# Patient Record
Sex: Female | Born: 1945
Health system: Southern US, Community
[De-identification: ages and names within clinical notes are randomized; demographics above are authoritative.]

## PROBLEM LIST (undated history)

## (undated) DIAGNOSIS — H9190 Unspecified hearing loss, unspecified ear: Secondary | ICD-10-CM

## (undated) DIAGNOSIS — H269 Unspecified cataract: Secondary | ICD-10-CM

## (undated) DIAGNOSIS — E785 Hyperlipidemia, unspecified: Secondary | ICD-10-CM

## (undated) DIAGNOSIS — M810 Age-related osteoporosis without current pathological fracture: Secondary | ICD-10-CM

## (undated) DIAGNOSIS — T7840XA Allergy, unspecified, initial encounter: Secondary | ICD-10-CM

## (undated) DIAGNOSIS — T8859XA Other complications of anesthesia, initial encounter: Secondary | ICD-10-CM

## (undated) DIAGNOSIS — M1990 Unspecified osteoarthritis, unspecified site: Secondary | ICD-10-CM

## (undated) HISTORY — PX: TUBAL LIGATION: SHX77

## (undated) HISTORY — DX: Unspecified cataract: H26.9

## (undated) HISTORY — DX: Allergy, unspecified, initial encounter: T78.40XA

## (undated) HISTORY — DX: Hyperlipidemia, unspecified: E78.5

## (undated) HISTORY — PX: TOTAL KNEE ARTHROPLASTY: SHX125

## (undated) HISTORY — PX: JOINT REPLACEMENT: SHX530

## (undated) HISTORY — PX: COLONOSCOPY: SHX174

## (undated) HISTORY — PX: EYE SURGERY: SHX253

## (undated) HISTORY — PX: CHOLECYSTECTOMY: SHX55

## (undated) HISTORY — PX: KNEE SURGERY: SHX244

---

## 1999-11-22 ENCOUNTER — Encounter: Admission: RE | Admit: 1999-11-22 | Discharge: 1999-11-22 | Payer: Self-pay | Admitting: Obstetrics and Gynecology

## 1999-11-22 ENCOUNTER — Encounter: Payer: Self-pay | Admitting: Obstetrics and Gynecology

## 2000-08-21 ENCOUNTER — Encounter: Payer: Self-pay | Admitting: Family Medicine

## 2000-08-21 ENCOUNTER — Encounter: Admission: RE | Admit: 2000-08-21 | Discharge: 2000-08-21 | Payer: Self-pay | Admitting: Family Medicine

## 2000-11-21 ENCOUNTER — Encounter: Payer: Self-pay | Admitting: Obstetrics and Gynecology

## 2000-11-21 ENCOUNTER — Encounter: Admission: RE | Admit: 2000-11-21 | Discharge: 2000-11-21 | Payer: Self-pay | Admitting: Obstetrics and Gynecology

## 2000-11-25 ENCOUNTER — Encounter: Admission: RE | Admit: 2000-11-25 | Discharge: 2000-11-25 | Payer: Self-pay | Admitting: Obstetrics and Gynecology

## 2000-11-25 ENCOUNTER — Encounter: Payer: Self-pay | Admitting: Obstetrics and Gynecology

## 2001-11-27 ENCOUNTER — Encounter: Payer: Self-pay | Admitting: Obstetrics and Gynecology

## 2001-11-27 ENCOUNTER — Encounter: Admission: RE | Admit: 2001-11-27 | Discharge: 2001-11-27 | Payer: Self-pay | Admitting: Obstetrics and Gynecology

## 2002-12-01 ENCOUNTER — Encounter: Payer: Self-pay | Admitting: Obstetrics and Gynecology

## 2002-12-01 ENCOUNTER — Encounter: Admission: RE | Admit: 2002-12-01 | Discharge: 2002-12-01 | Payer: Self-pay | Admitting: Obstetrics and Gynecology

## 2002-12-09 ENCOUNTER — Encounter: Payer: Self-pay | Admitting: Family Medicine

## 2002-12-09 ENCOUNTER — Encounter: Admission: RE | Admit: 2002-12-09 | Discharge: 2002-12-09 | Payer: Self-pay | Admitting: Family Medicine

## 2003-11-25 ENCOUNTER — Emergency Department (HOSPITAL_COMMUNITY): Admission: EM | Admit: 2003-11-25 | Discharge: 2003-11-25 | Payer: Self-pay | Admitting: Emergency Medicine

## 2004-01-11 ENCOUNTER — Encounter: Admission: RE | Admit: 2004-01-11 | Discharge: 2004-01-11 | Payer: Self-pay | Admitting: Obstetrics and Gynecology

## 2004-04-14 ENCOUNTER — Ambulatory Visit: Payer: Self-pay | Admitting: Family Medicine

## 2004-06-30 ENCOUNTER — Ambulatory Visit: Payer: Self-pay | Admitting: Family Medicine

## 2005-01-17 ENCOUNTER — Encounter: Admission: RE | Admit: 2005-01-17 | Discharge: 2005-01-17 | Payer: Self-pay | Admitting: Obstetrics and Gynecology

## 2005-03-15 ENCOUNTER — Ambulatory Visit: Payer: Self-pay | Admitting: Family Medicine

## 2005-07-27 ENCOUNTER — Ambulatory Visit: Payer: Self-pay | Admitting: Family Medicine

## 2005-11-30 ENCOUNTER — Ambulatory Visit: Payer: Self-pay | Admitting: Family Medicine

## 2006-01-18 ENCOUNTER — Encounter: Admission: RE | Admit: 2006-01-18 | Discharge: 2006-01-18 | Payer: Self-pay | Admitting: Obstetrics and Gynecology

## 2006-03-20 ENCOUNTER — Ambulatory Visit: Payer: Self-pay | Admitting: Family Medicine

## 2006-07-16 ENCOUNTER — Ambulatory Visit: Payer: Self-pay | Admitting: Family Medicine

## 2006-07-16 LAB — CONVERTED CEMR LAB
ALT: 15 units/L (ref 0–40)
AST: 17 units/L (ref 0–37)
Albumin: 3.5 g/dL (ref 3.5–5.2)
Alkaline Phosphatase: 42 units/L (ref 39–117)
BUN: 19 mg/dL (ref 6–23)
Basophils Absolute: 0 10*3/uL (ref 0.0–0.1)
Basophils Relative: 0.3 % (ref 0.0–1.0)
Bilirubin, Direct: 0.1 mg/dL (ref 0.0–0.3)
CO2: 28 meq/L (ref 19–32)
Calcium: 9.5 mg/dL (ref 8.4–10.5)
Chloride: 108 meq/L (ref 96–112)
Cholesterol: 190 mg/dL (ref 0–200)
Creatinine, Ser: 0.6 mg/dL (ref 0.4–1.2)
Eosinophils Absolute: 0.1 10*3/uL (ref 0.0–0.6)
Eosinophils Relative: 2.3 % (ref 0.0–5.0)
GFR calc Af Amer: 132 mL/min
GFR calc non Af Amer: 109 mL/min
Glucose, Bld: 97 mg/dL (ref 70–99)
HCT: 38 % (ref 36.0–46.0)
HDL: 54.1 mg/dL (ref >39.0)
Hemoglobin: 13.1 g/dL (ref 12.0–15.0)
LDL Cholesterol: 126 mg/dL — ABNORMAL HIGH (ref 0–99)
Lymphocytes Relative: 31.7 % (ref 12.0–46.0)
MCHC: 34.6 g/dL (ref 30.0–36.0)
MCV: 90.8 fL (ref 78.0–100.0)
Monocytes Absolute: 0.4 10*3/uL (ref 0.2–0.7)
Monocytes Relative: 8.8 % (ref 3.0–11.0)
Neutro Abs: 2.4 10*3/uL (ref 1.4–7.7)
Neutrophils Relative %: 56.9 % (ref 43.0–77.0)
Platelets: 235 10*3/uL (ref 150–400)
Potassium: 4.4 meq/L (ref 3.5–5.1)
RBC: 4.18 M/uL (ref 3.87–5.11)
RDW: 12.9 % (ref 11.5–14.6)
Sodium: 141 meq/L (ref 135–145)
TSH: 1.2 microintl units/mL (ref 0.35–5.50)
Total Bilirubin: 0.6 mg/dL (ref 0.3–1.2)
Total CHOL/HDL Ratio: 3.5
Total Protein: 6.3 g/dL (ref 6.0–8.3)
Triglycerides: 47 mg/dL (ref 0–149)
VLDL: 9 mg/dL (ref 0–40)
WBC: 4.2 10*3/uL — ABNORMAL LOW (ref 4.5–10.5)

## 2006-11-11 ENCOUNTER — Ambulatory Visit: Payer: Self-pay | Admitting: Family Medicine

## 2007-03-17 ENCOUNTER — Encounter: Admission: RE | Admit: 2007-03-17 | Discharge: 2007-03-17 | Payer: Self-pay | Admitting: Obstetrics and Gynecology

## 2007-07-21 ENCOUNTER — Ambulatory Visit: Payer: Self-pay | Admitting: Family Medicine

## 2007-07-25 LAB — CONVERTED CEMR LAB
ALT: 15 units/L (ref 0–35)
AST: 18 units/L (ref 0–37)
Albumin: 3.9 g/dL (ref 3.5–5.2)
Alkaline Phosphatase: 44 units/L (ref 39–117)
BUN: 16 mg/dL (ref 6–23)
Basophils Absolute: 0 10*3/uL (ref 0.0–0.1)
Basophils Relative: 0.5 % (ref 0.0–1.0)
Bilirubin, Direct: 0.1 mg/dL (ref 0.0–0.3)
CO2: 28 meq/L (ref 19–32)
Calcium: 10.2 mg/dL (ref 8.4–10.5)
Chloride: 109 meq/L (ref 96–112)
Cholesterol: 215 mg/dL (ref 0–200)
Creatinine, Ser: 0.8 mg/dL (ref 0.4–1.2)
Direct LDL: 132.3 mg/dL
Eosinophils Absolute: 0.1 10*3/uL (ref 0.0–0.6)
Eosinophils Relative: 2.5 % (ref 0.0–5.0)
GFR calc Af Amer: 94 mL/min
GFR calc non Af Amer: 78 mL/min
Glucose, Bld: 91 mg/dL (ref 70–99)
HCT: 38.2 % (ref 36.0–46.0)
HDL: 59.4 mg/dL (ref >39.0)
Hemoglobin: 12.8 g/dL (ref 12.0–15.0)
Lymphocytes Relative: 30.1 % (ref 12.0–46.0)
MCHC: 33.6 g/dL (ref 30.0–36.0)
MCV: 90.8 fL (ref 78.0–100.0)
Monocytes Absolute: 0.4 10*3/uL (ref 0.2–0.7)
Monocytes Relative: 8.3 % (ref 3.0–11.0)
Neutro Abs: 3.1 10*3/uL (ref 1.4–7.7)
Neutrophils Relative %: 58.6 % (ref 43.0–77.0)
Platelets: 244 10*3/uL (ref 150–400)
Potassium: 4.2 meq/L (ref 3.5–5.1)
RBC: 4.2 M/uL (ref 3.87–5.11)
RDW: 13.6 % (ref 11.5–14.6)
Sodium: 142 meq/L (ref 135–145)
TSH: 1.79 microintl units/mL (ref 0.35–5.50)
Total Bilirubin: 0.7 mg/dL (ref 0.3–1.2)
Total CHOL/HDL Ratio: 3.6
Total Protein: 7 g/dL (ref 6.0–8.3)
Triglycerides: 88 mg/dL (ref 0–149)
VLDL: 18 mg/dL (ref 0–40)
WBC: 5.1 10*3/uL (ref 4.5–10.5)

## 2007-11-03 ENCOUNTER — Ambulatory Visit: Payer: Self-pay | Admitting: Family Medicine

## 2007-11-05 ENCOUNTER — Telehealth (INDEPENDENT_AMBULATORY_CARE_PROVIDER_SITE_OTHER): Payer: Self-pay | Admitting: Internal Medicine

## 2008-03-11 ENCOUNTER — Ambulatory Visit: Payer: Self-pay | Admitting: Family Medicine

## 2008-03-16 ENCOUNTER — Telehealth (INDEPENDENT_AMBULATORY_CARE_PROVIDER_SITE_OTHER): Payer: Self-pay | Admitting: Internal Medicine

## 2008-03-18 ENCOUNTER — Encounter: Admission: RE | Admit: 2008-03-18 | Discharge: 2008-03-18 | Payer: Self-pay | Admitting: Obstetrics and Gynecology

## 2008-03-25 ENCOUNTER — Ambulatory Visit: Payer: Self-pay | Admitting: Family Medicine

## 2008-03-31 ENCOUNTER — Encounter: Admission: RE | Admit: 2008-03-31 | Discharge: 2008-03-31 | Payer: Self-pay | Admitting: Obstetrics and Gynecology

## 2008-04-12 ENCOUNTER — Ambulatory Visit: Payer: Self-pay | Admitting: Internal Medicine

## 2008-08-25 ENCOUNTER — Ambulatory Visit: Payer: Self-pay | Admitting: Family Medicine

## 2008-08-25 DIAGNOSIS — J309 Allergic rhinitis, unspecified: Secondary | ICD-10-CM | POA: Insufficient documentation

## 2008-08-26 LAB — CONVERTED CEMR LAB
BUN: 9 mg/dL (ref 6–23)
CO2: 29 meq/L (ref 19–32)
Calcium: 10 mg/dL (ref 8.4–10.5)
Chloride: 109 meq/L (ref 96–112)
Cholesterol: 218 mg/dL — ABNORMAL HIGH (ref 0–200)
Creatinine, Ser: 0.8 mg/dL (ref 0.4–1.2)
Direct LDL: 134.8 mg/dL
GFR calc non Af Amer: 77.37 mL/min (ref >60)
Glucose, Bld: 97 mg/dL (ref 70–99)
HDL: 48.7 mg/dL (ref >39.00)
Potassium: 4.4 meq/L (ref 3.5–5.1)
Sodium: 142 meq/L (ref 135–145)
TSH: 1.43 microintl units/mL (ref 0.35–5.50)
Total CHOL/HDL Ratio: 4
Triglycerides: 98 mg/dL (ref 0.0–149.0)
VLDL: 19.6 mg/dL (ref 0.0–40.0)

## 2008-12-07 ENCOUNTER — Ambulatory Visit: Payer: Self-pay | Admitting: Family Medicine

## 2009-03-24 ENCOUNTER — Encounter: Payer: Self-pay | Admitting: Family Medicine

## 2009-03-24 ENCOUNTER — Encounter: Admission: RE | Admit: 2009-03-24 | Discharge: 2009-03-24 | Payer: Self-pay | Admitting: Obstetrics and Gynecology

## 2009-09-11 LAB — CONVERTED CEMR LAB: Pap Smear: NORMAL

## 2010-03-09 ENCOUNTER — Ambulatory Visit: Payer: Self-pay | Admitting: Family Medicine

## 2010-03-14 LAB — CONVERTED CEMR LAB
Alkaline Phosphatase: 49 units/L (ref 39–117)
Basophils Absolute: 0 10*3/uL (ref 0.0–0.1)
Basophils Relative: 0.8 % (ref 0.0–3.0)
CO2: 27 meq/L (ref 19–32)
Calcium: 9.7 mg/dL (ref 8.4–10.5)
Chloride: 99 meq/L (ref 96–112)
Creatinine, Ser: 0.9 mg/dL (ref 0.4–1.2)
Direct LDL: 156.3 mg/dL
Eosinophils Absolute: 0.2 10*3/uL (ref 0.0–0.7)
Eosinophils Relative: 3.3 % (ref 0.0–5.0)
GFR calc non Af Amer: 69.88 mL/min (ref >60)
Glucose, Bld: 96 mg/dL (ref 70–99)
Hemoglobin: 13.5 g/dL (ref 12.0–15.0)
Lymphs Abs: 1.6 10*3/uL (ref 0.7–4.0)
MCV: 93 fL (ref 78.0–100.0)
Monocytes Relative: 8.3 % (ref 3.0–12.0)
Neutro Abs: 2.9 10*3/uL (ref 1.4–7.7)
Neutrophils Relative %: 56.7 % (ref 43.0–77.0)
RBC: 4.26 M/uL (ref 3.87–5.11)
Sodium: 134 meq/L — ABNORMAL LOW (ref 135–145)
Total CHOL/HDL Ratio: 5
WBC: 5.1 10*3/uL (ref 4.5–10.5)

## 2010-03-17 ENCOUNTER — Ambulatory Visit: Payer: Self-pay | Admitting: Family Medicine

## 2010-03-17 ENCOUNTER — Encounter: Payer: Self-pay | Admitting: Internal Medicine

## 2010-03-17 ENCOUNTER — Encounter (INDEPENDENT_AMBULATORY_CARE_PROVIDER_SITE_OTHER): Payer: Self-pay | Admitting: *Deleted

## 2010-03-17 DIAGNOSIS — M810 Age-related osteoporosis without current pathological fracture: Secondary | ICD-10-CM | POA: Insufficient documentation

## 2010-03-20 ENCOUNTER — Telehealth: Payer: Self-pay | Admitting: Internal Medicine

## 2010-03-27 ENCOUNTER — Encounter: Admission: RE | Admit: 2010-03-27 | Discharge: 2010-03-27 | Payer: Self-pay | Admitting: Family Medicine

## 2010-03-27 LAB — HM MAMMOGRAPHY

## 2010-04-14 ENCOUNTER — Ambulatory Visit: Payer: Self-pay | Admitting: Sports Medicine

## 2010-04-14 DIAGNOSIS — Q667 Congenital pes cavus, unspecified foot: Secondary | ICD-10-CM | POA: Insufficient documentation

## 2010-04-14 DIAGNOSIS — M7750 Other enthesopathy of unspecified foot: Secondary | ICD-10-CM | POA: Insufficient documentation

## 2010-04-14 DIAGNOSIS — E785 Hyperlipidemia, unspecified: Secondary | ICD-10-CM | POA: Insufficient documentation

## 2010-04-14 DIAGNOSIS — G5760 Lesion of plantar nerve, unspecified lower limb: Secondary | ICD-10-CM | POA: Insufficient documentation

## 2010-04-27 ENCOUNTER — Encounter (INDEPENDENT_AMBULATORY_CARE_PROVIDER_SITE_OTHER): Payer: Self-pay | Admitting: *Deleted

## 2010-04-28 ENCOUNTER — Ambulatory Visit: Payer: Self-pay | Admitting: Internal Medicine

## 2010-05-11 ENCOUNTER — Ambulatory Visit: Payer: Self-pay | Admitting: Internal Medicine

## 2010-05-22 ENCOUNTER — Ambulatory Visit
Admission: RE | Admit: 2010-05-22 | Discharge: 2010-05-22 | Payer: Self-pay | Source: Home / Self Care | Attending: Sports Medicine | Admitting: Sports Medicine

## 2010-06-04 ENCOUNTER — Encounter: Payer: Self-pay | Admitting: Obstetrics and Gynecology

## 2010-06-13 NOTE — Letter (Signed)
Summary: Generic Letter  [place] [facility]  [address] [place], [place] [address]   Phone: [phone]  Fax: [phone]    03/17/2010     INFINITI HOEFLING [address] [place], [place]  [address]    Dear Ms. ZEMAITIS,   Notify pt that last DXA was 03/2009 last year...Marland Kitchenper reading there has been a statistically INSIGNIFICANT decrease in bone density in in spine and hip.Marland KitchenMarland KitchenMarland KitchenI would recommend continuing fosamax and recheck agin every 2 years..so next in 2012 at [facility].      Sincerely,   Benny Lennert CMA (AAMA)

## 2010-06-13 NOTE — Assessment & Plan Note (Signed)
Summary: cpx and estab from billie/dlo   Vital Signs:  Patient profile:   65 year old female Height:      65 inches Weight:      135.0 pounds BMI:     22.55 Temp:     98.6 degrees F oral Pulse rate:   80 / minute Pulse rhythm:   regular BP sitting:   120 / 70  (left arm) Cuff size:   regular  Vitals Entered By: Benny Lennert CMA Duncan Dull) (March 17, 2010 8:48 AM)  History of Present Illness: Chief complaint chronic health management.    [place] Dr. Elana Alm  (GYN) last 09/2009  High cholesterol.. new development..LDL goal <130. Exercsie 4-5 times a week.   Osteopenia.. has been on fosamax for many years. Not sure when last one was done...will get copy. Last told fosamax may not be helping.Pt already notified per report.  to not use well water with it.  Takes calcium and vit D.  Weight bearing exercise.    Preventive Screening-Counseling & Management  Alcohol-Tobacco     Smoking Status: never  Caffeine-Diet-Exercise     Does Patient Exercise: yes      Drug Use:  no.    Problems Prior to Update: 1)  Allergic Rhinitis  (ICD-477.9) 2)  Dysfunction of Eustachian Tube  (ICD-381.81) 3)  Well Adult Exam  (ICD-V70.0) 4)  Degenerative Joint Disease, Knee  (ICD-715.96) 5)  Bronchitis, Acute With Bronchospasm  (ICD-466.0)  Current Medications (verified): 1)  Zantac 150 Mg  Tabs (Ranitidine Hcl) .... Take 1 Tablet By Mouth Two Times A Day As Needed 2)  Rhinocort Aqua 32 Mcg/act  Susp (Budesonide (Nasal)) .... As Needed 3)  Aspirin 81 Mg  Tbec (Aspirin) .... Take 1 Tablet By Mouth Once A Day 4)  Fosamax 70 Mg  Tabs (Alendronate Sodium) .Marland Kitchen.. 1 Tab Every Week 5)  Fish Oil   Oil (Fish Oil) .... Otc Taking Twice Daily 6)  Antihistamine Decongestant 2.5-60 Mg Tabs (Triprolidine-Pseudoephedrine) .... Otc As Directed. 7)  Aleve 220 Mg Tabs (Naproxen Sodium) .... One Tablet Twice Daily  Allergies: 1)  ! Pcn 2)  ! Percocet 3)  ! Vicodin (Hydrocodone-Acetaminophen)  Past  History:  Past medical, surgical, family and social histories (including risk factors) reviewed, and no changes noted (except as noted below).  Past Surgical History: arthroscopic L knee --sept ,08 Cholecystectomy  Family History: Reviewed history and no changes required. father: valvular disease age 69, high cholesterol mother: CVA, high cholesterol siblings: ? healthy uncle: colon cancer maternal aunt: breast cancer  Social History: Reviewed history from 07/21/2007 and no changes required. 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 CV:  Denies chest pain or discomfort. Resp:  Denies shortness of breath. GI:  Complains of constipation; denies abdominal pain, bloody stools, and diarrhea; Uses stool softners every few days..more constipation as she has gotten older.. GU:  Denies dysuria. Derm:  Denies lesion(s). Psych:  Denies anxiety and depression.  Physical Exam  General:  Well-developed,well-nourished,in no acute distress; alert,appropriate and cooperative throughout examination Ears:  External ear exam shows no significant lesions or deformities.  Otoscopic examination reveals clear canals, tympanic membranes are intact bilaterally without bulging, retraction, inflammation or discharge. Hearing is grossly normal bilaterally. Nose:  External nasal examination shows no deformity or inflammation. Nasal mucosa are  pink and moist without lesions or exudates. Mouth:  Oral mucosa and oropharynx without lesions or exudates.  Teeth in good repair. Neck:  no carotid bruit or thyromegaly no cervical or supraclavicular lymphadenopathy  Lungs:  Normal respiratory effort, chest expands symmetrically. Lungs are clear to auscultation, no crackles or  wheezes. Heart:  Normal rate and regular rhythm. S1 and S2 normal without gallop, murmur, click, rub or other extra sounds. Pulses:  R and L posterior tibial pulses are full and equal bilaterally  Extremities:  no edema  Skin:  Intact without suspicious lesions or rashes Psych:  Cognition and judgment appear intact. Alert and cooperative with normal attention span and concentration. No apparent delusions, illusions, hallucinations   Impression & Recommendations:  Problem # 1:  OSTEOPENIA (ICD-733.90) Will obtain bone densities.Marland Kitchendetermine when last one done..may be due..if decline was rapid...ma need to change fosamax to something else.  Her updated medication list for this problem includes:    Fosamax 70 Mg Tabs (Alendronate sodium) .Marland Kitchen... 1 tab every week  Problem # 2:  HYPERCHOLESTEROLEMIA (ICD-272.0) New diagnosis..Goal LDL <130..make lifestyle change. Discussed in detail..info given. Recheck fasting LIPIDS, AST, ALT  in 3 months Dx 272.0     Problem # 3:  SPECIAL SCREENING FOR MALIGNANT NEOPLASMS COLON (ICD-V76.51)  Orders: Gastroenterology Referral (GI)  Complete Medication List: 1)  Zantac 150 Mg Tabs (Ranitidine hcl) .... Take 1 tablet by mouth two times a day as needed 2)  Rhinocort Aqua 32 Mcg/act Susp (Budesonide (nasal)) .... As needed 3)  Aspirin 81 Mg Tbec (Aspirin) .... Take 1 tablet by mouth once a day 4)  Fosamax 70 Mg Tabs (Alendronate sodium) .Marland Kitchen.. 1 tab every week 5)  Fish Oil Oil (Fish oil) .... Otc taking twice daily 6)  Antihistamine Decongestant 2.5-60 Mg Tabs (Triprolidine-pseudoephedrine) .... Otc as directed. 7)  Aleve 220 Mg Tabs (Naproxen sodium) .... One tablet twice daily  Other Orders: Admin 1st Vaccine ([address]) Flu Vaccine 69yrs + ([address])  Patient Instructions: 1)  Referral Appointment Information 2)  Day/Date: 3)  Time: 4)  Place/MD: 5)  Address: 6)  Phone/Fax: 7)  Patient given appointment information. Information/Orders faxed/mailed.  8)   INcrease exercisie as able. 9)  Work on decreasing fatty foods..increase chicken, fish, lean pork. 10)  Limit fast food, processed food. 11)  Use more veggie fats in place of animal fats. 12)   Increase fiber in diet. 13)   Recheck fasting LIPIDS, AST, ALT  in 3 months Dx 272.0    14)  Schedule next CPX in 03/2011.   Orders Added: 1)  Admin 1st Vaccine [[address]] 2)  Flu Vaccine 40yrs + [[address]] 3)  Gastroenterology Referral [GI] 4)  Est. Patient Level IV [[address]]    Current Allergies (reviewed today): ! PCN ! PERCOCET ! VICODIN (HYDROCODONE-ACETAMINOPHEN)        Flu Vaccine Consent Questions     Do you have a history of severe allergic reactions to this vaccine? no    Any prior history of allergic reactions to egg and/or gelatin? no    Do you have a sensitivity to the preservative Thimersol? no    Do you have a past history of Guillan-Barre Syndrome? no    Do you currently have an acute febrile illness? no    Have you ever had a severe reaction to latex? no    Vaccine information given and explained to patient? yes    Are you currently pregnant? no    Lot Number:[id number]   Exp  Date:11/11/2010   Site Given  Left Deltoid IM .lbflu1  Last Flu Vaccine:  Fluvax 3+ (03/11/2008 8:35:16 AM) Flu Vaccine Result Date:  03/17/2010 Flu Vaccine Result:  given Flu Vaccine Next Due:  1 yr Flex Sig Next Due:  Not Indicated PAP Result Date:  09/11/2009 PAP Result:  normal PAP Next Due:  1 yr Mammogram Result Date:  03/14/2009 Mammogram Result:  normal Mammogram Next Due:  1 yr LAst DEXA       Past Surgical History:    Reviewed history from 07/21/2007 and no changes required:       arthroscopic L knee --sept ,08       Cholecystectomy

## 2010-06-13 NOTE — Letter (Signed)
Summary: Pre Visit Letter Revised  [facility]  [address] [place], [place] [address]   Phone: [phone]  Fax: [phone]        03/17/2010 MRN: [id number]  Yesenia Jones [address] [place], [place]  [address]             Procedure Date:  12-29 at 8am           Dr Dalene Carrow to the Gastroenterology Division at [facility].    You are scheduled to see a nurse for your pre-procedure visit on 04-28-10 at  11am on the 3rd floor at [facility], [address]  We ask that you try to arrive at our office 15 minutes prior to your appointment time to allow for check-in.  Please take a minute to review the attached form.  If you answer "Yes" to one or more of the questions on the first page, we ask that you call the person listed at your earliest opportunity.  If you answer "No" to all of the questions, please complete the rest of the form and bring it to your appointment.    Your nurse visit will consist of discussing your medical and surgical history, your immediate family medical history, and your medications.   If you are unable to list all of your medications on the form, please bring the medication bottles to your appointment and we will list them.  We will need to be aware of both prescribed and over the counter drugs.  We will need to know exact dosage information as well.    Please be prepared to read and sign documents such as consent forms, a financial agreement, and acknowledgement forms.  If necessary, and with your consent, a friend or relative is welcome to sit-in on the nurse visit with you.  Please bring your insurance card so that we may make a copy of it.  If your insurance requires a referral to see a specialist, please bring your referral form from your primary care physician.  No co-pay is required for this nurse visit.     If you cannot keep your appointment, please call [phone] to cancel or reschedule prior to your  appointment date.  This allows [place] the opportunity to schedule an appointment for another patient in need of care.    Thank you for choosing  Gastroenterology for your medical needs.  We appreciate the opportunity to care for you.  Please visit [place] at our website  to learn more about our practice.  Sincerely, The Gastroenterology Division

## 2010-06-13 NOTE — Assessment & Plan Note (Signed)
Summary: NP RT TOE PAIN (4TH)/MJD   Vital Signs:  Patient profile:   65 year old female BP sitting:   131 / 81  Vitals Entered By: Rochele Pages RN (April 14, 2010 11:55 AM)  History of Present Illness: 65 yo F here for Rt 3rd-4th toe burning pain/sensation x 1 year intermittently.  Notices more whenever she is walking on it more and with shoes on.  Can come on quickly when walking outside with intense pain when it happens and has to change shoes.  Marta Antu can get caught on sheets and hurts as well. Lt 4th toe started with similar sensation for last 1-2 months. Has noticed some calluses on bottom of 2nd toes b/l as well as medial on great toe. No h/o DM.  Allergies: 1)  ! Pcn 2)  ! Percocet 3)  ! Vicodin (Hydrocodone-Acetaminophen)  Past History:  Past Surgical History: Last updated: 03/17/2010 arthroscopic L knee --sept ,08 Cholecystectomy  Family History: Last updated: 03/17/2010 father: valvular disease age 35, high cholesterol mother: CVA, high cholesterol siblings: ? 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 Msk:  Rt Foot inspection and palpation reveals breakdown of the transverse arch and a drop of MT heads: 2-4 Abnormal callous is present: 2nd MT  head c/w classic Morton's callus TTP: 2-4 MT heads She has cavus foot.  Grossly + squeeze test with reproduction of her burning/shooting symptoms in 4th toe  Lt Foot inspection and palpation reveals breakdown of the transverse arch and a drop of MT heads: 2nd Abnormal callous is present: 2nd MT head TTP: none Cavus foot.  Mildly + squeeze test.  On both feet, she has mild long arch collapse upon standing and splaying of 1st/2nd toes worse on Rt than Lt.  Knees: FROM.  + genu varum  Hips: FROM. 4/5 strength on hip abd  Gait: fairly normal with no sig tilt at shoulders or pelvis.  Very mild pronation  leg lengths: Lt 84 cm, Rt 85 cm  reviewing insoles she got from [employer] market, fairly good soft insoles with arch support, but teardrop MT pad appears to far forward    Impression & Recommendations:  Problem # 1:  MORTON'S NEUROMA (ICD-355.6)  Has classic signs and symptoms - green insoles with b/l MT pads - because of excessive MT head drop on Rt 3rd and 4th toes, added extra neuroma pad adjacent to MT pad - pt tried prior to leaving and was comfortable with these - recommended wide toe shoes - f/u 1 month  Orders: Sports Insoles (L3510) Foot Orthosis ( Arch Strap/Heel Cup) [phone])  Problem # 2:  METATARSALGIA (ICD-726.70)  green insoles with MT pads as above for support  reviewed hip abd/rot exercises  f/u 1 month  Orders: Sports Insoles (L3510) Foot Orthosis ( Arch Strap/Heel Cup) (W0981)  Problem # 3:  TALIPES CAVUS (ICD-754.71)  Pes cavus place feet at high risk of injury if she decides to run.  Given  she is not big runner, will likely do fine with green sports insoles, but could be candidate in future for custom orthotics  Orders: Sports Insoles (L3510) Foot Orthosis ( Arch Strap/Heel Cup) [phone])  Complete Medication List: 1)  Zantac 150 Mg Tabs (Ranitidine hcl) .... Take 1 tablet by mouth two times a day as needed 2)  Rhinocort Aqua 32 Mcg/act Susp  (Budesonide (nasal)) .... As needed 3)  Aspirin 81 Mg Tbec (Aspirin) .... Take 1 tablet by mouth once a day 4)  Fosamax 70 Mg Tabs (Alendronate sodium) .Marland Kitchen.. 1 tab every week 5)  Fish Oil Oil (Fish oil) .... Otc taking twice daily 6)  Antihistamine Decongestant 2.5-60 Mg Tabs (Triprolidine-pseudoephedrine) .... Otc as directed. 7)  Aleve 220 Mg Tabs (Naproxen sodium) .... One tablet twice daily   Orders Added: 1)  New Patient Level III [[address]] 2)  Sports Insoles [L3510] 3)  Foot Orthosis ( Arch Strap/Heel Cup) [U9811]

## 2010-06-13 NOTE — Progress Notes (Signed)
Summary: previsit letter ?'s  Phone Note Call from Patient Call back at Home Phone [phone]   Caller: Patient Call For: Dr. Juanda Chance Reason for Call: Talk to Nurse Summary of Call: previsit letter ?'s Initial call taken by: Vallarie Mare,  March 20, 2010 8:25 AM  Follow-up for Phone Call        I have left a message for the patient to call back. Dottie Nelson-Smith CMA Duncan Dull)  March 20, 2010 10:47 AM   Patient would like to let physician know that she has had problems with general anesthesia (when she had her cholecystectomy). She had severe nausea but no other side effects. I have explained to patient that fortunately, we do not use general sedation but rather use conscious sedation so she should be fine to proceed with procedure as planned. I have advised her that I will make the physician aware of her concerns. Follow-up by: Lamona Curl CMA Duncan Dull),  March 20, 2010 2:16 PM  Additional Follow-up for Phone Call Additional follow up Details #1::        Thank You.  Additional Follow-up by: Hart Carwin MD,  March 20, 2010 3:41 PM

## 2010-06-15 NOTE — Miscellaneous (Signed)
Summary: [facility] PV  Clinical Lists Changes  Medications: Added new medication of MIRALAX   POWD (POLYETHYLENE GLYCOL 3350) As per prep  instructions. - Signed Added new medication of DULCOLAX 5 MG  TBEC (BISACODYL) Day before procedure take 2 at 3pm and 2 at 8pm. - Signed Added new medication of REGLAN 10 MG  TABS (METOCLOPRAMIDE HCL) As per prep instructions. - Signed Rx of MIRALAX   POWD (POLYETHYLENE GLYCOL 3350) As per prep  instructions.;  #255gm x 0;  Signed;  Entered by: Ezra Sites RN;  Authorized by: Hart Carwin MD;  Method used: [facility]  [address] [place], [place]  [address], Ph: [phone] or [phone], Fax: [phone] Rx of DULCOLAX 5 MG  TBEC (BISACODYL) Day before procedure take 2 at 3pm and 2 at 8pm.;  #4 x 0;  Signed;  Entered by: Ezra Sites RN;  Authorized by: Hart Carwin MD;  Method used: Electronically to [facility]  [address], [place], [place]  [address], Ph: [phone] or [phone], Fax: [phone] Rx of REGLAN 10 MG  TABS (METOCLOPRAMIDE HCL) As per prep instructions.;  #2 x 0;  Signed;  Entered by: Ezra Sites RN;  Authorized by: Hart Carwin MD;  Method used: [facility]  [address], [place], [place]  [address], Ph: [phone] or [phone], Fax: [phone] Allergies: Changed allergy or adverse reaction from VICODIN (HYDROCODONE-ACETAMINOPHEN) to VICODIN (HYDROCODONE-ACETAMINOPHEN)    Prescriptions: REGLAN 10 MG  TABS (METOCLOPRAMIDE HCL) As per prep instructions.  #2 x 0   Entered by:   Ezra Sites RN   Authorized by:   Hart Carwin MD   Signed by:   Ezra Sites RN on 04/28/2010   Method used:   [facility]ron[facility] to        [facility]  [address] (retail)       [address]       [place], [place]  [address]       Ph: [phone] or [phone]       Fax: [phone]   RxID:   [id number] DULCOLAX 5 MG  TBEC (BISACODYL) Day before  procedure take 2 at 3pm and 2 at 8pm.  #4 x 0   Entered by:   Ezra Sites RN   Authorized by:   Hart Carwin MD   Signed by:   Ezra Sites RN on 04/28/2010   Method used:   [facility]ron[facility] to        [facility]  [address] (retail)       [address]       [place], [place]  [address]       Ph: [phone] or [phone]       Fax: [phone]   RxID:   [id number] MIRALAX   POWD (POLYETHYLENE GLYCOL 3350) As per prep  instructions.  #255gm x 0   Entered by:   Ezra Sites RN   Authorized by:   Hart Carwin MD   Signed by:   Ezra Sites RN on 04/28/2010   Method used:   [facility]        [facility]  [address] (retail)       [address]       [place], [place]  [address]       Ph: [phone] or [phone]       Fax: [phone]   RxID:   [id number]

## 2010-06-15 NOTE — Procedures (Signed)
Summary: Colonoscopy  Patient: Yesenia Jones Note: All result statuses are Final unless otherwise noted.  Tests: (1) Colonoscopy (COL)   COL Colonoscopy           DONE     [facility]     [address]     [place], [place]  [address]           COLONOSCOPY PROCEDURE REPORT           PATIENT:  Yesenia, Jones  MR#:  [id number]     BIRTHDATE:  [birth year], 64 yrs. old  GENDER:  female     ENDOSCOPIST:  Hedwig Morton. Juanda Chance, MD     REF. BY:  Excell Seltzer, M.D.     PROCEDURE DATE:  05/11/2010     PROCEDURE:  Colonoscopy [address]     ASA CLASS:  Class I     INDICATIONS:  Routine Risk Screening     MEDICATIONS:   Versed 10 mg, Fentanyl 100 mcg           DESCRIPTION OF PROCEDURE:   After the risks benefits and     alternatives of the procedure were thoroughly explained, informed     consent was obtained.  Digital rectal exam was performed and     revealed no rectal masses.   The LB [id number] endoscope     was introduced through the anus and advanced to the cecum, which     was identified by both the appendix and ileocecal valve, without     limitations.  The quality of the prep was excellent, using     MiraLax.  The instrument was then slowly withdrawn as the colon     was fully examined.     <<PROCEDUREIMAGES>>           FINDINGS:  Mild diverticulosis was found (see image4). few shallow     diverticuli  Otherwise normal colonoscopy without other polyps,     masses, vascular ectasias, or inflammatory changes (see image5,     image3, image2, and image1).   Retroflexion was not performed.     The scope was then withdrawn from the patient and the procedure     completed.           COMPLICATIONS:  None     ENDOSCOPIC IMPRESSION:     1) Mild diverticulosis     2) Otherwise nl colonoscopy WMO     RECOMMENDATIONS:     1) high fiber diet     REPEAT EXAM:  In 10 year(s) for.           ______________________________     Hedwig Morton. Juanda Chance, MD           CC:           n.  eSIGNED:   Hedwig Morton. Brodie at 05/11/2010 08:44 AM           Carolan Clines, [id number]  Note: An exclamation mark (!) indicates a result that was not dispersed into the flowsheet. Document Creation Date: 05/11/2010 8:44 AM _______________________________________________________________________  (1) Order result status: Final Collection or observation date-time: 05/11/2010 08:34 Requested date-time:  Receipt date-time:  Reported date-time:  Referring Physician:   Ordering Physician: Lina Sar [phone]) Specimen Source:  Source: Launa Grill Order Number: [id number] Lab site:   Appended Document: Colonoscopy    Clinical Lists Changes  Observations: Added new observation of COLONNXTDUE: 04/2020 (05/11/2010 10:04) Added new observation of COLONOSCOPY: 05/11/10 (05/11/2010 10:04)

## 2010-06-15 NOTE — Assessment & Plan Note (Signed)
Summary: FU APPT/MJD   Vital Signs:  Patient profile:   65 year old female BP sitting:   109 / 77  Vitals Entered By: Lillia Pauls CMA (May 22, 2010 9:36 AM)  History of Present Illness: pt here today to f/u her R 4th toe pain, which she says is bout 80% better. pt still has some "sensations" from time-to-time. she has been having some issues with the ab/adduction exercises due to her arthritis going to take a 3 month trip with husband to see the [place]. using her inserts which are helpful and doing bike exercise twice a week.  now won't use shoes without inserts as she immediately gets foot pain without them   Current Medications (verified): 1)  Zantac 150 Mg  Tabs (Ranitidine Hcl) .... Take 1 Tablet By Mouth Two Times A Day As Needed 2)  Rhinocort Aqua 32 Mcg/act  Susp (Budesonide (Nasal)) .... As Needed 3)  Aspirin 81 Mg  Tbec (Aspirin) .... Take 1 Tablet By Mouth Once A Day 4)  Fosamax 70 Mg  Tabs (Alendronate Sodium) .Marland Kitchen.. 1 Tab Every Week 5)  Fish Oil   Oil (Fish Oil) .... Otc Taking Twice Daily 6)  Antihistamine Decongestant 2.5-60 Mg Tabs (Triprolidine-Pseudoephedrine) .... Otc As Directed. 7)  Aleve 220 Mg Tabs (Naproxen Sodium) .... One Tablet Twice Daily 8)  Miralax   Powd (Polyethylene Glycol 3350) .... As Per Prep  Instructions. 9)  Dulcolax 5 Mg  Tbec (Bisacodyl) .... Day Before Procedure Take 2 At 3pm and 2 At 8pm. 10)  Reglan 10 Mg  Tabs (Metoclopramide Hcl) .... As Per Prep Instructions.  Allergies (verified): 1)  ! Pcn 2)  ! Percocet 3)  ! Vicodin (Hydrocodone-Acetaminophen)  Review of Systems  The patient denies weight loss, decreased hearing, and syncope.    Physical Exam  General:  NAD, thin Msk:  hip strength abduction- weakness L >R feet- tarsal-metatarsal change more prominent on right than left, mortons callus bilaterally, collapse of transverse arch R>L  bunionettes callus over plantar surface of 5th toe   Impression &  Recommendations:  Problem # 1:  METATARSALGIA (ICD-726.70) Assessment Improved  improved with inserts and metatarsal pads.  wearing tennis shoes with inserts all the time.  advised may need to replace pads q 3 months.  may come in without appt.  will give extra sports insoles today  wear these for walking and activity  before taking long hiking trip consider custom orthotics  given additional MT pads to use in all shoes Orders: Foot Orthosis ( Arch Strap/Heel Cup) [id number])  Problem # 2:  MORTON'S NEUROMA (ICD-355.6) Assessment: Improved  still with sensations but the neuroma pad combined with the metatarsal pad has helped.  Orders: Foot Orthosis ( Arch Strap/Heel Cup) [id number])  Problem # 3:  hip weakness advised to continue exercises daily, may do standing exercises while sitting.  Bike 4 x / week.  Complete Medication List: 1)  Zantac 150 Mg Tabs (Ranitidine hcl) .... Take 1 tablet by mouth two times a day as needed 2)  Rhinocort Aqua 32 Mcg/act Susp (Budesonide (nasal)) .... As needed 3)  Aspirin 81 Mg Tbec (Aspirin) .... Take 1 tablet by mouth once a day 4)  Fosamax 70 Mg Tabs (Alendronate sodium) .Marland Kitchen.. 1 tab every week 5)  Fish Oil Oil (Fish oil) .... Otc taking twice daily 6)  Antihistamine Decongestant 2.5-60 Mg Tabs (Triprolidine-pseudoephedrine) .... Otc as directed. 7)  Aleve 220 Mg Tabs (Naproxen sodium) .... One tablet twice  daily 8)  Miralax Powd (Polyethylene glycol 3350) .... As per prep  instructions. 9)  Dulcolax 5 Mg Tbec (Bisacodyl) .... Day before procedure take 2 at 3pm and 2 at 8pm. 10)  Reglan 10 Mg Tabs (Metoclopramide hcl) .... As per prep instructions.   Orders Added: 1)  Est. Patient Level III [22025] 2)  Foot Orthosis ( Arch Strap/Heel Cup) [K2706]

## 2010-06-15 NOTE — Letter (Signed)
Summary: Miralax Instructions  [facility]  [address]   [place], [place] [address]   Phone: [phone]  Fax: [phone]       Yesenia Jones    June 22, 1945    MRN: [id number]       Procedure Day Dorna Bloom: Thursday, 05-11-10     Arrival Time: 7:30 a.m.     Procedure Time: 8:00 a.m.     Location of Procedure:                    x   [facility] (4th Floor)   PREPARATION FOR COLONOSCOPY WITH MIRALAX  Starting 5 days prior to your procedure 05-06-10 do not eat nuts, seeds, popcorn, corn, beans, peas,  salads, or any raw vegetables.  Do not take any fiber supplements (e.g. Metamucil, Citrucel, and Benefiber). ____________________________________________________________________________________________________   THE DAY BEFORE YOUR PROCEDURE         DATE:05-10-10  DAY: Wednesday  1   Drink clear liquids the entire day-NO SOLID FOOD  2   Do not drink anything colored red or purple.  Avoid juices with pulp.  No orange juice.  3   Drink at least 64 oz. (8 glasses) of fluid/clear liquids during the day to prevent dehydration and help the prep work efficiently.  CLEAR LIQUIDS INCLUDE: Water Jello Ice Popsicles Tea (sugar ok, no milk/cream) Powdered fruit flavored drinks Coffee (sugar ok, no milk/cream) Gatorade Juice: apple, white grape, white cranberry  Lemonade Clear bullion, consomm, broth Carbonated beverages (any kind) Strained chicken noodle soup Hard Candy  4   Mix the entire bottle of Miralax with 64 oz. of Gatorade/Powerade in the morning and put in the refrigerator to chill.  5   At 3:00 pm take 2 Dulcolax/Bisacodyl tablets.  6   At 4:30 pm take one Reglan/Metoclopramide tablet.  7  Starting at 5:00 pm drink one 8 oz glass of the Miralax mixture every 15-20 minutes until you have finished drinking the entire 64 oz.  You should finish drinking prep around 7:30 or 8:00 pm.  8   If you are nauseated, you may take the 2nd  Reglan/Metoclopramide tablet at 6:30 pm.        9    At 8:00 pm take 2 more DULCOLAX/Bisacodyl tablets.     THE DAY OF YOUR PROCEDURE      DATE: 05-11-10  DAY: Thursday  You may drink clear liquids until  6:00 a.m.  (2 HOURS BEFORE PROCEDURE).   MEDICATION INSTRUCTIONS  Unless otherwise instructed, you should take regular prescription medications with a small sip of water as early as possible the morning of your procedure.           OTHER INSTRUCTIONS  You will need a responsible adult at least 64 years of age to accompany you and drive you home.   This person must remain in the waiting room during your procedure.  Wear loose fitting clothing that is easily removed.  Leave jewelry and other valuables at home.  However, you may wish to bring a book to read or an iPod/MP3 player to listen to music as you wait for your procedure to start.  Remove all body piercing jewelry and leave at home.  Total time from sign-in until discharge is approximately 2-3 hours.  You should go home directly after your procedure and rest.  You can resume normal activities the day after your procedure.  The day of your procedure you should not:   Drive  Make legal decisions   Operate machinery   Drink alcohol   Return to work  You will receive specific instructions about eating, activities and medications before you leave.   The above instructions have been reviewed and explained to me by   Ezra Sites RN  April 28, 2010 11:06 AM   I fully understand and can verbalize these instructions _____________________________ Date _______

## 2010-06-20 ENCOUNTER — Encounter (INDEPENDENT_AMBULATORY_CARE_PROVIDER_SITE_OTHER): Payer: Self-pay | Admitting: *Deleted

## 2010-06-20 ENCOUNTER — Other Ambulatory Visit: Payer: Self-pay | Admitting: Family Medicine

## 2010-06-20 ENCOUNTER — Other Ambulatory Visit (INDEPENDENT_AMBULATORY_CARE_PROVIDER_SITE_OTHER): Payer: BC Managed Care – PPO

## 2010-06-20 DIAGNOSIS — E785 Hyperlipidemia, unspecified: Secondary | ICD-10-CM

## 2010-06-20 LAB — AST: AST: 16 U/L (ref 0–37)

## 2010-06-20 LAB — ALT: ALT: 14 U/L (ref 0–35)

## 2010-06-20 LAB — LIPID PANEL
Cholesterol: 208 mg/dL — ABNORMAL HIGH (ref 0–200)
HDL: 59.7 mg/dL (ref >39.00)
Total CHOL/HDL Ratio: 3
VLDL: 16.6 mg/dL (ref 0.0–40.0)

## 2010-07-25 ENCOUNTER — Encounter: Payer: Self-pay | Admitting: *Deleted

## 2011-01-03 ENCOUNTER — Other Ambulatory Visit: Payer: Self-pay | Admitting: Orthopedic Surgery

## 2011-01-03 ENCOUNTER — Encounter (HOSPITAL_COMMUNITY): Payer: BC Managed Care – PPO

## 2011-01-03 LAB — CBC
Hemoglobin: 13.5 g/dL (ref 12.0–15.0)
MCH: 30.7 pg (ref 26.0–34.0)
MCV: 92.3 fL (ref 78.0–100.0)
RBC: 4.4 MIL/uL (ref 3.87–5.11)

## 2011-01-03 LAB — COMPREHENSIVE METABOLIC PANEL
BUN: 14 mg/dL (ref 6–23)
CO2: 28 mEq/L (ref 19–32)
Calcium: 12.4 mg/dL — ABNORMAL HIGH (ref 8.4–10.5)
Creatinine, Ser: 0.78 mg/dL (ref 0.50–1.10)
GFR calc Af Amer: >60 mL/min (ref >60)
GFR calc non Af Amer: >60 mL/min (ref >60)
Glucose, Bld: 82 mg/dL (ref 70–99)

## 2011-01-03 LAB — URINALYSIS, ROUTINE W REFLEX MICROSCOPIC
Bilirubin Urine: NEGATIVE
Nitrite: NEGATIVE
Specific Gravity, Urine: 1.01 (ref 1.005–1.030)
Urobilinogen, UA: 0.2 mg/dL (ref 0.0–1.0)

## 2011-01-03 LAB — DIFFERENTIAL
Lymphs Abs: 1.8 10*3/uL (ref 0.7–4.0)
Monocytes Relative: 7 % (ref 3–12)
Neutro Abs: 4.4 10*3/uL (ref 1.7–7.7)
Neutrophils Relative %: 65 % (ref 43–77)

## 2011-01-03 LAB — PROTIME-INR: INR: 0.91 (ref 0.00–1.49)

## 2011-01-03 LAB — URINE MICROSCOPIC-ADD ON

## 2011-01-10 ENCOUNTER — Inpatient Hospital Stay (HOSPITAL_COMMUNITY)
Admission: RE | Admit: 2011-01-10 | Discharge: 2011-01-13 | DRG: 209 | Disposition: A | Discharge status: Home Health | Payer: BC Managed Care – PPO | Source: Ambulatory Visit | Attending: Orthopedic Surgery | Admitting: Orthopedic Surgery

## 2011-01-10 ENCOUNTER — Inpatient Hospital Stay (HOSPITAL_COMMUNITY): Payer: BC Managed Care – PPO

## 2011-01-10 DIAGNOSIS — M899 Disorder of bone, unspecified: Secondary | ICD-10-CM | POA: Diagnosis present

## 2011-01-10 DIAGNOSIS — Z01812 Encounter for preprocedural laboratory examination: Secondary | ICD-10-CM

## 2011-01-10 DIAGNOSIS — M1710 Unilateral primary osteoarthritis, unspecified knee: Principal | ICD-10-CM | POA: Diagnosis present

## 2011-01-10 LAB — ABO/RH: ABO/RH(D): A POS

## 2011-01-10 LAB — TYPE AND SCREEN: ABO/RH(D): A POS

## 2011-01-11 LAB — HEMOGLOBIN AND HEMATOCRIT, BLOOD: HCT: 31.6 % — ABNORMAL LOW (ref 36.0–46.0)

## 2011-01-11 LAB — PROTIME-INR
INR: 1.06 (ref 0.00–1.49)
Prothrombin Time: 14 seconds (ref 11.6–15.2)

## 2011-01-12 LAB — PROTIME-INR
INR: 1.36 (ref 0.00–1.49)
Prothrombin Time: 17 seconds — ABNORMAL HIGH (ref 11.6–15.2)

## 2011-01-12 LAB — HEMOGLOBIN AND HEMATOCRIT, BLOOD: HCT: 26.9 % — ABNORMAL LOW (ref 36.0–46.0)

## 2011-01-13 LAB — PROTIME-INR: INR: 1.46 (ref 0.00–1.49)

## 2011-01-13 LAB — HEMOGLOBIN AND HEMATOCRIT, BLOOD: Hemoglobin: 8 g/dL — ABNORMAL LOW (ref 12.0–15.0)

## 2011-01-16 NOTE — Op Note (Signed)
NAMEMACIE, BAUM               ACCOUNT NO.:  [id number]  MEDICAL RECORD NO.:  [id number]  LOCATION:  1528                         FACILITY:  [facility]  PHYSICIAN:  Georges Lynch. Jaxson Keener, M.D.DATE OF BIRTH:  01/25/1946  DATE OF PROCEDURE:  01/10/2011 DATE OF DISCHARGE:                              OPERATIVE REPORT   SURGEON:  Georges Lynch. Darrelyn Hillock, M.D.  ASSISTANT:  Jaquelyn Bitter. Chabon, P.A.  PREOPERATIVE DIAGNOSIS:  Severe degenerative arthritis of the left knee actually with bone-on-bone, and she failed conservative treatment.  Prior to surgery, we went through the appropriate time-out.  She also had previous arthroscopic surgery put down on left knee.  Prior to surgery, we had the appropriate time-out.  In fact, the left leg was marked in the holding area preop.  Once she was taken to the operating room, time-out was carried out after the sterile prep and draping.  We then exsanguinated the leg with an Esmarch tourniquet and was elevated to 325 mmHg.  Her leg was placed in a Tree surgeon.  The knee was flexed.  An incision was made over the anterior aspect of the left knee. Bleeders were identified and cauterized.  Two flaps were created.  I then carried out a median parapatellar approach.  Following that, the spurs were removed.  I did for the femur and tibia, then medial and lateral meniscectomies, incised the anterior and posterior cruciate ligaments.  An initial drill hole then was made in the intercondylar notch.  I removed 12 mm thickness off the distal femur.  Following that, we sized the femur to be a size 3, left femur.  We then did our anterior- posterior chamfer cuts for a size 3, left femur.  Following that, we prepared the tibia in the usual fashion.  We utilized the intramedullary guides, removed 6 mm thickness off the affected medial side that she had really bone-on-bone.  Following that, we went through the appropriate alignment to make sure we had an excellent  straight cut which we did. We utilized the alignment rod for the tibia.  After this was done, we then utilized our spacer guides for flexion and extension and finally selected a 15.5 mm thickness insert.  Following that, we then cut our keel, cut out of the tibial plateau.  We then went on and notch cut out of the distal femur.  We inserted our trial components and held those in place.  We first tried a 12.5 mm thickness insert, finally felt that she was more stable with a 15 mm thickness insert and we preferred that since she was 64 and active.  At this time, we then did a resurfacing procedure on the patella for a size 35 mm patella.  The 3 drill holes were made in the undersurface of the patella for placement of the patellar dome.  All trial components were removed.  We thoroughly water picked out the knee and we noted that she had some marked varicosities in the posterolateral aspect of the joint, we carefully cauterized those.  She also had some varicosities within the joint laterally.  This was an unusual appearance, but we made sure that we had those properly  cauterized.  They were all more anterior, there was nothing deep in the popliteal space.  This was definitely venous-type bleeding we had. Following that, we then inserted our cement and cemented all 3 components simultaneously.  Prosthesis was held in place until the cement was dry, removed all loose pieces of cement.  Thoroughly water picked out the knee.  Before inserting our permanent tibial component, I let the tourniquet down to reinspect those posterolateral varicosities. We had excellent hemostasis from those.  We just had a lot of superficial venous bleeding.  Following that, we then irrigated out the knee and inserted our permanent prosthesis that was our rotating platform prosthesis.  I also injected 10 cc of FloSeal into the surrounding soft tissue area.  We then closed the wound layers in usual fashion over  Hemovac drain.  Subcu was closed with 0 Vicryl, skin with metal staples, and sterile Neosporin dressing was applied.  Patient had clindamycin 600 mg IV preop.          ______________________________ Georges Lynch. Darrelyn Hillock, M.D.     RAG/MEDQ  D:  01/10/2011  T:  01/11/2011  Job:  [id number]  Electronically Signed by Ranee Gosselin M.D. on 01/16/2011 07:49:26 AM

## 2011-01-25 ENCOUNTER — Telehealth: Payer: Self-pay | Admitting: *Deleted

## 2011-01-26 ENCOUNTER — Telehealth: Payer: Self-pay | Admitting: *Deleted

## 2011-01-26 NOTE — Telephone Encounter (Signed)
Therapist states pt is currently getting in home physical therapy after a TNR but she will be changing to outpatient, so [facility] will no longer be checking pt's PT and INR, ordered by her ortho.  She is asking if you will take over managing her coumadin.

## 2011-01-26 NOTE — Telephone Encounter (Signed)
Nurse says patient may have made arrangements to have this done thru dr Juliene Pina. If not patient will call Monday to set this up

## 2011-01-26 NOTE — Telephone Encounter (Signed)
She can set up in our coumadin clinic.Marland Kitchen Let terri/tasha know. Please call ortho to find out length of coumadin treatment needed.

## 2011-01-31 NOTE — Telephone Encounter (Signed)
Chart opened in error

## 2011-02-03 NOTE — H&P (Signed)
  Yesenia Jones, SEVERS               ACCOUNT NO.:  [id number]  MEDICAL RECORD NO.:  [id number]  LOCATION:                                 FACILITY:  PHYSICIAN:  Georges Lynch. Lorry Anastasi, M.D.DATE OF BIRTH:  [birth year]  DATE OF ADMISSION: DATE OF DISCHARGE:                             HISTORY & PHYSICAL   DATE OF SURGERY:  January 10, 2011.  ADMISSION DIAGNOSIS:  Left knee osteoarthritis.  HISTORY OF PRESENT ILLNESS:  This is a 65 year old lady with a history of osteoarthritis of the left knee with failure of conservative treatment to manage her symptoms.  After discussion of treatments, benefits, risks, and options, the patient now scheduled for total knee arthroplasty of the left knee.  The patient will be going home after surgery.  Questions were invited and answered.  PAST MEDICAL HISTORY:  DRUG ALLERGIES TO PENICILLIN WITH ITCHING AND PERCOCET AND VICODIN WITH NAUSEA.  CURRENT MEDICATIONS:  Fosamax once a week, calcium 600 mg daily, fish oil 1200 mg daily, aspirin 81 mg daily, vitamin D3 1000 International Units daily, plant steroids 400 mg daily, psyllium fiber 6 a day, stool softener 2-3 a day, and Centrum Silver daily.  MEDICAL ILLNESSES:  Include diverticulitis and osteopenia.  PREVIOUS SURGERIES:  Include tubal ligation, cholecystectomy, and arthroscopic knee surgery.  FAMILY HISTORY:  Positive for coronary artery disease and stroke.  SOCIAL HISTORY:  The patient is married.  She is retired.  She does not smoke and drinks one glass one a day.  REVIEW OF SYSTEMS:  CENTRAL NERVOUS SYSTEM:  Positive for history of polio in 1955 with no sequelae.  PULMONARY:  Negative for shortness breath, PND, or orthopnea.  CARDIOVASCULAR:  Negative chest pain, palpitation.  GI: Positive for history of diverticulosis and cholecystitis.  GU: Negative for urinary tract difficulties. MUSCULOSKELETAL:  Positive as in HPI.  PHYSICAL EXAMINATION:  VITAL SIGNS:  BP 112/87, respirations 16,  pulse 88 and regular. GENERAL APPEARANCE:  This is a well-developed, well-nourished lady in no acute distress. HEENT: Head normocephalic.  Nose patent.  Ears patent.  Pupils equal, round, and reactive to light.  Throat without injection. NECK: Supple without adenopathy.  Carotids 2+ without bruit. CHEST: Clear to auscultation.  No rales or rhonchi. HEART:  Regular rate and rhythm at 88 beats per minute without murmur. ABDOMEN: Soft.  Active bowel sounds.  No masses or organomegaly. NEUROLOGIC:  The patient alert and oriented to time, place, person. Cranial nerves II-XII grossly intact. EXTREMITIES:  Shows left knee with painful range of motion, pain with weightbearing, 0-125 degrees range of motion.  Neurovascular status intact.  IMPRESSION:  Left knee osteoarthritis.  PLAN:  Total knee arthroplasty, left knee.     Jaquelyn Bitter. Chabon, P.A.   ______________________________ Georges Lynch Darrelyn Hillock, M.D.    SJC/MEDQ  D:  12/26/2010  T:  12/27/2010  Job:  [id number]  Electronically Signed by Jodene Nam P.A. on 01/31/2011 02:18:23 PM Electronically Signed by Ranee Gosselin M.D. on 02/03/2011 09:05:52 AM

## 2011-02-12 NOTE — Discharge Summary (Signed)
  Yesenia Jones               ACCOUNT NO.:  [id number]  MEDICAL RECORD NO.:  [id number]  LOCATION:  1528                         FACILITY:  [facility]  PHYSICIAN:  Georges Lynch. Etty Isaac, Yesenia JonesDATE OF BIRTH:  [birth year]  DATE OF ADMISSION:  01/10/2011 DATE OF DISCHARGE:  01/13/2011                              DISCHARGE SUMMARY   She was taken to surgery on January 10, 2011.  At this time, we did a total knee arthroplasty.  Postop on January 11, 2011, she was doing fine. Her Hemovac was discontinued.  The only issue was nausea.  Her circulation was intact.  The dressing was dry.  On January 12, 2011, her INR was 1.36, hemoglobin 9.2.  She still was not quite proficient yet in her physical therapy.  Her dressing was changed and wound looked good. I told her I plan to let her go on Sunday on January 13, 2011.  She was seen by my associate and her INR was 1.46.  She was discharged to be followed in my office.  OPERATIVE PROCEDURE:  Left total knee arthroplasty.  PERTINENT LAB STUDIES:  She had postop x-rays in the recovery room that looked excellent.  On August 30, her hemoglobin was 10.5, hematocrit 31. On January 13, 2011, her hemoglobin was 8, hematocrit 23.  On August 30, her PT was 14, INR 1.06.  On September 1, her PT was 18 and her INR was 1.46.  Another highlight is this is the only lab that is available on the computer.  Her initial lab not shown on the computer.  That was all reviewed by me in the hospital and was fine.  DISCHARGE DIAGNOSIS:  Left total knee arthroplasty secondary to severe arthritis, left knee.  DISCHARGE MEDICATIONS:  Were all listed and typed up in the discharge manager.  DISCHARGE INSTRUCTIONS: 1. She will ambulate full weightbearing with a walker at home. 2. Her INR will be managed by the home health and issues at home.  As     far as her Coumadin, she will be discharged on Coumadin. 3. She will see me in the office in 2 weeks from the day of  surgery.     Also at this time, her discharge instructions included me to see     her before 2 weeks if there are any major issues with fever, severe     calf pain etc.  DISCHARGE CONDITION:  Improved.          ______________________________ Georges Lynch Yesenia Jones, M.D.     RAG/MEDQ  D:  02/03/2011  T:  02/03/2011  Job:  [id number]  Electronically Signed by Ranee Gosselin M.D. on 02/12/2011 08:12:16 PM

## 2011-02-26 ENCOUNTER — Other Ambulatory Visit: Payer: Self-pay | Admitting: Family Medicine

## 2011-02-26 DIAGNOSIS — Z1231 Encounter for screening mammogram for malignant neoplasm of breast: Secondary | ICD-10-CM

## 2011-03-17 ENCOUNTER — Telehealth: Payer: Self-pay | Admitting: Family Medicine

## 2011-03-17 DIAGNOSIS — M949 Disorder of cartilage, unspecified: Secondary | ICD-10-CM

## 2011-03-17 DIAGNOSIS — E785 Hyperlipidemia, unspecified: Secondary | ICD-10-CM

## 2011-03-17 DIAGNOSIS — M899 Disorder of bone, unspecified: Secondary | ICD-10-CM

## 2011-03-17 DIAGNOSIS — D649 Anemia, unspecified: Secondary | ICD-10-CM

## 2011-03-17 NOTE — Telephone Encounter (Signed)
Message copied by Excell Seltzer on Sat Mar 17, 2011  9:12 AM ------      Message from: [name]      Created: Fri Mar 16, 2011 10:14 AM      Regarding: Mon labs,11.5       Patient is scheduled for CPX labs, please order future labs, Thanks , Camelia Eng

## 2011-03-19 ENCOUNTER — Other Ambulatory Visit (INDEPENDENT_AMBULATORY_CARE_PROVIDER_SITE_OTHER): Payer: BC Managed Care – PPO

## 2011-03-19 DIAGNOSIS — M899 Disorder of bone, unspecified: Secondary | ICD-10-CM

## 2011-03-19 DIAGNOSIS — M949 Disorder of cartilage, unspecified: Secondary | ICD-10-CM

## 2011-03-19 DIAGNOSIS — D649 Anemia, unspecified: Secondary | ICD-10-CM

## 2011-03-19 DIAGNOSIS — E785 Hyperlipidemia, unspecified: Secondary | ICD-10-CM

## 2011-03-19 LAB — COMPREHENSIVE METABOLIC PANEL
ALT: 13 U/L (ref 0–35)
AST: 19 U/L (ref 0–37)
Alkaline Phosphatase: 45 U/L (ref 39–117)
BUN: 11 mg/dL (ref 6–23)
Creatinine, Ser: 0.8 mg/dL (ref 0.4–1.2)

## 2011-03-19 LAB — CBC WITH DIFFERENTIAL/PLATELET
Basophils Relative: 0.8 % (ref 0.0–3.0)
Eosinophils Relative: 1.9 % (ref 0.0–5.0)
HCT: 38.7 % (ref 36.0–46.0)
Hemoglobin: 12.7 g/dL (ref 12.0–15.0)
Lymphs Abs: 1.3 10*3/uL (ref 0.7–4.0)
MCHC: 32.8 g/dL (ref 30.0–36.0)
MCV: 89.3 fl (ref 78.0–100.0)
Monocytes Absolute: 0.3 10*3/uL (ref 0.1–1.0)
Neutro Abs: 3.5 10*3/uL (ref 1.4–7.7)
RBC: 4.33 Mil/uL (ref 3.87–5.11)
WBC: 5.3 10*3/uL (ref 4.5–10.5)

## 2011-03-19 LAB — LIPID PANEL
Cholesterol: 218 mg/dL — ABNORMAL HIGH (ref 0–200)
HDL: 60.6 mg/dL (ref >39.00)
Total CHOL/HDL Ratio: 4
VLDL: 21.4 mg/dL (ref 0.0–40.0)

## 2011-03-22 ENCOUNTER — Encounter: Payer: Self-pay | Admitting: Family Medicine

## 2011-03-23 ENCOUNTER — Ambulatory Visit (INDEPENDENT_AMBULATORY_CARE_PROVIDER_SITE_OTHER): Payer: BC Managed Care – PPO | Admitting: Family Medicine

## 2011-03-23 ENCOUNTER — Encounter: Payer: Self-pay | Admitting: Family Medicine

## 2011-03-23 VITALS — BP 110/70 | HR 86 | Temp 98.8°F | Ht 66.5 in | Wt 115.8 lb

## 2011-03-23 DIAGNOSIS — Z0000 Encounter for general adult medical examination without abnormal findings: Secondary | ICD-10-CM

## 2011-03-23 DIAGNOSIS — R634 Abnormal weight loss: Secondary | ICD-10-CM

## 2011-03-23 DIAGNOSIS — M899 Disorder of bone, unspecified: Secondary | ICD-10-CM

## 2011-03-23 DIAGNOSIS — Z01419 Encounter for gynecological examination (general) (routine) without abnormal findings: Secondary | ICD-10-CM

## 2011-03-23 DIAGNOSIS — E785 Hyperlipidemia, unspecified: Secondary | ICD-10-CM

## 2011-03-23 DIAGNOSIS — Z23 Encounter for immunization: Secondary | ICD-10-CM

## 2011-03-23 DIAGNOSIS — M949 Disorder of cartilage, unspecified: Secondary | ICD-10-CM

## 2011-03-23 NOTE — Assessment & Plan Note (Signed)
Counseled on healthy eating habits. Recheck in 1 year.

## 2011-03-23 NOTE — Patient Instructions (Addendum)
Get back on track with regular healthy diet, make sure to eat 3 meals a day with snacks in between. Can try supplement like Ensure. Return to regular exercise. Call if continued weight gain or other symptoms in 1-2 months. Consider returning for weight check in 3-6 months. Call if stress worsens or any depression or anxiety.

## 2011-03-23 NOTE — Assessment & Plan Note (Signed)
Stoip fosamax in 5 onths when med pout and recheck  DXA next year  Or 1 year after stopping.

## 2011-03-23 NOTE — Progress Notes (Signed)
Subjective:    Patient ID: Yesenia Jones, female    DOB: 08/26/1945, 65 y.o.   MRN: [id number]  HPI  The patient is here for annual wellness exam and preventative care.    Elevated Cholesterol: On no medicaitons.Diet compliance: Not sticking to diet due to eating out with recent surgery. Exercise: Going to gym due to total knee repacement 10 weeks ago. Continuing rehabs Other complaints:  She has noted weight loss since summer. 20 lbs in last year. Had been working some last year on healthy diet. She was having decreased appetitie due to pain in knee...Marland Kitchen10 lbs lost with the pain. Was on percocet also causing nausea. Appetite is coming back some but not great. Some fatigue. Some stress due to family health and separation isues.  Now on ultracet.       Review of Systems  Constitutional: Positive for activity change, appetite change and unexpected weight change. Negative for fever, chills and diaphoresis.  HENT: Negative for ear pain, congestion, sore throat, sneezing, trouble swallowing and sinus pressure.   Eyes: Negative for pain and itching.  Respiratory: Negative for cough, shortness of breath and wheezing.   Cardiovascular: Negative for chest pain, palpitations and leg swelling.  Gastrointestinal: Negative for nausea, abdominal pain, diarrhea, constipation and blood in stool.  Genitourinary: Negative for dysuria, hematuria, vaginal discharge, difficulty urinating and menstrual problem.  Skin: Negative for rash.  Neurological: Negative for syncope, weakness, light-headedness, numbness and headaches.  Psychiatric/Behavioral: Negative for confusion and dysphoric mood. The patient is not nervous/anxious.        Objective:   Physical Exam  Constitutional: Vital signs are normal. She appears well-developed and well-nourished. She is cooperative.  Non-toxic appearance. She does not appear ill. No distress.       Thin female in NAd  HENT:  Head: Normocephalic.  Right Ear:  Hearing, tympanic membrane, external ear and ear canal normal.  Left Ear: Hearing, tympanic membrane, external ear and ear canal normal.  Nose: Nose normal.  Eyes: Conjunctivae, EOM and lids are normal. Pupils are equal, round, and reactive to light. No foreign bodies found.  Neck: Trachea normal and normal range of motion. Neck supple. Carotid bruit is not present. No mass and no thyromegaly present.  Cardiovascular: Normal rate, regular rhythm, S1 normal, S2 normal, normal heart sounds and intact distal pulses.  Exam reveals no gallop.   No murmur heard. Pulmonary/Chest: Effort normal and breath sounds normal. No respiratory distress. She has no wheezes. She has no rhonchi. She has no rales.  Abdominal: Soft. Normal appearance and bowel sounds are normal. She exhibits no distension, no fluid wave, no abdominal bruit and no mass. There is no hepatosplenomegaly. There is no tenderness. There is no rebound, no guarding and no CVA tenderness. No hernia.  Genitourinary: Rectum normal, vagina normal and uterus normal. No breast swelling, tenderness, discharge or bleeding. Pelvic exam was performed with patient prone. There is no rash, tenderness or lesion on the right labia. There is no rash, tenderness or lesion on the left labia. Uterus is not enlarged and not tender. Right adnexum displays no mass, no tenderness and no fullness. Left adnexum displays no mass, no tenderness and no fullness.       Bladder palpated normally  Lymphadenopathy:    She has no cervical adenopathy.    She has no axillary adenopathy.  Neurological: She is alert. She has normal strength. No cranial nerve deficit or sensory deficit.  Skin: Skin is warm, dry and intact. No  rash noted.  Psychiatric: Her speech is normal and behavior is normal. Judgment normal. Her mood appears not anxious. Cognition and memory are normal. She does not exhibit a depressed mood.          Assessment & Plan:  Complete Physical Exam: The  patient's preventative maintenance and recommended screening tests for an annual wellness exam were reviewed in full today. Brought up to date unless services declined.  Counselled on the importance of diet, exercise, and its role in overall health and mortality. The patient's FH and SH was reviewed, including their home life, tobacco status, and drug and alcohol status.   Vaccines: Up to date with TD and shingles vaccine. Given flu vaccine today. She also thinks may have had PNA vaccine in 2009. Colon: 04/2010 no polyps.. Repeat in 10 years. Dr. Juanda Chance. Mammo:Last 03/2010, nml.. Has appt next week set up. DXA: osteopenia 02/2009 on fosamax (on for 5-6 years, never diagnosed with osteoporosis)...  Will plan to stop fosamax when ran out of med in 5 months... Recheck DXA in 1 year. PAP/DVE: Previously done at [facility], but they retired. Last  PAP done nml 2011, all previous were normal 3 in a row,has history of abnormal pap and crypothrapy 20 years ago, DVE performed today, no pap done.

## 2011-03-23 NOTE — Assessment & Plan Note (Signed)
May be due to med SE, recent surgery, knee pain issues and stress. Work on increasing intake, supplement.. If not improving consider further eval such as TSH etc.

## 2011-03-29 ENCOUNTER — Ambulatory Visit
Admission: RE | Admit: 2011-03-29 | Discharge: 2011-03-29 | Disposition: A | Discharge status: Home / Self Care | Payer: BC Managed Care – PPO | Source: Ambulatory Visit | Attending: Family Medicine | Admitting: Family Medicine

## 2011-03-29 DIAGNOSIS — Z1231 Encounter for screening mammogram for malignant neoplasm of breast: Secondary | ICD-10-CM

## 2011-04-17 ENCOUNTER — Ambulatory Visit: Payer: BC Managed Care – PPO | Admitting: Sports Medicine

## 2012-01-07 ENCOUNTER — Ambulatory Visit (INDEPENDENT_AMBULATORY_CARE_PROVIDER_SITE_OTHER): Payer: BC Managed Care – PPO | Admitting: Sports Medicine

## 2012-01-07 ENCOUNTER — Encounter: Payer: Self-pay | Admitting: Sports Medicine

## 2012-01-07 VITALS — BP 123/88 | HR 96 | Ht 66.0 in | Wt 123.0 lb

## 2012-01-07 DIAGNOSIS — M1710 Unilateral primary osteoarthritis, unspecified knee: Secondary | ICD-10-CM

## 2012-01-07 DIAGNOSIS — IMO0002 Reserved for concepts with insufficient information to code with codable children: Secondary | ICD-10-CM

## 2012-01-07 NOTE — Patient Instructions (Addendum)
Use compression sleeve for standing or walking a lot For elliptical use during or just after to keep out swelling Ice the anterior knee area since you are still warm and somewhat swollen  Try taking Vit B6 50 mgm daily for nerve irritation  I think you may want to try a custom orthotic  Call [employer] customer service - tell them you have chronic foot pain and knee replacement and you have been in sports insoles for years but need custom orthotics as these no longer control pain

## 2012-01-07 NOTE — Assessment & Plan Note (Signed)
Now S/P left TKR  I wonder about a reactive process on left knee with warmth  Peroneal neuropathic sxs  Try vit B6  Use compression sleeve to limit swelling  Ice at end of day  I think she should get custom orthotics to lessen impact on both knees as I do not think sports insoles are enough

## 2012-01-07 NOTE — Progress Notes (Signed)
  Subjective:    Patient ID: Yesenia Jones, female    DOB: 07/10/1945, 66 y.o.   MRN: [id number]  HPI Had left knee replaced 01/10/11 by Dr Darrelyn Hillock Now numbness on outside of knee Also some on top of foot and inside She does not feel that the knee is unstable  Irritation of lat knee to foot does limit her walking  Review of Systems     Objective:   Physical Exam  NAD  Left knee with midline scar Left knee is mod warm compared to RT Mild swelling left Numbness along lat fibular head and lat tib plateau No pain on fib head pressure  Knee seems stable to A/P and lat/ med stress  MSK US Soft tissue swelling along lat fib head Could not viz common peroneal n Slight effusion with more fluid lateral Patellar and quad tendons intact  Feet show cavus change/ using sports insole      Assessment & Plan:

## 2012-01-15 DIAGNOSIS — IMO0002 Reserved for concepts with insufficient information to code with codable children: Secondary | ICD-10-CM | POA: Diagnosis not present

## 2012-01-15 DIAGNOSIS — M1710 Unilateral primary osteoarthritis, unspecified knee: Secondary | ICD-10-CM | POA: Diagnosis not present

## 2012-01-31 ENCOUNTER — Ambulatory Visit (INDEPENDENT_AMBULATORY_CARE_PROVIDER_SITE_OTHER): Payer: Medicare Other | Admitting: Sports Medicine

## 2012-01-31 ENCOUNTER — Encounter: Payer: Self-pay | Admitting: Sports Medicine

## 2012-01-31 VITALS — BP 113/75 | HR 73 | Ht 66.0 in | Wt 123.0 lb

## 2012-01-31 DIAGNOSIS — G5760 Lesion of plantar nerve, unspecified lower limb: Secondary | ICD-10-CM

## 2012-01-31 DIAGNOSIS — Q667 Congenital pes cavus, unspecified foot: Secondary | ICD-10-CM | POA: Diagnosis not present

## 2012-01-31 DIAGNOSIS — IMO0002 Reserved for concepts with insufficient information to code with codable children: Secondary | ICD-10-CM | POA: Diagnosis not present

## 2012-01-31 DIAGNOSIS — M1710 Unilateral primary osteoarthritis, unspecified knee: Secondary | ICD-10-CM | POA: Diagnosis not present

## 2012-01-31 DIAGNOSIS — M7750 Other enthesopathy of unspecified foot: Secondary | ICD-10-CM

## 2012-01-31 NOTE — Assessment & Plan Note (Signed)
Neuroma pad added to one side

## 2012-01-31 NOTE — Assessment & Plan Note (Signed)
Over time this was probably a contributing factor to her knee arthritis and for her knee replacement on the left  Patient was fitted for a : standard, cushioned, semi-rigid orthotic. The orthotic was heated and afterward the patient stood on the orthotic blank positioned on the orthotic stand. The patient was positioned in subtalar neutral position and 10 degrees of ankle dorsiflexion in a weight bearing stance. After completion of molding, a stable base was applied to the orthotic blank. The blank was ground to a stable position for weight bearing. Size: 8 red EVA (cut down to a 7.5) Base: blue EVA Posting: MT pads Additional orthotic padding: neuroma pad  Time 40 mins

## 2012-01-31 NOTE — Assessment & Plan Note (Signed)
Metatarsal pads added bilaterally

## 2012-01-31 NOTE — Assessment & Plan Note (Signed)
Much improved with use of compression and this is given her less of the peroneal nerve pain  She can use this as much as is comfortable but should not sleep in compression sleeve

## 2012-01-31 NOTE — Progress Notes (Signed)
Patient ID: RUTHELLEN TIPPY, female   DOB: 12/03/1945, 66 y.o.   MRN: [id number] Returns for custom orthotics. She is status post a knee replacement on the left. She has some peroneal nerve injury and this gives her an abnormal walking gait. She was getting burning into the forefoot on both sides worse on the left. We were able to fit her with some sports insoles with metatarsal and neuroma pads. She has less foot burning and less pain since then.  In addition her knee feels much better and is getting less swelling since she started using a compression sleeve. She also gets less of the neurogenic pain.  Her history of metatarsal pad started a long time ago before the knee problems. We had placed her in metatarsal pads in the past and she got good relief with those.  Examination Patient is in no acute distress  She has a long thin foot but with collapse of the transverse arch bilaterally She has bilateral Morton's calluses She has tenderness over the plantar surface over the metatarsal heads  Walking positioned is supinated However, on the left with the peroneal nerve issue she gets some dynamic valgus unless she has support in her shoes

## 2012-02-06 DIAGNOSIS — Z23 Encounter for immunization: Secondary | ICD-10-CM | POA: Diagnosis not present

## 2012-03-10 ENCOUNTER — Other Ambulatory Visit: Payer: Self-pay | Admitting: Family Medicine

## 2012-03-10 DIAGNOSIS — Z1231 Encounter for screening mammogram for malignant neoplasm of breast: Secondary | ICD-10-CM

## 2012-03-21 ENCOUNTER — Telehealth: Payer: Self-pay | Admitting: Family Medicine

## 2012-03-21 DIAGNOSIS — E785 Hyperlipidemia, unspecified: Secondary | ICD-10-CM

## 2012-03-21 DIAGNOSIS — M899 Disorder of bone, unspecified: Secondary | ICD-10-CM

## 2012-03-21 NOTE — Telephone Encounter (Signed)
Message copied by Excell Seltzer on Fri Mar 21, 2012  2:52 PM ------      Message from: Baldomero Lamy      Created: Mon Mar 17, 2012 10:08 AM      Regarding: Cpx labs  Thurs 03/27/12       Please order  future cpx labs for pt's upcomming lab appt.      Thanks      Rodney Booze

## 2012-03-27 ENCOUNTER — Other Ambulatory Visit (INDEPENDENT_AMBULATORY_CARE_PROVIDER_SITE_OTHER): Payer: Medicare Other

## 2012-03-27 DIAGNOSIS — E785 Hyperlipidemia, unspecified: Secondary | ICD-10-CM

## 2012-03-27 DIAGNOSIS — M899 Disorder of bone, unspecified: Secondary | ICD-10-CM

## 2012-03-27 LAB — COMPREHENSIVE METABOLIC PANEL
Albumin: 4.3 g/dL (ref 3.5–5.2)
Alkaline Phosphatase: 50 U/L (ref 39–117)
CO2: 23 mEq/L (ref 19–32)
Calcium: 10.1 mg/dL (ref 8.4–10.5)
Chloride: 104 mEq/L (ref 96–112)
Glucose, Bld: 109 mg/dL — ABNORMAL HIGH (ref 70–99)
Potassium: 4.1 mEq/L (ref 3.5–5.1)
Sodium: 138 mEq/L (ref 135–145)
Total Protein: 7.2 g/dL (ref 6.0–8.3)

## 2012-03-27 LAB — LIPID PANEL
Cholesterol: 242 mg/dL — ABNORMAL HIGH (ref 0–200)
Total CHOL/HDL Ratio: 3

## 2012-04-04 ENCOUNTER — Ambulatory Visit (INDEPENDENT_AMBULATORY_CARE_PROVIDER_SITE_OTHER): Payer: Medicare Other | Admitting: Family Medicine

## 2012-04-04 ENCOUNTER — Encounter: Payer: Self-pay | Admitting: Family Medicine

## 2012-04-04 VITALS — BP 110/78 | HR 91 | Temp 98.1°F | Ht 65.5 in | Wt 127.8 lb

## 2012-04-04 DIAGNOSIS — H9319 Tinnitus, unspecified ear: Secondary | ICD-10-CM | POA: Diagnosis not present

## 2012-04-04 DIAGNOSIS — E785 Hyperlipidemia, unspecified: Secondary | ICD-10-CM

## 2012-04-04 DIAGNOSIS — Z01419 Encounter for gynecological examination (general) (routine) without abnormal findings: Secondary | ICD-10-CM

## 2012-04-04 DIAGNOSIS — Z0000 Encounter for general adult medical examination without abnormal findings: Secondary | ICD-10-CM | POA: Diagnosis not present

## 2012-04-04 DIAGNOSIS — H9190 Unspecified hearing loss, unspecified ear: Secondary | ICD-10-CM

## 2012-04-04 NOTE — Assessment & Plan Note (Signed)
Inadequate control. Work on lifestyle change.

## 2012-04-04 NOTE — Progress Notes (Signed)
Subjective:    Patient ID: Yesenia Jones, female    DOB: 02-Jan-1946, 66 y.o.   MRN: [id number]  HPI I have personally reviewed the Medicare Annual Wellness questionnaire and have noted 1. The patient's medical and social history 2. Their use of alcohol, tobacco or illicit drugs 3. Their current medications and supplements 4. The patient's functional ability including ADL's, fall risks, home safety risks and hearing or visual             impairment. 5. Diet and physical activities 6. Evidence for depression or mood disorders The patients weight, height, BMI and visual acuity have been recorded in the chart I have made referrals, counseling and provided education to the patient based review of the above and I have provided the pt with a written personalized care plan for preventive services.  Elevated Cholesterol: Inadequate control on no medication. LDL goal <130 Lab Results  Component Value Date   CHOL 242* 03/27/2012   HDL 74.70 03/27/2012   LDLCALC 126* 07/16/2006   LDLDIRECT 144.9 03/27/2012   TRIG 76.0 03/27/2012   CHOLHDL 3 03/27/2012  Diet compliance: Poor Exercise: 5 days a week. Other complaints:  Wt Readings from Last 3 Encounters:  04/04/12 127 lb 12 oz (57.947 kg)  01/31/12 123 lb (55.792 kg)  01/07/12 123 lb (55.792 kg)    She has been under alot of stress with husband and fathers health issues.  Has had some decreased in hearing  And ringing in B ears, no heartbeat sound. This has going on for years, no change recently.   Review of Systems  Constitutional: Negative for fever, fatigue and unexpected weight change.  HENT: Negative for ear pain, congestion, sore throat, sneezing, trouble swallowing and sinus pressure.   Eyes: Negative for pain and itching.  Respiratory: Negative for cough, shortness of breath and wheezing.   Cardiovascular: Negative for chest pain, palpitations and leg swelling.  Gastrointestinal: Negative for nausea, abdominal pain, diarrhea,  constipation and blood in stool.  Genitourinary: Negative for dysuria, hematuria, vaginal discharge, difficulty urinating and menstrual problem.  Skin: Negative for rash.  Neurological: Negative for syncope, weakness, light-headedness, numbness and headaches.  Psychiatric/Behavioral: Negative for confusion and dysphoric mood. The patient is not nervous/anxious.        Objective:   Physical Exam  Constitutional: Vital signs are normal. She appears well-developed and well-nourished. She is cooperative.  Non-toxic appearance. She does not appear ill. No distress.  HENT:  Head: Normocephalic.  Right Ear: Hearing, tympanic membrane, external ear and ear canal normal.  Left Ear: Hearing, tympanic membrane, external ear and ear canal normal.  Nose: Nose normal.  Eyes: Conjunctivae normal, EOM and lids are normal. Pupils are equal, round, and reactive to light. No foreign bodies found.  Neck: Trachea normal and normal range of motion. Neck supple. Carotid bruit is not present. No mass and no thyromegaly present.  Cardiovascular: Normal rate, regular rhythm, S1 normal, S2 normal, normal heart sounds and intact distal pulses.  Exam reveals no gallop.   No murmur heard. Pulmonary/Chest: Effort normal and breath sounds normal. No respiratory distress. She has no wheezes. She has no rhonchi. She has no rales.  Abdominal: Soft. Normal appearance and bowel sounds are normal. She exhibits no distension, no fluid wave, no abdominal bruit and no mass. There is no hepatosplenomegaly. There is no tenderness. There is no rebound, no guarding and no CVA tenderness. No hernia.  Genitourinary: Vagina normal and uterus normal. No breast swelling, tenderness, discharge or  bleeding. Pelvic exam was performed with patient prone. There is no rash, tenderness or lesion on the right labia. There is no rash, tenderness or lesion on the left labia. Uterus is not enlarged and not tender. Right adnexum displays no mass, no  tenderness and no fullness. Left adnexum displays no mass, no tenderness and no fullness.  Lymphadenopathy:    She has no cervical adenopathy.    She has no axillary adenopathy.  Neurological: She is alert. She has normal strength. No cranial nerve deficit or sensory deficit.  Skin: Skin is warm, dry and intact. No rash noted.  Psychiatric: Her speech is normal and behavior is normal. Judgment normal. Her mood appears not anxious. Cognition and memory are normal. She does not exhibit a depressed mood.          Assessment & Plan:  Complete Physical Exam: The patient's preventative maintenance and recommended screening tests for an annual wellness exam were reviewed in full today.  Brought up to date unless services declined.  Counselled on the importance of diet, exercise, and its role in overall health and mortality.  The patient's FH and SH was reviewed, including their home life, tobacco status, and drug and alcohol status.   Vaccines: Up to date with TD, PNA and shingles vaccine. Given flu vaccine 01/2012.  Colon: 04/2010 no polyps.. Repeat in 10 years. Dr. Juanda Chance.  Mammo:Last 03/2010, nml.. Has scheduled for 04/18/2012 DXA: osteopenia 02/2009 on fosamax (on for 5-6 years, never diagnosed with osteoporosis)... Stopped in May 2013 when she ran out.[name] DXA  next year.  PAP/DVE: Previously done at [facility], but they retired. Last PAP done nml 2011, all previous were normal 3 in a row,has history of abnormal pap and crypothrapy 20 years ago,  No further paps needed, plan yearly DVE.

## 2012-04-04 NOTE — Patient Instructions (Addendum)
Work on low fat, low cholesterol diet, also eliminated sweet beverages. Avoid carbohydrate... White bread, potatos, pasta, rice. Continue exercise. Call if interested in referral to audiologist for hearing evaluation.

## 2012-04-18 ENCOUNTER — Ambulatory Visit
Admission: RE | Admit: 2012-04-18 | Discharge: 2012-04-18 | Disposition: A | Discharge status: Home / Self Care | Payer: Medicare Other | Source: Ambulatory Visit | Attending: Family Medicine | Admitting: Family Medicine

## 2012-04-18 DIAGNOSIS — Z1231 Encounter for screening mammogram for malignant neoplasm of breast: Secondary | ICD-10-CM

## 2012-04-22 ENCOUNTER — Ambulatory Visit (INDEPENDENT_AMBULATORY_CARE_PROVIDER_SITE_OTHER): Payer: Medicare Other | Admitting: Sports Medicine

## 2012-04-22 VITALS — BP 117/78 | Ht 65.0 in | Wt 125.0 lb

## 2012-04-22 DIAGNOSIS — G5730 Lesion of lateral popliteal nerve, unspecified lower limb: Secondary | ICD-10-CM

## 2012-04-22 DIAGNOSIS — G5760 Lesion of plantar nerve, unspecified lower limb: Secondary | ICD-10-CM | POA: Diagnosis not present

## 2012-04-22 MED ORDER — GABAPENTIN 300 MG PO CAPS: 300.0000 mg | ORAL_CAPSULE | Freq: Every day | ORAL | Status: DC

## 2012-04-22 NOTE — Progress Notes (Signed)
  Subjective:    Patient ID: Yesenia Jones, female    DOB: 05/12/1946, 66 y.o.   MRN: [id number]  HPI # Peroneal nerve irritation and injury following left knee replacement over a year ago The irritation laterally down left leg and over dorsum of foot is significantly improved when she wears left knee compression, and she has symptoms infrequently.  In spite of this she is usually aware of symptoms at least once daily She does not feel that the left leg is weak but does feel uncomfortable along the outside at times  # Right foot Morton's neuroma between 3rd and 4th metatarsals She reports significant improvement with custom orthotics and metatarsal pads.  She occasionally gets some pain between her third and fourth toe of the right foot at rest  Review of Systems  Allergies, medication, past medical history reviewed.      Objective:   Physical Exam GEN: NAD RIGHT KNEE: non-tender, wearing compression device  RIGHT FOOT:    Strength: 5/5 foot inversion and eversion   She is able to balance and hold her weight on lateral aspect of foot LEFT FOOT: moderate tenderness between 3rd and 4th metatarsal; negative squeeze test      Assessment & Plan:

## 2012-04-22 NOTE — Assessment & Plan Note (Addendum)
Pain is improved with orthotics and metatarsal pads. She was given toe spacers to use as needed between 3rd and 4th toes.   Continue orthotics and metatarsal pads when possible

## 2012-04-22 NOTE — Patient Instructions (Addendum)
Try Gabapentin 300 mg at nighttime for a month Keep a diary of how often it relieves her symptoms Watch out for mood irritability and strange dreams. It usually improves about a week to a few weeks.   Try the toe spacers between your 3rd and 4th toes on your right foot to help with your neuroma pain.   Follow-up in 1 month

## 2012-04-22 NOTE — Assessment & Plan Note (Signed)
Left knee compression sleeve seems to be helping symptoms, however, she continues to have occasional breakthrough symptoms. We will try gabapentin at bedtime to see if it can help alleviate this irritation. We discussed side effects and potential adverse effects. Follow-up in 1 month.

## 2012-04-30 NOTE — Assessment & Plan Note (Signed)
Non pulsatile, bilateral. Likely due to hearing loss.

## 2012-04-30 NOTE — Assessment & Plan Note (Signed)
Offered referral to audiologist. Pt will consider.

## 2012-05-19 ENCOUNTER — Other Ambulatory Visit: Payer: Self-pay | Admitting: *Deleted

## 2012-05-19 MED ORDER — GABAPENTIN 300 MG PO CAPS: 300.0000 mg | ORAL_CAPSULE | Freq: Every day | ORAL | Status: DC

## 2012-05-27 ENCOUNTER — Ambulatory Visit (INDEPENDENT_AMBULATORY_CARE_PROVIDER_SITE_OTHER): Payer: Medicare Other | Admitting: Sports Medicine

## 2012-05-27 VITALS — BP 108/64 | Ht 65.0 in | Wt 125.0 lb

## 2012-05-27 DIAGNOSIS — G5730 Lesion of lateral popliteal nerve, unspecified lower limb: Secondary | ICD-10-CM

## 2012-05-27 MED ORDER — GABAPENTIN 300 MG PO CAPS: 300.0000 mg | ORAL_CAPSULE | Freq: Three times a day (TID) | ORAL | Status: DC

## 2012-05-27 NOTE — Patient Instructions (Addendum)
Increase gabapentin to 300 mg   Week 1- increase to 1 tablet in the morning and 1 tablet at night Week 2 and 3- increase to 1 tablet in the morning, 1 tablet midday, and 1 tablet at night  Please follow up during week 4   Thank you for seeing [place] today!

## 2012-05-27 NOTE — Assessment & Plan Note (Signed)
We will gradually increase her dose of gabapentin and check after one month  I think she does have some persistent nerve irritation after her knee replacement  She has made some improvement but I think if we can find the right medication we can probably also help her numbness

## 2012-05-27 NOTE — Progress Notes (Signed)
  Subjective:    Patient ID: Yesenia Jones, female    DOB: 04/08/1946, 67 y.o.   MRN: [id number]  HPI  Pt presents to clinic for f/u of peroneal nerve irritation and injury following left knee replacement over a year ago and morton's neuroma on rt foot which is slightly improved. Using toe spacers between 3-4 toes on rt foot which is helpful, and wearing bodyhelix knee sleeve on rt which have been helpful. Started gabapentin 300 mg after last visit, has not noticed significant improved with this dosage of the medication. Numbness now is only on the lateral knee and not along the medial knee. Only on occasional days does it radiate further down the outside of the left leg    Review of Systems     Objective:   Physical Exam  NAD  Rt foot: Morton's callus on rt foot Dropped 3-4 MT heads TMT bossing on rt foot no scar   Lt leg: Mild numbness near peroneal head on lt No numbness on medial side of knee Good peroneal strength Plantar flexion strength good 130 degrees knee flexion Lacks 5 degrees full extension        Assessment & Plan:

## 2012-06-24 ENCOUNTER — Ambulatory Visit (INDEPENDENT_AMBULATORY_CARE_PROVIDER_SITE_OTHER): Payer: Medicare Other | Admitting: Sports Medicine

## 2012-06-24 VITALS — BP 120/80 | Ht 65.0 in | Wt 125.0 lb

## 2012-06-24 DIAGNOSIS — G5730 Lesion of lateral popliteal nerve, unspecified lower limb: Secondary | ICD-10-CM

## 2012-06-24 DIAGNOSIS — G5760 Lesion of plantar nerve, unspecified lower limb: Secondary | ICD-10-CM | POA: Diagnosis not present

## 2012-06-24 MED ORDER — GABAPENTIN 300 MG PO CAPS: 300.0000 mg | ORAL_CAPSULE | Freq: Three times a day (TID) | ORAL | Status: DC

## 2012-06-24 NOTE — Patient Instructions (Addendum)
Try adding padding to your right foot in between your 3rd and 4th toes.  Increase your bedtime dose of Neurontin to 600mg  for 2 weeks then increase to 900mg .  Keep your daytime doses at 300mg . Come back in 4 weeks for evaluation.  Good luck. You are doing great.

## 2012-06-24 NOTE — Assessment & Plan Note (Signed)
While she still has the sxs she is able to be a lot more active without worsening these That suggests to me she is getting some benefit from gabapentin  Also advised to limit s few of the leg lfgts as to not hurt the TKR  Keep up gabapentin another month  If not better add amitriptyline on RTC

## 2012-06-24 NOTE — Progress Notes (Signed)
Patient ID: SATONYA LUX, female   DOB: 26-Feb-1946, 67 y.o.   MRN: [id number]  Still no relief from the knee sxs Less pain than a sx of tightness, tingling, etc  Morton's neuroma also more painful  Up to gabapentin 300 tid for 2 weeks No real change on med yet No real side effects  Still going to the gym regularly   PE: NAD  MSK: L knee FROM. Non-TTP. Warm to palpation. Mild popping w/ internal and external rotation of the hip. No laxity w/ anterior/posterior drawer and valgus and varus stresses. Large scar from TKR.  No significant swellign. Numbness over the distribution of the peroneal nerve on left   R knee w/ FROM ann non-TTP.   Abnormal calluses w/ collapse in the MT heads 2-4.      A/P: \see prob list   Gabapentin 600mg  QHS for 2 wks then increase to 900mg  QHS. Continue w/ 300mg  in the morning and at lunch. Increase spacer between 3-4 toes w/ pads

## 2012-06-24 NOTE — Assessment & Plan Note (Signed)
Given additional spacers to try  This does do better with toe spacers  Keep up orthoitcs which really help

## 2012-06-30 DIAGNOSIS — H43399 Other vitreous opacities, unspecified eye: Secondary | ICD-10-CM | POA: Diagnosis not present

## 2012-06-30 DIAGNOSIS — H524 Presbyopia: Secondary | ICD-10-CM | POA: Diagnosis not present

## 2012-06-30 DIAGNOSIS — H2510 Age-related nuclear cataract, unspecified eye: Secondary | ICD-10-CM | POA: Diagnosis not present

## 2012-06-30 DIAGNOSIS — H04129 Dry eye syndrome of unspecified lacrimal gland: Secondary | ICD-10-CM | POA: Diagnosis not present

## 2012-07-23 ENCOUNTER — Encounter: Payer: Self-pay | Admitting: Sports Medicine

## 2012-07-23 ENCOUNTER — Ambulatory Visit (INDEPENDENT_AMBULATORY_CARE_PROVIDER_SITE_OTHER): Payer: Medicare Other | Admitting: Sports Medicine

## 2012-07-23 VITALS — BP 119/81 | HR 73 | Ht 65.0 in | Wt 125.0 lb

## 2012-07-23 DIAGNOSIS — G5732 Lesion of lateral popliteal nerve, left lower limb: Secondary | ICD-10-CM

## 2012-07-23 DIAGNOSIS — G5761 Lesion of plantar nerve, right lower limb: Secondary | ICD-10-CM

## 2012-07-23 DIAGNOSIS — G5730 Lesion of lateral popliteal nerve, unspecified lower limb: Secondary | ICD-10-CM | POA: Diagnosis not present

## 2012-07-23 DIAGNOSIS — G5760 Lesion of plantar nerve, unspecified lower limb: Secondary | ICD-10-CM

## 2012-07-23 MED ORDER — GABAPENTIN 300 MG PO CAPS: ORAL_CAPSULE | ORAL | Status: DC

## 2012-07-23 NOTE — Progress Notes (Signed)
Patient is here for followup of her left knee pain as well as bilateral foot pain. Patient in followup in one month ago was still having significant knee pain. Patient states that that has considerably improved. Patient states that the compression is doing very well and she is not having any of the discoloration of the skin in is able to do almost all activities that she would like. ROM is a bit tight this is improving but still not back to her baseline.   Patient is still having a lack of sensation over the lateral aspect of the knee and goes down her leg she is noticed as she's increased the amount of strength in her r. Patient continues to take the gabapentin taking 300 mg in the morning, 300 mg in the afternoon, and 300 milligrams at night. Patient is not having any side effects to this regimen. Patient is also wearing a spacer between the third and fourth toes on the right foot secondary to a Morton's neuroma. Patient states that this has significantly improved but sometimes the pad seems to move. Overall with all 3 of these problems she states she is 85-90% better.  Physical exam Blood pressure 119/81, pulse 73, height 5\' 5"  (1.651 m), weight 125 lb (56.7 kg). General: No apparent distress alert and oriented x3 mood and affect normal Left knee exam: She has full range of motion. She is nontender to palpation. She does not have any soreness to touch. All tendons appear to be intact. There is no laxity of the total knee replacement. The patient's scar is well healed with no signs of infection or erythema. There is no swelling of the knee. She is neurovascularly intact distally. Left ankle exam: Patient has full range of motion now and has good strength. She still has mild weakness with dorsiflexion. She is neurovascularly intact distally. Right foot exam still shows the abnormal callus with collapse of the transverse arch between the metatarsal heads 2 through 4.

## 2012-07-23 NOTE — Assessment & Plan Note (Addendum)
Encouraged patient to continue with the Neurontin, custom orthotics, and spacer. Patient was given more sapcer today in the hand of note she can order more online. In addition this patient was given some topical anti-inflammatory medication to attempt improvement as well. Patient is doing so well though we did no drastic changes. Patient will follow up on an as-needed basis. If this does seem to worsen in the future we will consider doing the ultrasound as well as the potential injection.

## 2012-07-23 NOTE — Assessment & Plan Note (Signed)
Patient's peroneal nerve lesion seems to be improving. I do think that this is more likely secondary to stretching and not a true tear because she is improving with her strength as well as some of her tactile sense. Encouraged patient to continue doing the exercises. Patient is not showing any signs of foot drop which is very optimistic. At this point we'll follow up on an as-needed basis. She'll continue to take the Neurontin with the dosing regimen as stated above. Patient was given a refill of this medication today. We'll see patient again on an as-needed basis.

## 2012-07-23 NOTE — Patient Instructions (Signed)
We are glad you are doing better.  Keep doing what you are doing.  The exercises and stretching is helping.  Continue to wear the orthotics. See if you can get one of those with numerous spacers. I think this will help the most.  Try the topical anti inflammatory medicine. Continue the gabapentin.  At this time come back and see [place] again as needed or in 3-4 months.

## 2012-08-04 ENCOUNTER — Other Ambulatory Visit: Payer: Self-pay | Admitting: *Deleted

## 2012-08-04 MED ORDER — DICLOFENAC SODIUM 1 % TD GEL: 2.0000 g | Freq: Two times a day (BID) | TRANSDERMAL | Status: DC

## 2012-10-31 ENCOUNTER — Telehealth: Payer: Self-pay | Admitting: *Deleted

## 2012-10-31 NOTE — Telephone Encounter (Signed)
Message copied by Mora Bellman on Fri Oct 31, 2012  9:45 AM ------      Message from: Lizbeth Bark      Created: Fri Oct 31, 2012  8:59 AM       Pt called stating she changed her meds to this schedule. Taking gabapentin 2 morning,1 late afternoon,2 at bedtime.   ------

## 2012-10-31 NOTE — Telephone Encounter (Signed)
Called pt- advised this is fine- as total dosage per day is the same.

## 2012-12-03 ENCOUNTER — Ambulatory Visit (INDEPENDENT_AMBULATORY_CARE_PROVIDER_SITE_OTHER): Payer: Medicare Other | Admitting: Sports Medicine

## 2012-12-03 VITALS — BP 100/70 | Ht 65.0 in | Wt 124.0 lb

## 2012-12-03 DIAGNOSIS — Q667 Congenital pes cavus, unspecified foot: Secondary | ICD-10-CM | POA: Diagnosis not present

## 2012-12-03 DIAGNOSIS — M1710 Unilateral primary osteoarthritis, unspecified knee: Secondary | ICD-10-CM | POA: Diagnosis not present

## 2012-12-03 DIAGNOSIS — G5730 Lesion of lateral popliteal nerve, unspecified lower limb: Secondary | ICD-10-CM | POA: Diagnosis not present

## 2012-12-03 DIAGNOSIS — IMO0002 Reserved for concepts with insufficient information to code with codable children: Secondary | ICD-10-CM

## 2012-12-03 DIAGNOSIS — G5732 Lesion of lateral popliteal nerve, left lower limb: Secondary | ICD-10-CM

## 2012-12-03 NOTE — Patient Instructions (Addendum)
Keep using orthotics  Try compression on RT knee  OK to use ibuprofen - 2 at a time for knee up to 3 and use 2 or 3 times daily until pain is less  Modify knee exercise to squeeze a ball on left  Add 2 hip exercises  Recheck in 3 mos

## 2012-12-03 NOTE — Assessment & Plan Note (Signed)
Since she is having some pain in the right knee although the right knee exam is unremarkable I would think we should try a compression sleeve over this knee as well since it does help the left significantly  Continue using compression over the left knee particularly after exercise  Do not and resistance to elliptical greater than what we are at now  Began hip abduction and rotation exercises and VMO exercises for the left leg  Recheck 3 months

## 2012-12-03 NOTE — Assessment & Plan Note (Signed)
Stable with the use of gabapentin  Continue with this medication but she can vary the total dose based on symptoms

## 2012-12-03 NOTE — Progress Notes (Signed)
Patient ID: Yesenia Jones, female   DOB: Dec 18, 1945, 67 y.o.   MRN: [id number]  Feet feel much with orthotics Less pain on left and right forefoot Cavus foot change is significant Orthotics provide good support  Left TKR Still gets warm Uses  Compression sleeve and that controls swelling  Going to gym Uses stair master with no pain Elliptical up to 16 incline and 4 resistance  Peroneal N - using now 600/300/600 of gabapentin This lessens most of the neuropathic pain symptoms down the outside of her left leg She still gets some numbness that sometimes runs from the outside of the lateral malleolus around to the top of the foot  Physical examination  No acute distress  Left knee shows a long midline scar from total knee replacement There is generalized warmth over the left knee joint compared to the right There is some VMO atrophy Hip abductors are weak   Comparison of muscles on the right side shows that they are of normal strength  Cavus feet noted in the get good correction with orthotics No tenderness on palpation of the feet today

## 2012-12-03 NOTE — Assessment & Plan Note (Signed)
Orthotics and she should continue these

## 2013-03-10 ENCOUNTER — Encounter: Payer: Self-pay | Admitting: Sports Medicine

## 2013-03-10 ENCOUNTER — Ambulatory Visit: Payer: Medicare Other | Admitting: Sports Medicine

## 2013-03-10 ENCOUNTER — Ambulatory Visit (INDEPENDENT_AMBULATORY_CARE_PROVIDER_SITE_OTHER): Payer: Medicare Other | Admitting: Sports Medicine

## 2013-03-10 VITALS — BP 130/83 | Ht 65.0 in | Wt 125.0 lb

## 2013-03-10 DIAGNOSIS — M25569 Pain in unspecified knee: Secondary | ICD-10-CM

## 2013-03-10 DIAGNOSIS — M1710 Unilateral primary osteoarthritis, unspecified knee: Secondary | ICD-10-CM

## 2013-03-10 DIAGNOSIS — G5732 Lesion of lateral popliteal nerve, left lower limb: Secondary | ICD-10-CM

## 2013-03-10 DIAGNOSIS — IMO0002 Reserved for concepts with insufficient information to code with codable children: Secondary | ICD-10-CM

## 2013-03-10 DIAGNOSIS — G5730 Lesion of lateral popliteal nerve, unspecified lower limb: Secondary | ICD-10-CM | POA: Diagnosis not present

## 2013-03-10 MED ORDER — GABAPENTIN 300 MG PO CAPS: 600.0000 mg | ORAL_CAPSULE | Freq: Three times a day (TID) | ORAL | Status: DC

## 2013-03-10 NOTE — Patient Instructions (Signed)
For the nerve pain, increase the gabapentin to 600mg  at noon.   For the right knee pain, take ibuprofen when the pain starts to hit.  Use the compression sleeve when you are more active Reduce the load on your knees with exercising on the leg press.   Follow up as needed.

## 2013-03-10 NOTE — Progress Notes (Signed)
Patient ID: Yesenia Jones    DOB: Aug 23, 1945, 67 y.o.   MRN: [id number] --- Subjective:  Yesenia Jones is a 67 y.o.female who presents for follow up on left peroneal nerve pain and right knee pain.  - peroneal nerve: injured during knee replacement 2 years ago. Is now on gabapentin 600am, 300 at noon and 600 qhs.  She is having more frequent episodes of discomfort in her foot compared to before. She has also been having an ache in the left great toe for about one month. She denies any trauma to the area. Discomfort is intermittent but worst at the end of the day. Gabapentin helps with the pain. Pain is located on the anterior portion of the midfoot and radiates to the medial aspect of the foot  - right knee pain: intermittent pain, located in medial aspetc of the knee, worst with walking for long periods of time.  For exercise, she uses the elliptical, the stair master. She also performs leg presses for strengthening. She has been doing her leg raises exercises for abduction strength as well. She denies any significant swelling. She has been wearing an old compression sleeve on the right knee intermittently.   ROS: see HPI Past Medical History: reviewed and updated medications and allergies. Social History: Tobacco: none  Objective: Filed Vitals:   03/10/13 1336  BP: 130/83    Physical Examination:   General appearance - alert, well appearing, and in no distress Left foot: cool and purple colored compared to right  normal range of motion of ankle, no tenderness to palpation, normal sensation of foot, Normal range of motion of great toe without pain.  Dorsalis pedis pulse weak but present bilaterally, normal posterior tibial pulse bilaterally Pes cavus  Right knee: no joint effusion or soft tissue swelling No tenderness to palpation along medial, lateral joint line or along patella.  50 to 150 degrees of knee range of motion.  Negative Mcmurrays', normal seated anterior drawer, normal vagus  and varus.   Strength testing:  5/5 abduction bilaterally  Assessment and Plan:  67 yo female with h/o left peroneal nerve injury who presents for follow up on peroneal nerve pain as well as right knee pain.   1. Peroneal nerve injury: evidence of reflex sympathetic dystrophy that could be explaining the change in color, coolness and pain in great toe in left foot. No evidence of blood circulation impairment. - increase gabapentin to 600mg  tid.  - continue wearing compression sleeve on left knee to keep peroneal nerve covered and warm Reck this in 3 mos  2. Right knee pain: suspect from mild osteoarthritis - decrease load and flexion with knee press - avoid stair master - start ibuprofen when pain starts - body helix compression sleeve on right knee - follow up as needed  Yesenia Jones, PGY-3 Family Medicine Resident

## 2013-03-10 NOTE — Assessment & Plan Note (Signed)
Thre RT knee is mild and left has been replaced  See plan  Compression seems to helps sxs

## 2013-03-10 NOTE — Assessment & Plan Note (Signed)
We will increase gabapentin as this seems to be wearing off before the next dose  More RSD changes on dorsum of left foot

## 2013-03-11 DIAGNOSIS — Z23 Encounter for immunization: Secondary | ICD-10-CM | POA: Diagnosis not present

## 2013-03-16 ENCOUNTER — Telehealth: Payer: Self-pay | Admitting: Family Medicine

## 2013-03-16 NOTE — Telephone Encounter (Signed)
Pt left vm asking if she needs to have bone density test this year.  Cb [id number].

## 2013-03-17 NOTE — Telephone Encounter (Signed)
Yes she is due for DEXA this year and is due for CPX in laste November

## 2013-03-17 NOTE — Telephone Encounter (Signed)
Yesenia Jones notified she is due for Dexa Scan this year.  Already has physical scheduled for 04/16/2013 with Dr. Ermalene Searing.

## 2013-03-18 ENCOUNTER — Telehealth: Payer: Self-pay | Admitting: Family Medicine

## 2013-03-18 DIAGNOSIS — M899 Disorder of bone, unspecified: Secondary | ICD-10-CM

## 2013-03-18 DIAGNOSIS — Z1231 Encounter for screening mammogram for malignant neoplasm of breast: Secondary | ICD-10-CM

## 2013-03-18 NOTE — Telephone Encounter (Signed)
Patient received call she needs a dexa scan.  I don't see an order for the dexa scan in the computer.  Patient tried to make her own appointment at the [facility] and they said she needs an order.  Patient,also,needs a screening mammogram.  Patient wants to do both appointments at the same time.  She'd like an appointment scheduled early morning and before her physical on December 4.

## 2013-03-19 NOTE — Addendum Note (Signed)
Addended byKerby Nora E on: 03/19/2013 12:06 AM   Modules accepted: Orders

## 2013-03-19 NOTE — Telephone Encounter (Signed)
Referrals sent

## 2013-04-06 ENCOUNTER — Telehealth: Payer: Self-pay | Admitting: Family Medicine

## 2013-04-06 ENCOUNTER — Other Ambulatory Visit (INDEPENDENT_AMBULATORY_CARE_PROVIDER_SITE_OTHER): Payer: Medicare Other

## 2013-04-06 DIAGNOSIS — Z0000 Encounter for general adult medical examination without abnormal findings: Secondary | ICD-10-CM

## 2013-04-06 DIAGNOSIS — E559 Vitamin D deficiency, unspecified: Secondary | ICD-10-CM | POA: Diagnosis not present

## 2013-04-06 DIAGNOSIS — E785 Hyperlipidemia, unspecified: Secondary | ICD-10-CM | POA: Diagnosis not present

## 2013-04-06 DIAGNOSIS — M899 Disorder of bone, unspecified: Secondary | ICD-10-CM

## 2013-04-06 LAB — LIPID PANEL
Cholesterol: 219 mg/dL — ABNORMAL HIGH (ref 0–200)
HDL: 59 mg/dL (ref >39.00)
Total CHOL/HDL Ratio: 4
Triglycerides: 67 mg/dL (ref 0.0–149.0)
VLDL: 13.4 mg/dL (ref 0.0–40.0)

## 2013-04-06 LAB — CBC WITH DIFFERENTIAL/PLATELET
Basophils Relative: 1.2 % (ref 0.0–3.0)
Eosinophils Absolute: 0.1 10*3/uL (ref 0.0–0.7)
Eosinophils Relative: 3.5 % (ref 0.0–5.0)
Hemoglobin: 14 g/dL (ref 12.0–15.0)
Lymphocytes Relative: 33 % (ref 12.0–46.0)
MCHC: 33.7 g/dL (ref 30.0–36.0)
MCV: 91 fl (ref 78.0–100.0)
Monocytes Absolute: 0.5 10*3/uL (ref 0.1–1.0)
Neutro Abs: 1.7 10*3/uL (ref 1.4–7.7)
Neutrophils Relative %: 47.6 % (ref 43.0–77.0)
RBC: 4.57 Mil/uL (ref 3.87–5.11)
WBC: 3.5 10*3/uL — ABNORMAL LOW (ref 4.5–10.5)

## 2013-04-06 LAB — COMPREHENSIVE METABOLIC PANEL
ALT: 17 U/L (ref 0–35)
AST: 23 U/L (ref 0–37)
BUN: 14 mg/dL (ref 6–23)
Creatinine, Ser: 0.8 mg/dL (ref 0.4–1.2)
Total Bilirubin: 0.7 mg/dL (ref 0.3–1.2)

## 2013-04-06 NOTE — Telephone Encounter (Signed)
Message copied by Excell Seltzer on Mon Apr 06, 2013  8:20 AM ------      Message from: [name]      Created: Tue Mar 31, 2013 12:12 PM      Regarding: Lab orders for Monday, 11.24.14       Patient is scheduled for CPX labs, please order future labs, Thanks , Yesenia Jones       ------

## 2013-04-16 ENCOUNTER — Ambulatory Visit (INDEPENDENT_AMBULATORY_CARE_PROVIDER_SITE_OTHER): Payer: Medicare Other | Admitting: Family Medicine

## 2013-04-16 ENCOUNTER — Encounter: Payer: Self-pay | Admitting: Family Medicine

## 2013-04-16 VITALS — BP 98/60 | HR 74 | Temp 98.3°F | Ht 65.75 in | Wt 122.5 lb

## 2013-04-16 DIAGNOSIS — E785 Hyperlipidemia, unspecified: Secondary | ICD-10-CM | POA: Diagnosis not present

## 2013-04-16 DIAGNOSIS — Z0000 Encounter for general adult medical examination without abnormal findings: Secondary | ICD-10-CM

## 2013-04-16 NOTE — Progress Notes (Signed)
Pre-visit discussion using our clinic review tool. No additional management support is needed unless otherwise documented below in the visit note.  

## 2013-04-16 NOTE — Assessment & Plan Note (Signed)
Start red yeast rice, recheck in 3 months. 

## 2013-04-16 NOTE — Patient Instructions (Addendum)
Let me know if viral infection symptoms are not getting better. Start red yeast rice 600 mg two cap twice daily... If any side effects.. You can try coQ10 to help with muscle ache.  Return for lab only fasting... In 3 months. Look into setting up living will.  Hypercholesterolemia High Blood Cholesterol Cholesterol is a white, waxy, fat-like protein needed by your body in small amounts. The liver makes all the cholesterol you need. It is carried from the liver by the blood through the blood vessels. Deposits (plaque) may build up on blood vessel walls. This makes the arteries narrower and stiffer. Plaque increases the risk for heart attack and stroke. You cannot feel your cholesterol level even if it is very high. The only way to know is by a blood test to check your lipid (fats) levels. Once you know your cholesterol levels, you should keep a record of the test results. Work with your caregiver to to keep your levels in the desired range. WHAT THE RESULTS MEAN:  Total cholesterol is a rough measure of all the cholesterol in your blood.  LDL is the so-called bad cholesterol. This is the type that deposits cholesterol in the walls of the arteries. You want this level to be low.  HDL is the good cholesterol because it cleans the arteries and carries the LDL away. You want this level to be high.  Triglycerides are fat that the body can either burn for energy or store. High levels are closely linked to heart disease. DESIRED LEVELS:  Total cholesterol below 200.  LDL below 100 for people at risk, below 70 for very high risk. YOUR GOAL IS <130 given you risk factor profile.  HDL above 50 is good, above 60 is best.  Triglycerides below 150. HOW TO LOWER YOUR CHOLESTEROL:  Diet.  Choose fish or white meat chicken and [place], roasted or baked. Limit fatty cuts of red meat, fried foods, and processed meats, such as sausage and lunch meat.  Eat lots of fresh fruits and vegetables. Choose whole  grains, beans, pasta, potatoes and cereals.  Use only small amounts of olive, corn or canola oils. Avoid butter, mayonnaise, shortening or palm kernel oils. Avoid foods with trans-fats.  Use skim/nonfat milk and low-fat/nonfat yogurt and cheeses. Avoid whole milk, cream, ice cream, egg yolks and cheeses. Healthy desserts include angel food cake, gingersnaps, animal crackers, hard candy, popsicles, and low-fat/nonfat frozen yogurt. Avoid pastries, cakes, pies and cookies.  Exercise.  A regular program helps decrease LDL and raises HDL.  Helps with weight control.  Do things that increase your activity level like gardening, walking, or taking the stairs.  Medication.  May be prescribed by your caregiver to help lowering cholesterol and the risk for heart disease.  You may need medicine even if your levels are normal if you have several risk factors. HOME CARE INSTRUCTIONS   Follow your diet and exercise programs as suggested by your caregiver.  Take medications as directed.  Have blood work done when your caregiver feels it is necessary. MAKE SURE YOU:   Understand these instructions.  Will watch your condition.  Will get help right away if you are not doing well or get worse. Document Released: 04/30/2005 Document Revised: 07/23/2011 Document Reviewed: 10/16/2006 [facility] Patient Information 2014 [place], [place].

## 2013-04-16 NOTE — Progress Notes (Signed)
HPI  I have personally reviewed the Medicare Annual Wellness questionnaire and have noted  1. The patient's medical and social history  2. Their use of alcohol, tobacco or illicit drugs  3. Their current medications and supplements  4. The patient's functional ability including ADL's, fall risks, home safety risks and hearing or visual  impairment.  5. Diet and physical activities  6. Evidence for depression or mood disorders  The patients weight, height, BMI and visual acuity have been recorded in the chart  I have made referrals, counseling and provided education to the patient based review of the above and I have provided the pt with a written personalized care plan for preventive services.   She is under stress given husband's  Bladder cancer.  Denies depression/anxiety.   Gradually getting over a URI.  Elevated Cholesterol: Inadequate control on no medication. LDL goal <130  Lab Results  Component Value Date   CHOL 219* 04/06/2013   HDL 59.00 04/06/2013   LDLCALC 126* 07/16/2006   LDLDIRECT 149.3 04/06/2013   TRIG 67.0 04/06/2013   CHOLHDL 4 04/06/2013    Diet compliance: Poor  Exercise: 5 days a week.  Other complaints:  Wt Readings from Last 3 Encounters:  04/16/13 122 lb 8 oz (55.566 kg)  03/10/13 125 lb (56.7 kg)  12/03/12 124 lb (56.246 kg)    Followed by Dr. Darrick Penna for knee pain as well as pernoneal injury.  She is using gabapentin has helped with nerve symptoms.  Review of Systems  Constitutional: Negative for fever, fatigue and unexpected weight change.  HENT: Negative for ear pain, congestion, sore throat, sneezing, trouble swallowing and sinus pressure.  Eyes: Negative for pain and itching.  Respiratory: Negative for cough, shortness of breath and wheezing.  Cardiovascular: Negative for chest pain, palpitations and leg swelling.  Gastrointestinal: Negative for nausea, abdominal pain, diarrhea, constipation and blood in stool.  Genitourinary: Negative for  dysuria, hematuria, vaginal discharge, difficulty urinating and menstrual problem.  Skin: Negative for rash.  Neurological: Negative for syncope, weakness, light-headedness, numbness and headaches.  Psychiatric/Behavioral: Negative for confusion and dysphoric mood. The patient is not nervous/anxious.  Objective:   Physical Exam  Constitutional: Vital signs are normal. She appears well-developed and well-nourished. She is cooperative. Non-toxic appearance. She does not appear ill. No distress.  HENT:  Head: Normocephalic.  Right Ear: Hearing, tympanic membrane, external ear and ear canal normal.  Left Ear: Hearing, tympanic membrane, external ear and ear canal normal.  Nose: Nose normal.  Eyes: Conjunctivae normal, EOM and lids are normal. Pupils are equal, round, and reactive to light. No foreign bodies found.  Neck: Trachea normal and normal range of motion. Neck supple. Carotid bruit is not present. No mass and no thyromegaly present.  Cardiovascular: Normal rate, regular rhythm, S1 normal, S2 normal, normal heart sounds and intact distal pulses. Exam reveals no gallop.  No murmur heard.  Pulmonary/Chest: Effort normal and breath sounds normal. No respiratory distress. She has no wheezes. She has no rhonchi. She has no rales.  Abdominal: Soft. Normal appearance and bowel sounds are normal. She exhibits no distension, no fluid wave, no abdominal bruit and no mass. There is no hepatosplenomegaly. There is no tenderness. There is no rebound, no guarding and no CVA tenderness. No hernia.  Genitourinary: Vagina normal and uterus normal. No breast swelling, tenderness, discharge or bleeding. Pelvic exam was performed with patient prone. There is no rash, tenderness or lesion on the right labia. There is no rash, tenderness or  lesion on the left labia. Uterus is not enlarged and not tender. Right adnexum displays no mass, no tenderness and no fullness. Left adnexum displays no mass, no tenderness and no  fullness.  Lymphadenopathy:  She has no cervical adenopathy.  She has no axillary adenopathy.  Neurological: She is alert. She has normal strength. No cranial nerve deficit or sensory deficit.  Skin: Skin is warm, dry and intact. No rash noted.  Psychiatric: Her speech is normal and behavior is normal. Judgment normal. Her mood appears not anxious. Cognition and memory are normal. She does not exhibit a depressed mood.  Assessment & Plan:   Complete Physical Exam: The patient's preventative maintenance and recommended screening tests for an annual wellness exam were reviewed in full today.  Brought up to date unless services declined.  Counselled on the importance of diet, exercise, and its role in overall health and mortality.  The patient's FH and SH was reviewed, including their home life, tobacco status, and drug and alcohol status.   Vaccines: Up to date with TD, PNA and shingles vaccine. Given flu vaccine 02/2013.  Colon: 04/2010 no polyps.. Repeat in 10 years. Dr. Juanda Chance.  Mammo:Last 04/2012, nml.. Has scheduled for next week. DXA: osteopenia 02/2009 on fosamax (on for 5-6 years, never diagnosed with osteoporosis)... Stopped in May 2013 when she ran out.[name] DXA THIS year.  PAP/DVE: [facility] done at [facility], but they retired. Last PAP done nml 2011, all previous were normal 3 in a row,has history of abnormal pap and crypothrapy 20 years ago, No further paps needed, plan yearly DVE.

## 2013-04-22 ENCOUNTER — Ambulatory Visit
Admission: RE | Admit: 2013-04-22 | Discharge: 2013-04-22 | Disposition: A | Discharge status: Home / Self Care | Payer: Medicare Other | Source: Ambulatory Visit | Attending: Family Medicine | Admitting: Family Medicine

## 2013-04-22 DIAGNOSIS — M899 Disorder of bone, unspecified: Secondary | ICD-10-CM

## 2013-04-22 DIAGNOSIS — Z1231 Encounter for screening mammogram for malignant neoplasm of breast: Secondary | ICD-10-CM

## 2013-04-28 ENCOUNTER — Telehealth: Payer: Self-pay | Admitting: Family Medicine

## 2013-04-28 MED ORDER — ALENDRONATE SODIUM 70 MG PO TABS: 70.0000 mg | ORAL_TABLET | ORAL | Status: DC

## 2013-04-28 NOTE — Telephone Encounter (Signed)
Message copied by Kerby Nora E on Tue Apr 28, 2013  1:21 PM ------      Message from: Damita Lack      Created: Tue Apr 28, 2013 12:12 PM       Patient notified as instructed by telephone.  She would like to go ahead and restart fosamax.  Advised prescription would be sent to her pharmacy today. ------

## 2013-04-30 ENCOUNTER — Other Ambulatory Visit: Payer: Self-pay | Admitting: *Deleted

## 2013-04-30 MED ORDER — GABAPENTIN 300 MG PO CAPS: 600.0000 mg | ORAL_CAPSULE | Freq: Three times a day (TID) | ORAL | Status: DC

## 2013-06-09 ENCOUNTER — Ambulatory Visit: Payer: Medicare Other | Admitting: Sports Medicine

## 2013-07-15 ENCOUNTER — Other Ambulatory Visit (INDEPENDENT_AMBULATORY_CARE_PROVIDER_SITE_OTHER): Payer: Medicare Other

## 2013-07-15 DIAGNOSIS — E785 Hyperlipidemia, unspecified: Secondary | ICD-10-CM

## 2013-07-15 LAB — COMPREHENSIVE METABOLIC PANEL
ALBUMIN: 3.9 g/dL (ref 3.5–5.2)
ALK PHOS: 42 U/L (ref 39–117)
ALT: 17 U/L (ref 0–35)
AST: 20 U/L (ref 0–37)
BILIRUBIN TOTAL: 0.7 mg/dL (ref 0.3–1.2)
BUN: 15 mg/dL (ref 6–23)
CALCIUM: 10 mg/dL (ref 8.4–10.5)
CO2: 25 mEq/L (ref 19–32)
Chloride: 106 mEq/L (ref 96–112)
Creatinine, Ser: 0.8 mg/dL (ref 0.4–1.2)
GFR: 72.01 mL/min (ref >60.00)
GLUCOSE: 92 mg/dL (ref 70–99)
Potassium: 4.2 mEq/L (ref 3.5–5.1)
Sodium: 139 mEq/L (ref 135–145)
Total Protein: 6.8 g/dL (ref 6.0–8.3)

## 2013-07-15 LAB — LIPID PANEL
CHOLESTEROL: 205 mg/dL — AB (ref 0–200)
HDL: 60 mg/dL (ref >39.00)
LDL Cholesterol: 131 mg/dL — ABNORMAL HIGH (ref 0–99)
Total CHOL/HDL Ratio: 3
Triglycerides: 71 mg/dL (ref 0.0–149.0)
VLDL: 14.2 mg/dL (ref 0.0–40.0)

## 2013-11-30 ENCOUNTER — Other Ambulatory Visit: Payer: Self-pay | Admitting: *Deleted

## 2013-11-30 MED ORDER — DICLOFENAC SODIUM 1 % TD GEL: 2.0000 g | Freq: Two times a day (BID) | TRANSDERMAL | Status: DC

## 2014-02-18 ENCOUNTER — Encounter: Payer: Self-pay | Admitting: Internal Medicine

## 2014-03-03 DIAGNOSIS — Z23 Encounter for immunization: Secondary | ICD-10-CM | POA: Diagnosis not present

## 2014-03-08 ENCOUNTER — Other Ambulatory Visit: Payer: Self-pay | Admitting: Family Medicine

## 2014-03-29 ENCOUNTER — Other Ambulatory Visit: Payer: Self-pay

## 2014-03-29 DIAGNOSIS — Z1231 Encounter for screening mammogram for malignant neoplasm of breast: Secondary | ICD-10-CM

## 2014-04-05 ENCOUNTER — Other Ambulatory Visit: Payer: Self-pay | Admitting: *Deleted

## 2014-04-05 MED ORDER — GABAPENTIN 300 MG PO CAPS: 600.0000 mg | ORAL_CAPSULE | Freq: Three times a day (TID) | ORAL | Status: DC

## 2014-04-05 NOTE — Telephone Encounter (Signed)
Last office visit 04/16/2013.  Last refilled 04/30/2013 for #360 with 2 refills.  CPE schedule 05/28/2014.  Ok to refill?

## 2014-04-26 ENCOUNTER — Ambulatory Visit
Admission: RE | Admit: 2014-04-26 | Discharge: 2014-04-26 | Disposition: A | Discharge status: Home / Self Care | Payer: Medicare Other | Source: Ambulatory Visit

## 2014-04-26 DIAGNOSIS — Z1231 Encounter for screening mammogram for malignant neoplasm of breast: Secondary | ICD-10-CM | POA: Diagnosis not present

## 2014-05-19 ENCOUNTER — Telehealth: Payer: Self-pay | Admitting: Family Medicine

## 2014-05-19 DIAGNOSIS — M8580 Other specified disorders of bone density and structure, unspecified site: Secondary | ICD-10-CM

## 2014-05-19 DIAGNOSIS — E785 Hyperlipidemia, unspecified: Secondary | ICD-10-CM

## 2014-05-19 NOTE — Telephone Encounter (Signed)
-----   Message from [name] sent at 05/11/2014  2:30 PM EST ----- Regarding: Lab orders for Friday,1.8.16 Patient is scheduled for CPX labs, please order future labs, Thanks , Camelia Eng

## 2014-05-21 ENCOUNTER — Other Ambulatory Visit (INDEPENDENT_AMBULATORY_CARE_PROVIDER_SITE_OTHER): Payer: Medicare Other

## 2014-05-21 DIAGNOSIS — E785 Hyperlipidemia, unspecified: Secondary | ICD-10-CM | POA: Diagnosis not present

## 2014-05-21 DIAGNOSIS — M8580 Other specified disorders of bone density and structure, unspecified site: Secondary | ICD-10-CM | POA: Diagnosis not present

## 2014-05-21 LAB — COMPREHENSIVE METABOLIC PANEL
ALK PHOS: 41 U/L (ref 39–117)
ALT: 17 U/L (ref 0–35)
AST: 16 U/L (ref 0–37)
Albumin: 3.8 g/dL (ref 3.5–5.2)
BUN: 14 mg/dL (ref 6–23)
CHLORIDE: 107 meq/L (ref 96–112)
CO2: 27 mEq/L (ref 19–32)
CREATININE: 0.7 mg/dL (ref 0.4–1.2)
Calcium: 9.8 mg/dL (ref 8.4–10.5)
GFR: 84.45 mL/min (ref >60.00)
Glucose, Bld: 89 mg/dL (ref 70–99)
Potassium: 4.2 mEq/L (ref 3.5–5.1)
Sodium: 140 mEq/L (ref 135–145)
TOTAL PROTEIN: 6.9 g/dL (ref 6.0–8.3)
Total Bilirubin: 0.5 mg/dL (ref 0.2–1.2)

## 2014-05-21 LAB — LIPID PANEL
Cholesterol: 204 mg/dL — ABNORMAL HIGH (ref 0–200)
HDL: 49.4 mg/dL (ref >39.00)
LDL Cholesterol: 137 mg/dL — ABNORMAL HIGH (ref 0–99)
NONHDL: 154.6
Total CHOL/HDL Ratio: 4
Triglycerides: 90 mg/dL (ref 0.0–149.0)
VLDL: 18 mg/dL (ref 0.0–40.0)

## 2014-05-24 LAB — VITAMIN D 25 HYDROXY (VIT D DEFICIENCY, FRACTURES): VITD: 47.38 ng/mL (ref 30.00–100.00)

## 2014-05-28 ENCOUNTER — Ambulatory Visit (INDEPENDENT_AMBULATORY_CARE_PROVIDER_SITE_OTHER): Payer: Medicare Other | Admitting: Family Medicine

## 2014-05-28 ENCOUNTER — Encounter: Payer: Self-pay | Admitting: Family Medicine

## 2014-05-28 VITALS — BP 120/78 | HR 63 | Temp 97.9°F | Ht 65.75 in | Wt 125.8 lb

## 2014-05-28 DIAGNOSIS — Z7189 Other specified counseling: Secondary | ICD-10-CM

## 2014-05-28 DIAGNOSIS — Z0000 Encounter for general adult medical examination without abnormal findings: Secondary | ICD-10-CM | POA: Diagnosis not present

## 2014-05-28 DIAGNOSIS — Z23 Encounter for immunization: Secondary | ICD-10-CM

## 2014-05-28 DIAGNOSIS — H9193 Unspecified hearing loss, bilateral: Secondary | ICD-10-CM | POA: Diagnosis not present

## 2014-05-28 DIAGNOSIS — E785 Hyperlipidemia, unspecified: Secondary | ICD-10-CM

## 2014-05-28 DIAGNOSIS — M810 Age-related osteoporosis without current pathological fracture: Secondary | ICD-10-CM

## 2014-05-28 NOTE — Patient Instructions (Addendum)
Work on low cholesterol diet, keep up the exercise.  Continue red yeast rice, can add back the cpo q 10. Return for lab only visit to check cholesterol in 6 months.. When you set up mammogram also repeat bone density.  Try 2 sprays per nostril daily longterm of nasocort x 2 week, call if not improving.

## 2014-05-28 NOTE — Progress Notes (Signed)
Pre visit review using our clinic review tool, if applicable. No additional management support is needed unless otherwise documented below in the visit note. 

## 2014-05-28 NOTE — Assessment & Plan Note (Signed)
May partially be due to eustacian tube dysfunction given popping and ear fullness. Treat with nasal steroid daily, if not better consider course of prednisone.

## 2014-05-28 NOTE — Progress Notes (Signed)
HPI I have personally reviewed the Medicare Annual Wellness questionnaire and have noted 1. The patient's medical and social history 2. Their use of alcohol, tobacco or illicit drugs 3. Their current medications and supplements 4. The patient's functional ability including ADL's, fall risks, home safety risks and hearing or visual             impairment. 5. Diet and physical activities 6. Evidence for depression or mood disorders The patients weight, height, BMI and visual acuity have been recorded in the chart I have made referrals, counseling and provided education to the patient based review of the above and I have provided the pt with a written personalized care plan for preventive services.   She has noted ringing in left ear. Recent cold had made hearing worse. Decreased hearing in last several years.  No vertigo.  Father and mother with hearing loss.   Husband with bladder cancer but doing better. Less stress this year. Mood better overall.  Hyperlipidemia:  Almost at goal LDL < 130  On omega threes and red yeast rice 2 tab twice daily ( no SE) Lab Results  Component Value Date   CHOL 204* 05/21/2014   HDL 49.40 05/21/2014   LDLCALC 137* 05/21/2014   LDLDIRECT 149.3 04/06/2013   TRIG 90.0 05/21/2014   CHOLHDL 4 05/21/2014  Diet compliance: moderate Exercise: elliptical, weights, bike Wt Readings from Last 3 Encounters:  05/28/14 125 lb 12 oz (57.04 kg)  04/16/13 122 lb 8 oz (55.566 kg)  03/10/13 125 lb (56.7 kg)    Followed by Dr. Darrick Penna for knee pain as well as peroneal injury. She is using gabapentin has helped with nerve symptoms.  Review of Systems  Constitutional: Negative for fever, fatigue and unexpected weight change.  HENT: Negative for ear pain, congestion, sore throat, sneezing, trouble swallowing and sinus pressure.  Eyes: Negative for pain and itching.  Respiratory: Negative for cough, shortness of breath and wheezing.  Cardiovascular: Negative  for chest pain, palpitations and leg swelling.  Gastrointestinal: Negative for nausea, abdominal pain, diarrhea, constipation and blood in stool.  Genitourinary: Negative for dysuria, hematuria, vaginal discharge, difficulty urinating and menstrual problem.  Skin: Negative for rash.  Neurological: Negative for syncope, weakness, light-headedness, numbness and headaches.  Psychiatric/Behavioral: Negative for confusion and dysphoric mood. The patient is not nervous/anxious.  Objective:   Physical Exam  Constitutional: Vital signs are normal. She appears well-developed and well-nourished. She is cooperative. Non-toxic appearance. She does not appear ill. No distress.  HENT:  Head: Normocephalic.  Right Ear: Hearing, tympanic membrane, external ear and ear canal normal.  Left Ear: Hearing, tympanic membrane, external ear and ear canal normal.  Nose: Nose normal.  small amount of clear fluid behind bilateral TMS Eyes: Conjunctivae normal, EOM and lids are normal. Pupils are equal, round, and reactive to light. No foreign bodies found.  Neck: Trachea normal and normal range of motion. Neck supple. Carotid bruit is not present. No mass and no thyromegaly present.  Cardiovascular: Normal rate, regular rhythm, S1 normal, S2 normal, normal heart sounds and intact distal pulses. Exam reveals no gallop.  No murmur heard.  Pulmonary/Chest: Effort normal and breath sounds normal. No respiratory distress. She has no wheezes. She has no rhonchi. She has no rales.  Abdominal: Soft. Normal appearance and bowel sounds are normal. She exhibits no distension, no fluid wave, no abdominal bruit and no mass. There is no hepatosplenomegaly. There is no tenderness. There is no rebound, no guarding and no CVA  tenderness. No hernia.  Genitourinary: no breast mass, no nipple changes Lymphadenopathy:  She has no cervical adenopathy.  She has no axillary adenopathy.  Neurological: She is alert. She has  normal strength. No cranial nerve deficit or sensory deficit.  Skin: Skin is warm, dry and intact. No rash noted.  Psychiatric: Her speech is normal and behavior is normal. Judgment normal. Her mood appears not anxious. Cognition and memory are normal. She does not exhibit a depressed mood.  Assessment & Plan:   Complete Physical Exam: The patient's preventative maintenance and recommended screening tests for an annual wellness exam were reviewed in full today.  Brought up to date unless services declined.  Counselled on the importance of diet, exercise, and its role in overall health and mortality.  The patient's FH and SH was reviewed, including their home life, tobacco status, and drug and alcohol status.   Vaccines: Up to date with TD, PNA and shingles vaccine. Given flu vaccine 10/15, given prevnar today..  Colon: 04/2010 no polyps.. Repeat in 10 years. Dr. Juanda Chance.  Mammo:Last 04/2014 nml..  DXA: osteopenia 02/2009 on fosamax (on for 5-6 years, never diagnosed with osteoporosis)... Stopped in May 2013 when she ran out.Marland Kitchen  DEXA 04/22/2013: osteoporosis, started back on fosamax. Plan recheck in 04/2015 PAP/DVE: Previously done at [facility], but they retired. Last PAP done nml 2011, all previous were normal 3 in a row,has history of abnormal pap and crypothrapy 20 years ago, No further paps needed, plan every other year DVE. Asymptomatic.

## 2014-05-28 NOTE — Assessment & Plan Note (Signed)
Work on low cholesterol diet, keep up the exercise.  Continue red yeast rice, can add back the cpo q 10. Return for lab only visit to check cholesterol in 6 months..  If not LDL < 130 will consider low dose liptior.

## 2014-05-28 NOTE — Addendum Note (Signed)
Addended by: Damita Lack on: 05/28/2014 12:47 PM   Modules accepted: Orders

## 2014-06-01 ENCOUNTER — Other Ambulatory Visit: Payer: Self-pay | Admitting: Family Medicine

## 2014-07-20 DIAGNOSIS — H53002 Unspecified amblyopia, left eye: Secondary | ICD-10-CM | POA: Diagnosis not present

## 2014-09-22 ENCOUNTER — Ambulatory Visit (INDEPENDENT_AMBULATORY_CARE_PROVIDER_SITE_OTHER): Payer: Medicare Other | Admitting: Sports Medicine

## 2014-09-22 ENCOUNTER — Encounter: Payer: Self-pay | Admitting: Sports Medicine

## 2014-09-22 VITALS — BP 125/65 | HR 67 | Ht 65.0 in | Wt 124.0 lb

## 2014-09-22 DIAGNOSIS — M1710 Unilateral primary osteoarthritis, unspecified knee: Secondary | ICD-10-CM

## 2014-09-22 DIAGNOSIS — M25562 Pain in left knee: Secondary | ICD-10-CM | POA: Diagnosis not present

## 2014-09-22 DIAGNOSIS — Q667 Congenital pes cavus, unspecified foot: Secondary | ICD-10-CM

## 2014-09-22 DIAGNOSIS — M25561 Pain in right knee: Secondary | ICD-10-CM

## 2014-09-22 DIAGNOSIS — IMO0002 Reserved for concepts with insufficient information to code with codable children: Secondary | ICD-10-CM

## 2014-09-22 DIAGNOSIS — M179 Osteoarthritis of knee, unspecified: Secondary | ICD-10-CM | POA: Diagnosis not present

## 2014-09-22 NOTE — Assessment & Plan Note (Signed)
We have had her in a custom orthotic since 2011  This helped resolve her forefoot pain and she gradually has lost the calluses  Current orthotics are evaluated and are in excellent condition  Continue using these and we will replace them as needed

## 2014-09-22 NOTE — Assessment & Plan Note (Signed)
Doing well  Continue compression sleeve  Continue medications for inflammation  Improve her hip abduction strength  Recheck when necessary

## 2014-09-22 NOTE — Progress Notes (Signed)
Patient ID: Yesenia Jones, female   DOB: 10-Mar-1946, 69 y.o.   MRN: [id number]  RT knee now w some pain More medial joint line Uses compression sleeve  Lt knee no longer gets the warmth Feels better with compression sleeve  She goes to the gym daily and does exercises for knees core and upper body  She is on osteoporosis treatment  Left foot feels much better No longer red or swollen Occasional tingling over the dorsal midfoot Orthotics are very helpful   Examination No acute distress BP 125/65 mmHg  Pulse 67  Ht 5\' 5"  (1.651 m)  Wt 124 lb (56.246 kg)  BMI 20.63 kg/m2  Right knee Knee: Normal to inspection with no erythema or effusion or obvious bony abnormalities. Palpation - no warmth  Mild medial joint line tenderness  no patellar tenderness or condyle tenderness. ROM normal in flexion and extension and lower leg rotation. Ligaments with solid consistent endpoints including ACL, PCL, LCL, MCL. Negative Mcmurray's and provocative meniscal tests. Non painful patellar compression. Patellar and quadriceps tendons unremarkable. Hamstring and quadriceps strength is normal.  Left knee Midline scar No warmth to palpation today and no effusion Full extension but flexion is limited to about 135 VMO strength is improved but slightly weaker than the right  Left hip abduction weakness  Feet She shows some forefoot and transverse arch changes Mild bunionette on the right Forefoot calluses have gradually resolved  Moderate preservation of the longitudinal arches

## 2014-09-22 NOTE — Patient Instructions (Signed)
Left hip strength needs improved  Lateral leg lifts Lateral step ups Cross over step ups  Use compression on both knees  Orthotics are good  Try weaning off some of gabapentin Drop 1 pill in middle of day for 2 weeks If no difference in symptoms - drop both pills in middle of day  Later we may try to get you on gabapentin just at night time  See me when you want to reassess this

## 2014-11-19 ENCOUNTER — Telehealth: Payer: Self-pay | Admitting: Family Medicine

## 2014-11-19 ENCOUNTER — Other Ambulatory Visit (INDEPENDENT_AMBULATORY_CARE_PROVIDER_SITE_OTHER): Payer: Medicare Other

## 2014-11-19 DIAGNOSIS — E785 Hyperlipidemia, unspecified: Secondary | ICD-10-CM | POA: Diagnosis not present

## 2014-11-19 LAB — COMPREHENSIVE METABOLIC PANEL
ALT: 12 U/L (ref 0–35)
AST: 16 U/L (ref 0–37)
Albumin: 3.9 g/dL (ref 3.5–5.2)
Alkaline Phosphatase: 36 U/L — ABNORMAL LOW (ref 39–117)
BUN: 16 mg/dL (ref 6–23)
CHLORIDE: 105 meq/L (ref 96–112)
CO2: 28 mEq/L (ref 19–32)
CREATININE: 0.76 mg/dL (ref 0.40–1.20)
Calcium: 9.8 mg/dL (ref 8.4–10.5)
GFR: 80.49 mL/min (ref >60.00)
GLUCOSE: 95 mg/dL (ref 70–99)
Potassium: 4.2 mEq/L (ref 3.5–5.1)
SODIUM: 138 meq/L (ref 135–145)
Total Bilirubin: 0.4 mg/dL (ref 0.2–1.2)
Total Protein: 6.8 g/dL (ref 6.0–8.3)

## 2014-11-19 LAB — LIPID PANEL
CHOLESTEROL: 177 mg/dL (ref 0–200)
HDL: 57.4 mg/dL (ref >39.00)
LDL Cholesterol: 108 mg/dL — ABNORMAL HIGH (ref 0–99)
NonHDL: 119.6
Total CHOL/HDL Ratio: 3
Triglycerides: 60 mg/dL (ref 0.0–149.0)
VLDL: 12 mg/dL (ref 0.0–40.0)

## 2014-11-19 NOTE — Telephone Encounter (Signed)
-----   Message from [name] sent at 11/16/2014  4:10 PM EDT ----- Regarding: Lab orders for Friday, 7.8.16 Lab orders, no f/u appt

## 2014-12-21 ENCOUNTER — Other Ambulatory Visit: Payer: Self-pay | Admitting: Sports Medicine

## 2014-12-22 ENCOUNTER — Other Ambulatory Visit: Payer: Self-pay | Admitting: *Deleted

## 2014-12-22 ENCOUNTER — Telehealth: Payer: Self-pay | Admitting: *Deleted

## 2014-12-22 MED ORDER — GABAPENTIN 300 MG PO CAPS: ORAL_CAPSULE | ORAL | Status: DC

## 2014-12-22 NOTE — Telephone Encounter (Signed)
Pt called and said she was taking gabapentin 2 in the morning and 2 at night, was on 2 tablets TID, but has cut out the afternoon doses. Called and updated the pharmacy of the dosage change.

## 2015-01-10 ENCOUNTER — Encounter: Payer: Self-pay | Admitting: Family Medicine

## 2015-01-13 ENCOUNTER — Other Ambulatory Visit: Payer: Self-pay | Admitting: Family Medicine

## 2015-01-13 MED ORDER — ATORVASTATIN CALCIUM 20 MG PO TABS: 20.0000 mg | ORAL_TABLET | Freq: Every day | ORAL | Status: DC

## 2015-03-01 ENCOUNTER — Encounter: Payer: Self-pay | Admitting: Cardiology

## 2015-03-08 DIAGNOSIS — Z23 Encounter for immunization: Secondary | ICD-10-CM | POA: Diagnosis not present

## 2015-03-17 DIAGNOSIS — H903 Sensorineural hearing loss, bilateral: Secondary | ICD-10-CM | POA: Diagnosis not present

## 2015-03-17 DIAGNOSIS — H6983 Other specified disorders of Eustachian tube, bilateral: Secondary | ICD-10-CM | POA: Diagnosis not present

## 2015-03-17 DIAGNOSIS — H9313 Tinnitus, bilateral: Secondary | ICD-10-CM | POA: Diagnosis not present

## 2015-03-17 DIAGNOSIS — J301 Allergic rhinitis due to pollen: Secondary | ICD-10-CM | POA: Diagnosis not present

## 2015-03-21 ENCOUNTER — Other Ambulatory Visit: Payer: Self-pay | Admitting: Family Medicine

## 2015-03-21 ENCOUNTER — Encounter: Payer: Self-pay | Admitting: Family Medicine

## 2015-03-21 ENCOUNTER — Telehealth: Payer: Self-pay

## 2015-03-21 DIAGNOSIS — M810 Age-related osteoporosis without current pathological fracture: Secondary | ICD-10-CM

## 2015-03-21 NOTE — Telephone Encounter (Signed)
Yesenia Jones from [facility] left v/m requesting order for bone density sent to breast center so Yesenia Jones can schedule appt for pt. Last bone density 04/22/2013; last annual exam 05/28/14.

## 2015-03-22 ENCOUNTER — Other Ambulatory Visit: Payer: Self-pay

## 2015-03-22 DIAGNOSIS — Z1231 Encounter for screening mammogram for malignant neoplasm of breast: Secondary | ICD-10-CM

## 2015-04-12 ENCOUNTER — Other Ambulatory Visit: Payer: Self-pay | Admitting: *Deleted

## 2015-04-12 MED ORDER — GABAPENTIN 300 MG PO CAPS
ORAL_CAPSULE | ORAL | Status: DC
Start: 2015-04-12 — End: 2015-09-26

## 2015-04-26 ENCOUNTER — Encounter: Payer: Self-pay | Admitting: Family Medicine

## 2015-04-26 MED ORDER — ALENDRONATE SODIUM 70 MG PO TABS
ORAL_TABLET | ORAL | Status: DC
Start: 2015-04-26 — End: 2015-06-17

## 2015-04-28 ENCOUNTER — Ambulatory Visit
Admission: RE | Admit: 2015-04-28 | Discharge: 2015-04-28 | Disposition: A | Discharge status: Home / Self Care | Payer: Medicare Other | Source: Ambulatory Visit

## 2015-04-28 ENCOUNTER — Ambulatory Visit
Admission: RE | Admit: 2015-04-28 | Discharge: 2015-04-28 | Disposition: A | Discharge status: Home / Self Care | Payer: Medicare Other | Source: Ambulatory Visit | Attending: Family Medicine | Admitting: Family Medicine

## 2015-04-28 DIAGNOSIS — Z1231 Encounter for screening mammogram for malignant neoplasm of breast: Secondary | ICD-10-CM | POA: Diagnosis not present

## 2015-04-28 DIAGNOSIS — M810 Age-related osteoporosis without current pathological fracture: Secondary | ICD-10-CM | POA: Diagnosis not present

## 2015-05-03 ENCOUNTER — Ambulatory Visit (INDEPENDENT_AMBULATORY_CARE_PROVIDER_SITE_OTHER): Payer: Medicare Other | Admitting: Family Medicine

## 2015-05-03 ENCOUNTER — Encounter: Payer: Self-pay | Admitting: Family Medicine

## 2015-05-03 VITALS — BP 100/70 | HR 67 | Temp 97.9°F | Ht 65.75 in | Wt 121.5 lb

## 2015-05-03 DIAGNOSIS — M810 Age-related osteoporosis without current pathological fracture: Secondary | ICD-10-CM

## 2015-05-03 NOTE — Assessment & Plan Note (Signed)
Wosredned in last 2 years despite fosamax and intense weight bearing exercise.. Increase ca and vit D to recommended levels. Will look into coverage of prolia. Pt agreeable.

## 2015-05-03 NOTE — Patient Instructions (Addendum)
We will plan looking into prolia.  Stop fosamax.

## 2015-05-03 NOTE — Progress Notes (Signed)
   Subjective:    Patient ID: Yesenia Jones, female    DOB: Aug 25, 1945, 69 y.o.   MRN: [id number]  HPI   69 year old female with history of osteoporosis presents to discuss recent bone mineral density test.  She is currently on fosamax.  On 04/28/15 DEXA showed a statistically sig decrease in femur neck T score to -2.7  Spine is at T -1.5  Osteopenia 02/2009 on fosamax (on for 5-6 years, never diagnosed with osteoporosis)... Stopped in May 2013 when she ran out. IN 2014 her lowest T score was -2.5.   She reports  she has been taking fosamax very regularly, no SE. She is doing 5 days a week of elliptical and bike.   She is taking 1000 mg of calcium a day, and she has been taking only 400 a day of vit D.. Not splitting the dose.  Review of Systems  Constitutional: Negative for fever and fatigue.  HENT: Negative for ear pain.   Eyes: Negative for pain.  Respiratory: Negative for chest tightness and shortness of breath.   Cardiovascular: Negative for chest pain, palpitations and leg swelling.  Gastrointestinal: Negative for abdominal pain.  Genitourinary: Negative for dysuria.       Objective:   Physical Exam  Constitutional: Vital signs are normal. She appears well-developed and well-nourished. She is cooperative.  Non-toxic appearance. She does not appear ill. No distress.  Thin female in NAD  HENT:  Head: Normocephalic.  Right Ear: Hearing, tympanic membrane, external ear and ear canal normal. Tympanic membrane is not erythematous, not retracted and not bulging.  Left Ear: Hearing, tympanic membrane, external ear and ear canal normal. Tympanic membrane is not erythematous, not retracted and not bulging.  Nose: No mucosal edema or rhinorrhea. Right sinus exhibits no maxillary sinus tenderness and no frontal sinus tenderness. Left sinus exhibits no maxillary sinus tenderness and no frontal sinus tenderness.  Mouth/Throat: Uvula is midline, oropharynx is clear and moist and  mucous membranes are normal.  Eyes: Conjunctivae, EOM and lids are normal. Pupils are equal, round, and reactive to light. Lids are everted and swept, no foreign bodies found.  Neck: Trachea normal and normal range of motion. Neck supple. Carotid bruit is not present. No thyroid mass and no thyromegaly present.  Cardiovascular: Normal rate, regular rhythm, S1 normal, S2 normal, normal heart sounds, intact distal pulses and normal pulses.  Exam reveals no gallop and no friction rub.   No murmur heard. Pulmonary/Chest: Effort normal and breath sounds normal. No tachypnea. No respiratory distress. She has no decreased breath sounds. She has no wheezes. She has no rhonchi. She has no rales.  Abdominal: Soft. Normal appearance and bowel sounds are normal. There is no tenderness.  Neurological: She is alert.  Skin: Skin is warm, dry and intact. No rash noted.  Psychiatric: Her speech is normal and behavior is normal. Judgment and thought content normal. Her mood appears not anxious. Cognition and memory are normal. She does not exhibit a depressed mood.          Assessment & Plan:

## 2015-05-03 NOTE — Progress Notes (Signed)
Pre visit review using our clinic review tool, if applicable. No additional management support is needed unless otherwise documented below in the visit note. 

## 2015-05-17 ENCOUNTER — Telehealth: Payer: Self-pay | Admitting: Family Medicine

## 2015-05-17 NOTE — Telephone Encounter (Signed)
I have electronically submitted pt's info for Prolia insurance verification and will notify you once I have a response. Thank you. °

## 2015-05-23 NOTE — Telephone Encounter (Signed)
I have rec'd Ms. Huss's insurance verification for Prolia, however, Alinda Deem is requiring a prior authorization.  I have completed most of the p/a form and placed on your desk, but section G needs to be completed by either you or Dr. Ermalene Searing and Dr. Daphine Deutscher signature is required at bottom of form.  Once complete, please return to me. Thank you.

## 2015-05-23 NOTE — Telephone Encounter (Signed)
Prolia PA placed in Dr. Daphine Deutscher in box to complete.

## 2015-05-24 ENCOUNTER — Encounter: Payer: Self-pay | Admitting: Family Medicine

## 2015-05-24 NOTE — Telephone Encounter (Signed)
Did you send documentation w/the form?  Also, I need the form back please.  Thank you.

## 2015-05-24 NOTE — Telephone Encounter (Signed)
Completed note and in outbox.

## 2015-05-24 NOTE — Telephone Encounter (Signed)
PA faxed to [employer] at [phone].

## 2015-05-24 NOTE — Telephone Encounter (Signed)
No, I just faxed the form.  The original was placed in folder to be scanned.

## 2015-05-24 NOTE — Telephone Encounter (Signed)
I was able to catch it before it went out to scan.  Since you faxed the form, I will hold off on sending the back-up documentation (don't want to cause confusion for them) and if it's denied, then I will re-send the p/a form along w/the documentation. I just may take a few days longer if that happens.

## 2015-05-30 NOTE — Telephone Encounter (Signed)
Cala Bradford w/[employer] called requesting clinicals for the Prolia p/a.  She says since there were no clinicals w/the p/a form this may delay the process.  I faxed most recent Dexa scan from 04/28/2015 to the attn of Specialty Pre Certification.  I will notify you once they respond back. Thank you.

## 2015-05-31 NOTE — Telephone Encounter (Signed)
Noted  

## 2015-06-08 ENCOUNTER — Encounter: Payer: Self-pay | Admitting: Family Medicine

## 2015-06-08 NOTE — Telephone Encounter (Signed)
Will need a calcium level prior to receiving injection.

## 2015-06-13 ENCOUNTER — Other Ambulatory Visit (INDEPENDENT_AMBULATORY_CARE_PROVIDER_SITE_OTHER): Payer: Medicare HMO

## 2015-06-13 ENCOUNTER — Telehealth: Payer: Self-pay | Admitting: Family Medicine

## 2015-06-13 ENCOUNTER — Other Ambulatory Visit: Payer: Self-pay | Admitting: Family Medicine

## 2015-06-13 DIAGNOSIS — E785 Hyperlipidemia, unspecified: Secondary | ICD-10-CM

## 2015-06-13 DIAGNOSIS — M810 Age-related osteoporosis without current pathological fracture: Secondary | ICD-10-CM | POA: Diagnosis not present

## 2015-06-13 DIAGNOSIS — Z1159 Encounter for screening for other viral diseases: Secondary | ICD-10-CM

## 2015-06-13 LAB — COMPREHENSIVE METABOLIC PANEL
ALBUMIN: 4.2 g/dL (ref 3.5–5.2)
ALK PHOS: 43 U/L (ref 39–117)
ALT: 18 U/L (ref 0–35)
AST: 19 U/L (ref 0–37)
BUN: 17 mg/dL (ref 6–23)
CHLORIDE: 103 meq/L (ref 96–112)
CO2: 29 mEq/L (ref 19–32)
Calcium: 10 mg/dL (ref 8.4–10.5)
Creatinine, Ser: 0.9 mg/dL (ref 0.40–1.20)
GFR: 66.11 mL/min (ref >60.00)
Glucose, Bld: 107 mg/dL — ABNORMAL HIGH (ref 70–99)
POTASSIUM: 4.2 meq/L (ref 3.5–5.1)
Sodium: 139 mEq/L (ref 135–145)
TOTAL PROTEIN: 7.2 g/dL (ref 6.0–8.3)
Total Bilirubin: 0.5 mg/dL (ref 0.2–1.2)

## 2015-06-13 LAB — LIPID PANEL
CHOLESTEROL: 137 mg/dL (ref 0–200)
HDL: 63.4 mg/dL (ref >39.00)
LDL CALC: 61 mg/dL (ref 0–99)
NonHDL: 74.01
TRIGLYCERIDES: 63 mg/dL (ref 0.0–149.0)
Total CHOL/HDL Ratio: 2
VLDL: 12.6 mg/dL (ref 0.0–40.0)

## 2015-06-13 LAB — VITAMIN D 25 HYDROXY (VIT D DEFICIENCY, FRACTURES): VITD: 71.48 ng/mL (ref 30.00–100.00)

## 2015-06-13 LAB — HEPATITIS C ANTIBODY: HCV Ab: NEGATIVE

## 2015-06-13 NOTE — Telephone Encounter (Signed)
-----   Message from [name] sent at 06/06/2015 11:25 AM EST ----- Regarding: Lab orders for 1.30.17 Patient is scheduled for CPX labs, please order future labs, Thanks , Camelia Eng

## 2015-06-16 NOTE — Telephone Encounter (Signed)
I rec'd a call from [facility] w/[employer] and Ms. Schrupp has been approved for Prolia, effective 05/30/2015-05/29/2016, Berkley Harvey #[id number].   Whether an OV is billed or not patient will have an estimated responsibility of $10 co-pay.  Please make pt aware this is an estimate and we will not know an exact amt until her insurance pays.  I have sent a copy of the summary of benefits to be scanned into her chart.  Once Ms. Shibata rec's her injection, please send me her actual injection date.  If you have any questions, please let me know.  Thank you.   Cc: Dee for ordering purposes

## 2015-06-16 NOTE — Telephone Encounter (Signed)
[name] notified as instructed by telephone.  She is scheduled to see Dr. Ermalene Searing tomorrow.  Calcium level normal on labs drawn on 06/13/2015.  We actually had an extra Prolia in stock so [name] notified that we can start in injections tomorrow.

## 2015-06-17 ENCOUNTER — Ambulatory Visit (INDEPENDENT_AMBULATORY_CARE_PROVIDER_SITE_OTHER): Payer: Medicare HMO | Admitting: Family Medicine

## 2015-06-17 ENCOUNTER — Encounter: Payer: Self-pay | Admitting: Family Medicine

## 2015-06-17 VITALS — BP 90/66 | HR 72 | Temp 98.4°F | Ht 65.5 in | Wt 123.5 lb

## 2015-06-17 DIAGNOSIS — Z0001 Encounter for general adult medical examination with abnormal findings: Secondary | ICD-10-CM | POA: Diagnosis not present

## 2015-06-17 DIAGNOSIS — M546 Pain in thoracic spine: Secondary | ICD-10-CM

## 2015-06-17 DIAGNOSIS — E785 Hyperlipidemia, unspecified: Secondary | ICD-10-CM | POA: Diagnosis not present

## 2015-06-17 DIAGNOSIS — R7303 Prediabetes: Secondary | ICD-10-CM

## 2015-06-17 DIAGNOSIS — Z0000 Encounter for general adult medical examination without abnormal findings: Secondary | ICD-10-CM

## 2015-06-17 DIAGNOSIS — Z7189 Other specified counseling: Secondary | ICD-10-CM

## 2015-06-17 DIAGNOSIS — M810 Age-related osteoporosis without current pathological fracture: Secondary | ICD-10-CM

## 2015-06-17 DIAGNOSIS — M549 Dorsalgia, unspecified: Secondary | ICD-10-CM

## 2015-06-17 MED ORDER — CYCLOBENZAPRINE HCL 10 MG PO TABS: 5.0000 mg | ORAL_TABLET | Freq: Every evening | ORAL | Status: DC | PRN

## 2015-06-17 MED ORDER — DENOSUMAB 60 MG/ML ~~LOC~~ SOLN
60.0000 mg | Freq: Once | SUBCUTANEOUS | Status: AC
Start: 2015-06-17 — End: 2015-06-17
  Administered 2015-06-17: 60 mg via SUBCUTANEOUS

## 2015-06-17 NOTE — Assessment & Plan Note (Signed)
Now at goal on atorvastatin 20 mg daily. No SE.

## 2015-06-17 NOTE — Progress Notes (Signed)
Pre visit review using our clinic review tool, if applicable. No additional management support is needed unless otherwise documented below in the visit note. 

## 2015-06-17 NOTE — Patient Instructions (Addendum)
Start gentle stretching of upper back. Heat, massage.  Ibuprofen 800 mg every 8 hours for pain and inflammation.  Can use muscle relaxant as needed.  Hold upper body weight for now. Avoid heavy lifting. Decrease carbs in diet.

## 2015-06-17 NOTE — Addendum Note (Signed)
Addended by: Damita Lack on: 06/17/2015 03:44 PM   Modules accepted: Orders

## 2015-06-17 NOTE — Assessment & Plan Note (Signed)
NSAIDS, muscle relaxant, heat and gentle stretches.

## 2015-06-17 NOTE — Progress Notes (Signed)
I have personally reviewed the Medicare Annual Wellness questionnaire and have noted 1. The patient's medical and social history 2. Their use of alcohol, tobacco or illicit drugs 3. Their current medications and supplements 4. The patient's functional ability including ADL's, fall risks, home safety risks and hearing or visual             impairment. 5. Diet and physical activities 6. Evidence for depression or mood disorders 7.         Updated provider list Cognitive evaluation was performed and recorded on pt medicare questionnaire form. The patients weight, height, BMI and visual acuity have been recorded in the chart  I have made referrals, counseling and provided education to the patient based review of the above and I have provided the pt with a written personalized care plan for preventive services.   Husband with bladder cancer, stable currently. Now on q6 months injections. Less stress this year. Mood better overall.  She is doing well overall.  She has been having some pain in upper back, sore muscle in last few days.  She has been treating with heat.  Spasming .  She has not injury but has been doing more weights lately. She has tried tylenol.Marland Kitchen  Helped some.   Hyperlipidemia:At goal LDL < 130 on  20 atorvastatin.  No SE. Lab Results  Component Value Date   CHOL 137 06/13/2015   HDL 63.40 06/13/2015   LDLCALC 61 06/13/2015   LDLDIRECT 149.3 04/06/2013   TRIG 63.0 06/13/2015   CHOLHDL 2 06/13/2015   Diet compliance: moderate Exercise: elliptical, weights, bike Wt Readings from Last 3 Encounters:  06/17/15 123 lb 8 oz (56.019 kg)  05/03/15 121 lb 8 oz (55.112 kg)  09/22/14 124 lb (56.246 kg)    Prediabetes: 107 glucose.  No change in diet lately.   Has daily fruit.  Followed by Dr. Darrick Penna for knee pain as well as peroneal injury. She is using gabapentin has helped with nerve symptoms.  Review of Systems  Constitutional: Negative for fever, fatigue and  unexpected weight change.  HENT: Negative for ear pain, congestion, sore throat, sneezing, trouble swallowing and sinus pressure.  Eyes: Negative for pain and itching.  Respiratory: Negative for cough, shortness of breath and wheezing.  Cardiovascular: Negative for chest pain, palpitations and leg swelling.  Gastrointestinal: Negative for nausea, abdominal pain, diarrhea, constipation and blood in stool.  Genitourinary: Negative for dysuria, hematuria, vaginal discharge, difficulty urinating and menstrual problem.  Skin: Negative for rash.  Neurological: Negative for syncope, weakness, light-headedness, numbness and headaches.  Psychiatric/Behavioral: Negative for confusion and dysphoric mood. The patient is not nervous/anxious.  Objective:   Physical Exam  Constitutional: Vital signs are normal. She appears well-developed and well-nourished. She is cooperative. Non-toxic appearance. She does not appear ill. No distress.  HENT:  Head: Normocephalic.  Right Ear: Hearing, tympanic membrane, external ear and ear canal normal.  Left Ear: Hearing, tympanic membrane, external ear and ear canal normal.  Nose: Nose normal.   Eyes: Conjunctivae normal, EOM and lids are normal. Pupils are equal, round, and reactive to light. No foreign bodies found.  Neck: Trachea normal and normal range of motion. Neck supple. Carotid bruit is not present. No mass and no thyromegaly present.  Cardiovascular: Normal rate, regular rhythm, S1 normal, S2 normal, normal heart sounds and intact distal pulses. Exam reveals no gallop.  No murmur heard.  Pulmonary/Chest: Effort normal and breath sounds normal. No respiratory distress. She has no wheezes. She has  no rhonchi. She has no rales.  Abdominal: Soft. Normal appearance and bowel sounds are normal. She exhibits no distension, no fluid wave, no abdominal bruit and no mass. There is no hepatosplenomegaly. There is no tenderness. There is no rebound, no  guarding and no CVA tenderness. No hernia.  Genitourinary:  DVE not performed.no breast mass, no nipple changes Lymphadenopathy:  She has no cervical adenopathy.  She has no axillary adenopathy.  Neurological: She is alert. She has normal strength. No cranial nerve deficit or sensory deficit.  Skin: Skin is warm, dry and intact. No rash noted.  Psychiatric: Her speech is normal and behavior is normal. Judgment normal. Her mood appears not anxious. Cognition and memory are normal. She does not exhibit a depressed mood.  Assessment & Plan:   Complete Physical Exam: The patient's preventative maintenance and recommended screening tests for an annual wellness exam were reviewed in full today.  Brought up to date unless services declined.  Counselled on the importance of diet, exercise, and its role in overall health and mortality.  The patient's FH and SH was reviewed, including their home life, tobacco status, and drug and alcohol status.   Vaccines:  Up to date  Except due for second pneumovax. Colon: 04/2010 no polyps.. Repeat in 10 years. Dr. Juanda Chance.  Mammo:Last 04/2015 nml. DXA: osteopenia 02/2009 on fosamax (on for 5-6 years, never diagnosed with osteoporosis)... Stopped in May 2013 when she ran out.Marland Kitchen DEXA 04/22/2013: osteoporosis, started back on fosamax. Recheck in 04/2015: worsened.. Started on  prolia 05/2015 PAP/DVE: Previously done at [facility], but they retired. Last PAP done nml 2011, all previous were normal 3 in a row,has history of abnormal pap and crypothrapy 20 years ago, No further paps needed, plan every other year DVE. Asymptomatic.  Hep C:neg

## 2015-06-17 NOTE — Assessment & Plan Note (Signed)
Work on low carb diet. 

## 2015-06-17 NOTE — Assessment & Plan Note (Signed)
Starting prolia.

## 2015-06-17 NOTE — Assessment & Plan Note (Signed)
HCPOA, living will and full code (reviewed 2017) Info packet given

## 2015-07-06 DIAGNOSIS — H2513 Age-related nuclear cataract, bilateral: Secondary | ICD-10-CM | POA: Diagnosis not present

## 2015-09-26 ENCOUNTER — Other Ambulatory Visit: Payer: Self-pay | Admitting: *Deleted

## 2015-09-26 MED ORDER — GABAPENTIN 300 MG PO CAPS: ORAL_CAPSULE | ORAL | Status: DC

## 2015-10-26 ENCOUNTER — Other Ambulatory Visit: Payer: Self-pay | Admitting: *Deleted

## 2015-10-26 MED ORDER — GABAPENTIN 300 MG PO CAPS: ORAL_CAPSULE | ORAL | Status: DC

## 2015-12-04 ENCOUNTER — Encounter: Payer: Self-pay | Admitting: Family Medicine

## 2015-12-06 NOTE — Telephone Encounter (Signed)
Yesenia Jones.. This pt has started on prolia.  Can you call her to answer her  MyChart questions about how we do prolia here.   She needs it q6 months.  Also find out  dose of lyrica and send in 3 month refill to pharmacy she wants.

## 2015-12-07 MED ORDER — ATORVASTATIN CALCIUM 20 MG PO TABS: 20.0000 mg | ORAL_TABLET | Freq: Every day | ORAL | 5 refills | Status: DC

## 2015-12-13 ENCOUNTER — Telehealth: Payer: Self-pay | Admitting: Family Medicine

## 2015-12-13 DIAGNOSIS — M810 Age-related osteoporosis without current pathological fracture: Secondary | ICD-10-CM

## 2015-12-13 NOTE — Telephone Encounter (Signed)
I have electronically submitted pt's info for Prolia insurance verification and will notify you once I have a response. Thank you. °

## 2015-12-15 ENCOUNTER — Encounter: Payer: Self-pay | Admitting: Family Medicine

## 2016-01-02 ENCOUNTER — Other Ambulatory Visit (INDEPENDENT_AMBULATORY_CARE_PROVIDER_SITE_OTHER): Payer: Medicare HMO

## 2016-01-02 DIAGNOSIS — M810 Age-related osteoporosis without current pathological fracture: Secondary | ICD-10-CM

## 2016-01-02 LAB — BASIC METABOLIC PANEL
BUN: 14 mg/dL (ref 6–23)
CHLORIDE: 101 meq/L (ref 96–112)
CO2: 29 mEq/L (ref 19–32)
Calcium: 10.1 mg/dL (ref 8.4–10.5)
Creatinine, Ser: 0.95 mg/dL (ref 0.40–1.20)
GFR: 62.01 mL/min (ref >60.00)
GLUCOSE: 114 mg/dL — AB (ref 70–99)
POTASSIUM: 3.5 meq/L (ref 3.5–5.1)
Sodium: 136 mEq/L (ref 135–145)

## 2016-01-02 NOTE — Telephone Encounter (Signed)
Left message for Yesenia Jones to return my call. 

## 2016-01-02 NOTE — Telephone Encounter (Signed)
Patient returned Donna's call.Please call patient back at home [phone].

## 2016-01-02 NOTE — Telephone Encounter (Signed)
I have rec'd Yesenia Jones's insurance verification for Prolia and she will have an estimated responsibility of $45.  Please make pt aware this is an estimate and we will not know an exact amt until insurance(s) has/have paid.  I have sent a copy of the summary of benefits to be scanned into pt's chart.  Yesenia Jones currently has a p/a on file [phone], effective 05/30/2015-05/29/2016.    Once pt recs injection, please let me know actual injection date so I can update the Prolia portal.  If you have any questions, please let me know.  Thank you!

## 2016-01-02 NOTE — Telephone Encounter (Signed)
Yesenia Jones notified as instructed by telephone.  She will come in today to have a BMET drawn.  If calcium level is normal, we will schedule a nurse visit for her second Prolia injection.

## 2016-01-03 ENCOUNTER — Ambulatory Visit (INDEPENDENT_AMBULATORY_CARE_PROVIDER_SITE_OTHER): Payer: Medicare HMO | Admitting: Family Medicine

## 2016-01-03 DIAGNOSIS — M810 Age-related osteoporosis without current pathological fracture: Secondary | ICD-10-CM | POA: Diagnosis not present

## 2016-01-03 MED ORDER — DENOSUMAB 60 MG/ML ~~LOC~~ SOLN
60.0000 mg | Freq: Once | SUBCUTANEOUS | Status: AC
  Administered 2016-01-03: 60 mg via SUBCUTANEOUS

## 2016-01-03 MED ORDER — DENOSUMAB 60 MG/ML ~~LOC~~ SOLN: 60.0000 mg | Freq: Once | SUBCUTANEOUS | 0 refills | Status: AC

## 2016-01-03 NOTE — Progress Notes (Signed)
Prolia given.  Kerby Nora, MD [facility]

## 2016-02-07 ENCOUNTER — Ambulatory Visit (INDEPENDENT_AMBULATORY_CARE_PROVIDER_SITE_OTHER): Payer: Medicare HMO | Admitting: Sports Medicine

## 2016-02-07 ENCOUNTER — Other Ambulatory Visit: Payer: Self-pay

## 2016-02-07 VITALS — BP 130/60 | Ht 66.0 in | Wt 125.0 lb

## 2016-02-07 DIAGNOSIS — R208 Other disturbances of skin sensation: Secondary | ICD-10-CM | POA: Insufficient documentation

## 2016-02-07 DIAGNOSIS — M7731 Calcaneal spur, right foot: Secondary | ICD-10-CM | POA: Diagnosis not present

## 2016-02-07 DIAGNOSIS — M722 Plantar fascial fibromatosis: Secondary | ICD-10-CM

## 2016-02-07 DIAGNOSIS — M7730 Calcaneal spur, unspecified foot: Secondary | ICD-10-CM | POA: Insufficient documentation

## 2016-02-07 DIAGNOSIS — G5753 Tarsal tunnel syndrome, bilateral lower limbs: Secondary | ICD-10-CM | POA: Insufficient documentation

## 2016-02-07 DIAGNOSIS — M79671 Pain in right foot: Secondary | ICD-10-CM

## 2016-02-07 DIAGNOSIS — R200 Anesthesia of skin: Secondary | ICD-10-CM | POA: Insufficient documentation

## 2016-02-07 MED ORDER — GABAPENTIN 300 MG PO CAPS: 600.0000 mg | ORAL_CAPSULE | Freq: Three times a day (TID) | ORAL | 6 refills | Status: DC

## 2016-02-07 NOTE — Progress Notes (Signed)
   Subjective:    Patient ID: Yesenia Jones, female    DOB: 1945-09-03, 70 y.o.   MRN: [id number]  HPI Yesenia Jones is a 70 y.o. female that presents to [facility] for right heel pain and left foot numbness.    1. Right heel pain Patient reports that heel pain began about 1.5 months ago.  She reports that pain is worse after resting/ being seated for prolonged periods of time.  She notes that she has been using ice and motrin with minimal relief.  She reports that wearing her orthotics helps the pain.  She describes pain as sharp.  No history of injury.  Pain is preventing her from participating in gym activities like elliptical, heel press and stationary bike.  No numbness, weakness, falls.  2.  Dorsal foot numbness, left Patient reports that numbness has been present since her total knee replacement about 5 years ago.  She is on Neurontin 600mg  BID.  She was on a TID schedule but has been trying to wean off.  She reports that neuropathy is worse since decreasing dose.  She'd like to resume TID dose.  Denies excessive sedation, falls, pain in left knee.  Review of Systems Per HPI    Objective:   Physical Exam Gen: awake, well appearing female, NAD, accompanied to visit by husband MSK: normal stance, longitudinal arches moderately cavus shape, some midfoot changes, particullary TMT joint bossing noted on RT appreciated.  Full AROM, left knee with well healed scar, no effusion, +TTP to plantar aspect of the calcaneus Skin: no erythema or ecchymosis Neuro: light touch sensation in tact on right foot, left foot with decreased sensation along the dorsal aspect of the mid foot.  Ultrasound: Right foot: Small calcaneal spur appreciated.  Plantar fascia insertion measuring about 0.63.  Area of hypoechoity appreciated above the plantar fascia.  Plantar fascia near mid arch measuring 0.3.  Blood flow normal. Left foot: Plantar fascia measures 0.34.at insertion  Summary: Scan consistent with plantar  fasciitis with spurring on RT    Assessment & Plan:   Heel spur Appreciated on ultrasound of the right plantar fascia.  Support added to current orthotics, which are in great condition.  Patient to avoid irritating activities, like heel press, to avoid increased pain at this area.  Discussed possibility for injection to this area.  Patient defers this for now but will consider if pain does not improve with orthotics change/ change in activity.  Plantar fasciitis Of right foot.  Plantar fascia about twice as thick on right compared to left, suggesting irritation/ inflammation.  Normal blood flow on ultrasound.  HEP for plantar fascitis reviewed and handout provided.  Arch strap provided.  Encouraged ICE prn pain.  Avoid aggravating activities.  Numbness of left foot Likely overstretched nerve at time of knee replacement.  Discussed that this is likely a permanent change.  Increase in Neurontin frequency is reasonable.  New rx for Neurontin sent to pharmacy.   Yesenia Sherley M. , DO PGY-3, [facility]

## 2016-02-07 NOTE — Assessment & Plan Note (Signed)
Appreciated on ultrasound of the right plantar fascia.  Support added to current orthotics, which are in great condition.  Patient to avoid irritating activities, like heel press, to avoid increased pain at this area.  Discussed possibility for injection to this area.  Patient defers this for now but will consider if pain does not improve with orthotics change/ change in activity.

## 2016-02-07 NOTE — Assessment & Plan Note (Addendum)
Of right foot.  Plantar fascia about twice as thick on right compared to left, suggesting irritation/ inflammation.  Normal blood flow on ultrasound.  HEP for plantar fascitis reviewed and handout provided.  Arch strap provided.  Encouraged ICE prn pain.  Avoid aggravating activities.

## 2016-02-07 NOTE — Assessment & Plan Note (Signed)
Likely overstretched nerve at time of knee replacement.  Discussed that this is likely a permanent change.  Increase in Neurontin frequency is reasonable.  New rx for Neurontin sent to pharmacy.

## 2016-02-27 DIAGNOSIS — R69 Illness, unspecified: Secondary | ICD-10-CM | POA: Diagnosis not present

## 2016-03-27 ENCOUNTER — Other Ambulatory Visit: Payer: Self-pay | Admitting: Family Medicine

## 2016-03-27 DIAGNOSIS — Z1231 Encounter for screening mammogram for malignant neoplasm of breast: Secondary | ICD-10-CM

## 2016-04-03 ENCOUNTER — Encounter: Payer: Self-pay | Admitting: Sports Medicine

## 2016-04-03 ENCOUNTER — Ambulatory Visit (INDEPENDENT_AMBULATORY_CARE_PROVIDER_SITE_OTHER): Payer: Medicare HMO | Admitting: Sports Medicine

## 2016-04-03 ENCOUNTER — Ambulatory Visit: Payer: Self-pay

## 2016-04-03 VITALS — BP 132/70 | Ht 65.0 in | Wt 124.0 lb

## 2016-04-03 DIAGNOSIS — M79671 Pain in right foot: Secondary | ICD-10-CM

## 2016-04-03 DIAGNOSIS — S82831A Other fracture of upper and lower end of right fibula, initial encounter for closed fracture: Secondary | ICD-10-CM | POA: Diagnosis not present

## 2016-04-03 DIAGNOSIS — S82401A Unspecified fracture of shaft of right fibula, initial encounter for closed fracture: Secondary | ICD-10-CM | POA: Insufficient documentation

## 2016-04-03 NOTE — Assessment & Plan Note (Signed)
We will use an Aircast for the next 6 weeks  Icing  She is allowed to weight-bear  Other activities as tolerated  Recheck 4 weeks

## 2016-04-03 NOTE — Progress Notes (Signed)
  Yesenia Jones - 70 y.o. female MRN [id number]  Date of birth: Mar 01, 1946  SUBJECTIVE:  Including CC & ROS.  No chief complaint on file. Ashely Goosby [phone] female presenting with right ankle pain and right lateral lower leg pain onset 5 days ago after a fall. Pt reports that she is unsure of the mechanism of fall but states she was carrying a bag of clothes and fell causing herself to hit the right frontal area of her scalp and injury to her right lower leg. Pt mentions that she has been able to walk since the incident but the pain at the right ankle and lower leg has persisted. She mentions that she has tried rest, ice and nsaid therapy with minor relief. Denies numbness or tingling in right foot.      HISTORY: Past Medical, Surgical, Social, and Family History Reviewed & Updated per EMR.   Pertinent Historical Findings include: PMSHx -  Plantar fasciitis   Osteopenia PSHx -   FHx -   Medications - reviewed and are as listed  ROS Pain with weight bearing Swelling Now discoloration into foot  DATA REVIEWED: Prior u/s  PHYSICAL EXAM:  VS: BP:132/70  HR: bpm  TEMP: ( )  RESP:   HT:5\' 5"  (165.1 cm)   WT:124 lb (56.2 kg)  BMI:20.7 PHYSICAL EXAM: Gen: NAD, alert, cooperative with exam, well-appearing HEENT: clear conjunctiva, EOMI CV:  no edema, capillary refill brisk,  Resp: non-labored, normal speech Skin: no rashes, normal turgor  Neuro: no gross deficits.  Psych:  alert and oriented  RT ankle and foot Discoloration starting 6 cm proximal to lat malleolus Discoloration extends down into the foot primarily on the lateral side Tenderness to palpation over the last 6 cm of the lateral malleolus anterior and posterior Mild tenderness on the medial side but no direct bony tenderness Ligaments and ankle motion are normal  Ultrasound of right fibula and ankle  There is no effusion noted in the ankle joint The right fibula shows a distal cortical disruption There is a  hypoechoic area suggestive of a hematoma There is a small free fragment about 3-4 mm long Cortical disruption is seen in the lateral, longitudinal and oblique plane  Summary: ultrasound is consistent with a nondisplaced distal fibular fracture that does not involve the ankle joint  Ultrasound and interpretation by Enid Baas MD  ASSESSMENT & PLAN:   No problem-specific Assessment & Plan notes found for this encounter. See Problem list  I observed and examined the patient with the medical student and agree with assessment and plan.  Note reviewed and modified by me.  Enid Baas, MD

## 2016-04-03 NOTE — Patient Instructions (Signed)
You have a small distal fibular fracture Use air cast all the time except take off for showers Ice for swelling Ibuprofen if needed  I need to repeat your [place] scan in 4 weeks  OK to do any exercise that does not hurt  Do some easy motion of the foot up and down and use light resistance

## 2016-04-30 ENCOUNTER — Ambulatory Visit
Admission: RE | Admit: 2016-04-30 | Discharge: 2016-04-30 | Disposition: A | Discharge status: Home / Self Care | Payer: Medicare HMO | Source: Ambulatory Visit | Attending: Family Medicine | Admitting: Family Medicine

## 2016-04-30 DIAGNOSIS — Z1231 Encounter for screening mammogram for malignant neoplasm of breast: Secondary | ICD-10-CM

## 2016-05-02 ENCOUNTER — Ambulatory Visit (INDEPENDENT_AMBULATORY_CARE_PROVIDER_SITE_OTHER): Payer: Medicare HMO | Admitting: Sports Medicine

## 2016-05-02 ENCOUNTER — Ambulatory Visit: Payer: Self-pay

## 2016-05-02 ENCOUNTER — Encounter: Payer: Self-pay | Admitting: Sports Medicine

## 2016-05-02 VITALS — BP 108/70 | Ht 65.0 in | Wt 124.0 lb

## 2016-05-02 DIAGNOSIS — S82831D Other fracture of upper and lower end of right fibula, subsequent encounter for closed fracture with routine healing: Secondary | ICD-10-CM

## 2016-05-02 DIAGNOSIS — M79604 Pain in right leg: Secondary | ICD-10-CM | POA: Diagnosis not present

## 2016-05-02 NOTE — Progress Notes (Signed)
  Yesenia Jones - 70 y.o. female MRN [id number]  Date of birth: 09/14/1945  SUBJECTIVE:  Including CC & ROS.   Yesenia Jones is a 70 year old female that is following up for a closed right fibular fracture. She reports that she has occasional pain but has improved overall. She only takes ibuprofen if she has to. She has been wearing the Aircast on a regular basis. She still has some swelling. It has been about 5 weeks since the injury. She denies any numbness or tingling.  ROS: No unexpected weight loss, fever, chills, instability, muscle pain, numbness/tingling, redness, otherwise see HPI    HISTORY: Past Medical, Surgical, Social, and Family History Reviewed & Updated per EMR.   Pertinent Historical Findings include: PMSHx -  Osteopenia.   DATA REVIEWED: Prior US  PHYSICAL EXAM:  VS: BP:108/70  HR: bpm  TEMP: ( )  RESP:   HT:5\' 5"  (165.1 cm)   WT:124 lb (56.2 kg)  BMI:20.7 PHYSICAL EXAM: Gen: NAD, alert, cooperative with exam, well-appearing HEENT: clear conjunctiva, EOMI CV:  no edema, capillary refill brisk,  Resp: non-labored, normal speech Skin: no rashes, normal turgor  Neuro: no gross deficits.  Psych:  alert and oriented Right Foot:  No obvious swelling or ecchymosis. Some tenderness to palpation over the distal fibula. No tenderness palpation over the Achilles. No tenderness to palpation of the peroneal's. Normal range of motion. Normal strength.  Limited ultrasound: Right foot: There was placed on a long axis upon the distal fibula which shows a slight gap still present. The effusion in this area has improved and there is formation of callus to suggest bone healing. There was a decrease in the hypervascularity as seen previously.  Summary: Findings are consistent with a healing nondisplaced distal fibular fracture but does not involve the ankle joint.   ASSESSMENT & PLAN:   Closed right fibular fracture She appears to be healing on ultrasound. - She will wear  the Aircast for another 4 weeks. - A felt pad was placed medially as it was rubbing on her ankle in the area. - When she follows up in 4 weeks we will likely discontinue the Aircast and can ultrasound to monitor for healing.

## 2016-05-03 NOTE — Assessment & Plan Note (Signed)
She appears to be healing on ultrasound. - She will wear the Aircast for another 4 weeks. - A felt pad was placed medially as it was rubbing on her ankle in the area. - When she follows up in 4 weeks we will likely discontinue the Aircast and can ultrasound to monitor for healing.

## 2016-05-29 ENCOUNTER — Ambulatory Visit: Payer: Self-pay

## 2016-05-29 ENCOUNTER — Ambulatory Visit (INDEPENDENT_AMBULATORY_CARE_PROVIDER_SITE_OTHER): Payer: Medicare HMO | Admitting: Sports Medicine

## 2016-05-29 ENCOUNTER — Encounter: Payer: Self-pay | Admitting: Sports Medicine

## 2016-05-29 VITALS — BP 120/80 | Ht 65.0 in | Wt 124.0 lb

## 2016-05-29 DIAGNOSIS — M79604 Pain in right leg: Secondary | ICD-10-CM | POA: Diagnosis not present

## 2016-05-29 DIAGNOSIS — S82831D Other fracture of upper and lower end of right fibula, subsequent encounter for closed fracture with routine healing: Secondary | ICD-10-CM | POA: Diagnosis not present

## 2016-05-29 NOTE — Progress Notes (Signed)
    Subjective:  Yesenia Jones is a 71 y.o. female who presents to the [facility] today with a chief complaint of right fibular fracture follow up.   HPI:  Right Fibula Fracture Follow Up Patient here for follow up of closed distal right fibular fracture. Injury initially occurred about 8 weeks ago. She has been in an aircast since then which she wears almost all of the time. Over the past couple of weeks, she has not noticed any pain in her leg. No numbness or tingling. She has not had to take any antiinflammatory medications.   Pst HX: Left TKR  ROS: Per HPI No swelling in ankle No numbness in foot Objective:  Physical Exam: BP 120/80   Ht 5\' 5"  (1.651 m)   Wt 124 lb (56.2 kg)   BMI 20.63 kg/m   Gen: NAD, resting comfortably MSK: -R Foot: No deformities, swelling, or ecchymosis. Nontender to palpation along lateral malleolus. FROM. Strength 5/5 in all directions at ankle joint.   Walking gait without a limp  Bedside R Foot : Small cortical irregularity at distal fibula noted with adjacent bone fragment - significantly improved since last scan. Less hypoechoic change.  Assessment/Plan:  Closed right fibular fracture Appears to be healing normally. She was able to walk and bear weight in the clinic without her aircast and she has been without pain for 2 weeks. Will discontinue the aircast today. Return precautions reviewed. Follow up as needed.   [place]. Katina Degree, MD [facility] Family Medicine Resident PGY-3  I observed and examined the patient with the resident and agree with assessment and plan.  Note reviewed and modified by me. [facility], MD 05/29/2016 3:55 PM

## 2016-05-29 NOTE — Assessment & Plan Note (Signed)
Appears to be healing normally. She was able to walk and bear weight in the clinic without her aircast and she has been without pain for 2 weeks. Will discontinue the aircast today. Return precautions reviewed. Follow up as needed.

## 2016-06-03 ENCOUNTER — Encounter: Payer: Self-pay | Admitting: Family Medicine

## 2016-06-03 MED ORDER — ATORVASTATIN CALCIUM 20 MG PO TABS: 20.0000 mg | ORAL_TABLET | Freq: Every day | ORAL | 0 refills | Status: DC

## 2016-06-06 ENCOUNTER — Telehealth: Payer: Self-pay | Admitting: Family Medicine

## 2016-06-06 DIAGNOSIS — M810 Age-related osteoporosis without current pathological fracture: Secondary | ICD-10-CM

## 2016-06-06 DIAGNOSIS — R7303 Prediabetes: Secondary | ICD-10-CM

## 2016-06-06 DIAGNOSIS — E782 Mixed hyperlipidemia: Secondary | ICD-10-CM

## 2016-06-06 NOTE — Telephone Encounter (Signed)
-----   Message from [name] sent at 06/05/2016  3:39 PM EST ----- Regarding: lab orders for Wednesday, 1.31.18 Patient is scheduled for CPX labs, please order future labs, Thanks , Camelia Eng

## 2016-06-13 ENCOUNTER — Other Ambulatory Visit (INDEPENDENT_AMBULATORY_CARE_PROVIDER_SITE_OTHER): Payer: Medicare HMO

## 2016-06-13 DIAGNOSIS — E782 Mixed hyperlipidemia: Secondary | ICD-10-CM

## 2016-06-13 DIAGNOSIS — R7303 Prediabetes: Secondary | ICD-10-CM | POA: Diagnosis not present

## 2016-06-13 DIAGNOSIS — M810 Age-related osteoporosis without current pathological fracture: Secondary | ICD-10-CM | POA: Diagnosis not present

## 2016-06-13 LAB — LIPID PANEL
Cholesterol: 154 mg/dL (ref 0–200)
HDL: 60.5 mg/dL (ref >39.00)
LDL Cholesterol: 78 mg/dL (ref 0–99)
NONHDL: 93.47
TRIGLYCERIDES: 75 mg/dL (ref 0.0–149.0)
Total CHOL/HDL Ratio: 3
VLDL: 15 mg/dL (ref 0.0–40.0)

## 2016-06-13 LAB — VITAMIN D 25 HYDROXY (VIT D DEFICIENCY, FRACTURES): VITD: 62.34 ng/mL (ref 30.00–100.00)

## 2016-06-13 LAB — COMPREHENSIVE METABOLIC PANEL
ALK PHOS: 32 U/L — AB (ref 39–117)
ALT: 18 U/L (ref 0–35)
AST: 19 U/L (ref 0–37)
Albumin: 4.2 g/dL (ref 3.5–5.2)
BILIRUBIN TOTAL: 0.4 mg/dL (ref 0.2–1.2)
BUN: 16 mg/dL (ref 6–23)
CALCIUM: 10 mg/dL (ref 8.4–10.5)
CO2: 28 mEq/L (ref 19–32)
Chloride: 105 mEq/L (ref 96–112)
Creatinine, Ser: 0.82 mg/dL (ref 0.40–1.20)
GFR: 73.39 mL/min (ref >60.00)
GLUCOSE: 112 mg/dL — AB (ref 70–99)
POTASSIUM: 3.7 meq/L (ref 3.5–5.1)
Sodium: 139 mEq/L (ref 135–145)
TOTAL PROTEIN: 7.1 g/dL (ref 6.0–8.3)

## 2016-06-13 LAB — HEMOGLOBIN A1C: Hgb A1c MFr Bld: 5.6 % (ref 4.6–6.5)

## 2016-06-19 ENCOUNTER — Ambulatory Visit (INDEPENDENT_AMBULATORY_CARE_PROVIDER_SITE_OTHER): Payer: Medicare HMO | Admitting: Family Medicine

## 2016-06-19 ENCOUNTER — Encounter: Payer: Self-pay | Admitting: Family Medicine

## 2016-06-19 VITALS — BP 110/72 | HR 78 | Temp 97.6°F | Ht 65.25 in | Wt 124.0 lb

## 2016-06-19 DIAGNOSIS — M810 Age-related osteoporosis without current pathological fracture: Secondary | ICD-10-CM

## 2016-06-19 DIAGNOSIS — R7303 Prediabetes: Secondary | ICD-10-CM | POA: Diagnosis not present

## 2016-06-19 DIAGNOSIS — E782 Mixed hyperlipidemia: Secondary | ICD-10-CM

## 2016-06-19 DIAGNOSIS — Z0000 Encounter for general adult medical examination without abnormal findings: Secondary | ICD-10-CM

## 2016-06-19 NOTE — Assessment & Plan Note (Signed)
Due for prolia.. Repeat BMD at end of 2018.

## 2016-06-19 NOTE — Progress Notes (Signed)
Subjective:    Patient ID: Yesenia Jones, female    DOB: 1945/11/19, 71 y.o.   MRN: [id number]  HPI  I have personally reviewed the Medicare Annual Wellness questionnaire and have noted 1. The patient's medical and social history 2. Their use of alcohol, tobacco or illicit drugs 3. Their current medications and supplements 4. The patient's functional ability including ADL's, fall risks, home safety risks and hearing or visual             impairment. 5. Diet and physical activities 6. Evidence for depression or mood disorders 7.         Updated provider list Cognitive evaluation was performed and recorded on pt medicare questionnaire form. The patients weight, height, BMI and visual acuity have been recorded in the chart  I have made referrals, counseling and provided education to the patient based review of the above and I have provided the pt with a written personalized care plan for preventive services.    No specific concerns.   Her father passed away in January 02, 2016.. Age 40.  Was on hospice.   vit d in nml range on supplement.  Prediabetes:  Stable control. Doing unsweet tea. Lab Results  Component Value Date   HGBA1C 5.6 06/13/2016   Wt Readings from Last 3 Encounters:  06/19/16 124 lb (56.2 kg)  05/29/16 124 lb (56.2 kg)  05/02/16 124 lb (56.2 kg)  Body mass index is 20.48 kg/m.   Elevated Cholesterol: On lipitor 20 mg daily Good control. Using medications without problems: none Muscle aches: none Diet compliance: Good Exercise: 5 days a week. Other complaints: Had one accidental fall. Fractured  Right  Fibula. Followe By Dr. Darrick Penna  Social History /Family History/Past Medical History reviewed and updated if needed.   Review of Systems  Constitutional: Negative for fatigue and fever.  HENT: Negative for congestion.   Eyes: Negative for pain.  Respiratory: Negative for cough and shortness of breath.   Cardiovascular: Negative for chest pain, palpitations  and leg swelling.  Gastrointestinal: Negative for abdominal pain.  Genitourinary: Negative for dysuria and vaginal bleeding.  Musculoskeletal: Positive for back pain.  Neurological: Negative for syncope, light-headedness and headaches.  Psychiatric/Behavioral: Negative for dysphoric mood.       Objective:   Physical Exam  Constitutional: Vital signs are normal. She appears well-developed and well-nourished. She is cooperative.  Non-toxic appearance. She does not appear ill. No distress.  HENT:  Head: Normocephalic.  Right Ear: Hearing, tympanic membrane, external ear and ear canal normal.  Left Ear: Hearing, tympanic membrane, external ear and ear canal normal.  Nose: Nose normal.  Eyes: Conjunctivae, EOM and lids are normal. Pupils are equal, round, and reactive to light. Lids are everted and swept, no foreign bodies found.  Neck: Trachea normal and normal range of motion. Neck supple. Carotid bruit is not present. No thyroid mass and no thyromegaly present.  Cardiovascular: Normal rate, regular rhythm, S1 normal, S2 normal, normal heart sounds and intact distal pulses.  Exam reveals no gallop.   No murmur heard. Pulmonary/Chest: Effort normal and breath sounds normal. No respiratory distress. She has no wheezes. She has no rhonchi. She has no rales.  Abdominal: Soft. Normal appearance and bowel sounds are normal. She exhibits no distension, no fluid wave, no abdominal bruit and no mass. There is no hepatosplenomegaly. There is no tenderness. There is no rebound, no guarding and no CVA tenderness. No hernia.  Lymphadenopathy:    She has no cervical adenopathy.  She has no axillary adenopathy.  Neurological: She is alert. She has normal strength. No cranial nerve deficit or sensory deficit.  Skin: Skin is warm, dry and intact. No rash noted.  Psychiatric: Her speech is normal and behavior is normal. Judgment normal. Her mood appears not anxious. Cognition and memory are normal. She does  not exhibit a depressed mood.          Assessment & Plan:  The patient's preventative maintenance and recommended screening tests for an annual wellness exam were reviewed in full today. Brought up to date unless services declined.  Counselled on the importance of diet, exercise, and its role in overall health and mortality. The patient's FH and SH was reviewed, including their home life, tobacco status, and drug and alcohol status.   Vaccines:  Up to date   Colon: 04/2010 no polyps.. Repeat in 10 years. Dr. Juanda Chance.  Mammo:Last 12/2017nml. DXA: osteopenia 02/2009 on fosamax (on for 5-6 years, never diagnosed with osteoporosis)... Stopped in May 2013 when she ran out.Marland Kitchen DEXA 04/22/2013: osteoporosis, started back on fosamax. Recheck in 04/2015: worsened.. Started on  prolia 05/2015 No issues with that. PAP/DVE: Previously done at [facility], but they retired. Last PAP done nml 2011, all previous were normal 3 in a row,has history of abnormal pap and crypothrapy 20 years ago, No further paps needed, plan every few year DVE. Asymptomatic.  Hep C:neg

## 2016-06-19 NOTE — Assessment & Plan Note (Signed)
Stable control, working on low carb diet. Very active.

## 2016-06-19 NOTE — Assessment & Plan Note (Signed)
Good control on lipitor 20 mg daily Encouraged exercise, weight loss, healthy eating habits.

## 2016-06-19 NOTE — Progress Notes (Signed)
Pre visit review using our clinic review tool, if applicable. No additional management support is needed unless otherwise documented below in the visit note. 

## 2016-06-20 DIAGNOSIS — H2513 Age-related nuclear cataract, bilateral: Secondary | ICD-10-CM | POA: Diagnosis not present

## 2016-06-21 ENCOUNTER — Telehealth: Payer: Self-pay | Admitting: *Deleted

## 2016-06-21 NOTE — Telephone Encounter (Signed)
Information submitted for pts prolia injection. Awaiting response for benefits coverage

## 2016-07-02 ENCOUNTER — Other Ambulatory Visit: Payer: Self-pay | Admitting: Family Medicine

## 2016-07-02 ENCOUNTER — Encounter: Payer: Self-pay | Admitting: Family Medicine

## 2016-07-13 ENCOUNTER — Encounter: Payer: Self-pay | Admitting: Family Medicine

## 2016-07-25 ENCOUNTER — Encounter: Payer: Self-pay | Admitting: Family Medicine

## 2016-08-07 ENCOUNTER — Telehealth: Payer: Self-pay | Admitting: Family Medicine

## 2016-08-07 NOTE — Telephone Encounter (Signed)
[employer] medic[employer] called - pt needs to have prolia pre-certed.  Please call [phone], ask for any one in pre-cert depatment  Thanks

## 2016-08-13 ENCOUNTER — Encounter: Payer: Self-pay | Admitting: Family Medicine

## 2016-08-14 NOTE — Telephone Encounter (Signed)
Spoke in detail with pt. Answered her concerns and addressed her frustration with the length of time it has taken for her to have prolia set up. Compelted  Forms for prolia and they are ready to send back in ASAP.   Placed on Waynetta's desk for ASAP fax to [place].

## 2016-08-14 NOTE — Telephone Encounter (Signed)
See additional mychart messages. °

## 2016-08-15 ENCOUNTER — Other Ambulatory Visit (INDEPENDENT_AMBULATORY_CARE_PROVIDER_SITE_OTHER): Payer: Medicare HMO

## 2016-08-15 DIAGNOSIS — M810 Age-related osteoporosis without current pathological fracture: Secondary | ICD-10-CM | POA: Diagnosis not present

## 2016-08-15 LAB — CALCIUM: CALCIUM: 10.5 mg/dL (ref 8.4–10.5)

## 2016-08-15 NOTE — Telephone Encounter (Signed)
Fax received indicating pt is approved for prolia; valid thru 08/15/2017. spke to pt and advised. Ca lab and injection scheduled for 2 prolia Tx.

## 2016-08-15 NOTE — Telephone Encounter (Signed)
The patient filed a complaint with the office of pt exp.  I called the patient in follow up to her complaint and explained that I reviewed her chart and spoke to waynetta who handles our prolia.  I explained that from what I could see the process was handled appropriately but that it is very time consuming and that her insurance requires a prior auth and there is a pre cert needed by prolia.  I apologized it has taken so long but that the last time she had prolia was in 2017 and her insurance probably changed in 2018 and that is why this process is taking so long.  I also explained that because her insurance changed she may now have an out of pocket and we are trying to take steps to ensure she has the least amount of out of pocket expense as possible.  She said that she has never paid anything in the past and I replied that she may now have to pay something and just wanted her to be prepared.  I assured her that she is welcome to call or email [place] anytime if she has questions or wants to check the status of her prolia.  I told her to ask specifically for [facility].   She remained dissatisfied with the length of time it takes to process prolia.

## 2016-08-15 NOTE — Telephone Encounter (Signed)
Form faxed back to [facility]. Requested expedited response

## 2016-08-22 ENCOUNTER — Ambulatory Visit (INDEPENDENT_AMBULATORY_CARE_PROVIDER_SITE_OTHER): Payer: Medicare HMO | Admitting: *Deleted

## 2016-08-22 DIAGNOSIS — M810 Age-related osteoporosis without current pathological fracture: Secondary | ICD-10-CM

## 2016-08-22 MED ORDER — DENOSUMAB 60 MG/ML ~~LOC~~ SOLN
60.0000 mg | SUBCUTANEOUS | Status: DC
  Administered 2016-08-22: 60 mg via SUBCUTANEOUS

## 2016-12-25 ENCOUNTER — Ambulatory Visit (INDEPENDENT_AMBULATORY_CARE_PROVIDER_SITE_OTHER): Payer: Medicare HMO | Admitting: Family Medicine

## 2016-12-25 ENCOUNTER — Ambulatory Visit
Admission: RE | Admit: 2016-12-25 | Discharge: 2016-12-25 | Disposition: A | Discharge status: Home / Self Care | Payer: Medicare HMO | Source: Ambulatory Visit | Attending: Family Medicine | Admitting: Family Medicine

## 2016-12-25 VITALS — BP 126/82 | Ht 65.0 in | Wt 123.0 lb

## 2016-12-25 DIAGNOSIS — S99922A Unspecified injury of left foot, initial encounter: Secondary | ICD-10-CM | POA: Diagnosis not present

## 2016-12-25 DIAGNOSIS — M25572 Pain in left ankle and joints of left foot: Secondary | ICD-10-CM | POA: Diagnosis not present

## 2016-12-25 DIAGNOSIS — M79672 Pain in left foot: Secondary | ICD-10-CM

## 2016-12-25 DIAGNOSIS — M7989 Other specified soft tissue disorders: Secondary | ICD-10-CM | POA: Diagnosis not present

## 2016-12-25 DIAGNOSIS — S99912A Unspecified injury of left ankle, initial encounter: Secondary | ICD-10-CM | POA: Diagnosis not present

## 2016-12-27 ENCOUNTER — Encounter: Payer: Self-pay | Admitting: Family Medicine

## 2016-12-28 ENCOUNTER — Ambulatory Visit (INDEPENDENT_AMBULATORY_CARE_PROVIDER_SITE_OTHER): Payer: Medicare HMO | Admitting: Family Medicine

## 2016-12-28 VITALS — BP 132/78 | Ht 65.0 in | Wt 123.0 lb

## 2016-12-28 DIAGNOSIS — S93492D Sprain of other ligament of left ankle, subsequent encounter: Secondary | ICD-10-CM

## 2016-12-28 NOTE — Progress Notes (Signed)
    CHIEF COMPLAINT / HPI:  Date of injury 12/25/2016 Patient stepped off a curb landing awkwardly on her left foot. Had immediate pain. Swelling started pretty soon thereafter. She can barely bear weight. She is also starting to have some bruising. Has prior history of ankle fracture on the other side but no history of fracture or surgical intervention on this side.  REVIEW OF SYSTEMS:  Swelling, ecchymoses, pain as per history of present illness. No unusual fever. No calf pain. No numbness or tingling in her left lower extremity. Other than the bruising, no unusual erythema or warmth of the left ankle or foot. PERTINENT  PMH / PSH: I have reviewed the patient's medications, allergies, past medical and surgical history, smoking status.  Pertinent findings that relate to today's visit / issues include: History of right ankle fracture, prediabetes, history of osteoporosis.  OBJECTIVE:  Vital signs are reviewed.   GEN.: Well-developed female no acute distress GAIT: Antalgic secondary to limping on the left foot ANKLE: Left ankle reveals some ecchymoses and mild swelling. She also has some ecchymoses over the dorsal portion of her left foot. He's tender to palpation over the base of the fifth metatarsal and over the area of the ATF. ANKLE: Anterior drawer is intact. Unable to fully test eversion and inversion secondary to pain. Dorsiflexion plantar flexion are intact 5 out of 5 compared with the other side. NEURO: Intact sensation to soft touch bilateral feet VASCULAR: Dorsalis pedis and posterior tibialis pulses are 1-2+ bilaterally equal. ULTRASOUND: ATF appears to be intact although there is some echogenicity within the proximal portion suggesting partial tear. There is no fluid noted in the ankle joint. The fifth metatarsal appears to be intact with no observed cortical break. Medial and lateral malleolus appear to be normal.  ASSESSMENT / PLAN: Left ankle and foot pain. I don't see  anything on ultrasound suggestive fracture but she does have point tenderness over the fifth metatarsal so we'll get a series of x-rays including the ankle. Weightbearing as tolerated. Cam Walker boot. Elevation and ice. Follow-up either later this week or early next week. I will call her if he x-rays reveal fracture. We discussed pain management and she wants use Tylenol.

## 2016-12-28 NOTE — Progress Notes (Signed)
    CHIEF COMPLAINT / HPI:  Follow-up left ankle foot pain. She's having a lot of swelling. Pain is better but still pretty consistent. She's not been able to walk as much iron as she would like but admits that it is much better than when I saw her a in the week.  REVIEW OF SYSTEMS:  Continued swelling, increased bruising. No calf pain. No redness or streaking of the left lower leg. No fever.  OBJECTIVE:  Vital signs are reviewed.   GEN.: Well-developed female no acute distress ANKLE: Left. Diffuse swelling. She has mild tenderness to palpation over the anterior talofibular ligament area. I did not stress the ligament today. Essentially nontender over medial or lateral malleolus. FOOT: Diffusely tender but there is no point tenderness over the fifth metatarsal area. CALF: Soft, no mass. Negative Homans sign SKIN: Diffuse ecchymoses over the left ankle and foot.  ASSESSMENT / PLAN: Ankle sprain. ATF damage per ultrasound in mechanism. When HER LAST WEEK IT DID LOOK LIKE THE ATF WAS STILL INTACT ALTHOUGH THERE WAS SOME OBVIOUS TEARING. X-RAYS LOOK FINE, THERE IS NO SIGN OF FIFTH METATARSAL FRACTURE, NO SIGN OF AVULSION FRACTURE IN THE ANKLE, MORENCY LOOKS GREAT. SEE PATIENT INSTRUCTIONS. WE'LL SEE HER BACK WILL START REHABILITATION.

## 2016-12-28 NOTE — Patient Instructions (Signed)
The x-rays looked okay as we discussed. I recommend you continue to wear the fracture walker boot when you're up doing a lot. I would try and  limit the amount of time that you have to do a lot of standing or walking and when you are seated elevate the ankle to at least the level of the hip. We are getting you a compression sleeve.  Try using a warm water soak followed by cold water soak, each portion lasting 10-15 minutes. Do this once or twice a day, whatever you have time for. I would do that over the next 5-7 days to see if we can decrease some of the swelling.  When I see you back, we'll start in a rehabilitation program to strengthen the ankle back up. If anything changes or gets worse, please give [place] a call. Great to see you!

## 2017-01-06 ENCOUNTER — Encounter: Payer: Self-pay | Admitting: Sports Medicine

## 2017-01-07 MED ORDER — GABAPENTIN 300 MG PO CAPS: 600.0000 mg | ORAL_CAPSULE | Freq: Three times a day (TID) | ORAL | 3 refills | Status: DC

## 2017-01-15 ENCOUNTER — Ambulatory Visit (INDEPENDENT_AMBULATORY_CARE_PROVIDER_SITE_OTHER): Payer: Medicare HMO | Admitting: Family Medicine

## 2017-01-15 VITALS — BP 118/80 | Ht 65.0 in | Wt 124.0 lb

## 2017-01-15 DIAGNOSIS — S93402D Sprain of unspecified ligament of left ankle, subsequent encounter: Secondary | ICD-10-CM | POA: Diagnosis not present

## 2017-01-16 ENCOUNTER — Encounter: Payer: Self-pay | Admitting: Family Medicine

## 2017-01-16 NOTE — Progress Notes (Signed)
    CHIEF COMPLAINT / HPI:   f/u left ankle sprain. Much less swelling. Still some pain but 90% better. Has been walking and has some increase in swelling if she walks a lot but wearing compression sleeve and elevating it when she can. Wants to go back to gym. Still some stiffness---feels similar to right ankle / foot which was never totally back to baseline after she injured it previously.   REVIEW OF SYSTEMS:  No calf pain, no LE numbness (other than baseline neuropathy) no fever.  OBJECTIVE:  Vital signs are reviewed.  GEN WD WN NAD Left ankle still has some slight resolving ecchymoses but swelling is virtually gone. No TTP over malleoli or over (previously tender) 5th MT. Ankle has good enpoint, normal inversion and eversion ROM but still some weakness and pain with resisted motion eversion and inversion.  ASSESSMENT / PLAN: Ankle sprain resolving nicely. HEP rehab given and explained in detail. Therabands blue and yellow provided. She is contemplating a trip to [place] so I will let her f/u PRN. Continue compression sleeve and elevation as possible. Call or come back with problems. Recommend HEP for both ankles.

## 2017-01-17 ENCOUNTER — Encounter: Payer: Self-pay | Admitting: Family Medicine

## 2017-01-17 DIAGNOSIS — M810 Age-related osteoporosis without current pathological fracture: Secondary | ICD-10-CM

## 2017-01-17 DIAGNOSIS — Z1231 Encounter for screening mammogram for malignant neoplasm of breast: Secondary | ICD-10-CM

## 2017-02-13 ENCOUNTER — Encounter: Payer: Self-pay | Admitting: Family Medicine

## 2017-02-15 ENCOUNTER — Telehealth: Payer: Self-pay | Admitting: *Deleted

## 2017-02-15 NOTE — Telephone Encounter (Signed)
Verification of benefits have been processed and an approval has been received for pts prolia injection. Pts estimated cost are appx $220. This is only an estimate and cannot be confirmed until benefits are paid. Please advise pt and schedule if needed. If scheduled, once the injection is received, pls contact me back with the date it was received so that I am able to update prolia folder. thanks   Benefits were not re-verified as pts insurance company has not changed and PA has been approved through 08/15/17. Prolia injection and Ca lab previously scheduled.

## 2017-02-22 ENCOUNTER — Telehealth: Payer: Self-pay | Admitting: Family Medicine

## 2017-02-22 ENCOUNTER — Other Ambulatory Visit (INDEPENDENT_AMBULATORY_CARE_PROVIDER_SITE_OTHER): Payer: Medicare HMO

## 2017-02-22 DIAGNOSIS — M810 Age-related osteoporosis without current pathological fracture: Secondary | ICD-10-CM

## 2017-02-22 DIAGNOSIS — R7303 Prediabetes: Secondary | ICD-10-CM

## 2017-02-22 DIAGNOSIS — E782 Mixed hyperlipidemia: Secondary | ICD-10-CM

## 2017-02-22 LAB — COMPREHENSIVE METABOLIC PANEL
ALK PHOS: 31 U/L — AB (ref 39–117)
ALT: 14 U/L (ref 0–35)
AST: 17 U/L (ref 0–37)
Albumin: 3.7 g/dL (ref 3.5–5.2)
BILIRUBIN TOTAL: 0.4 mg/dL (ref 0.2–1.2)
BUN: 15 mg/dL (ref 6–23)
CO2: 29 mEq/L (ref 19–32)
CREATININE: 0.89 mg/dL (ref 0.40–1.20)
Calcium: 9.6 mg/dL (ref 8.4–10.5)
Chloride: 106 mEq/L (ref 96–112)
GFR: 66.64 mL/min (ref >60.00)
GLUCOSE: 100 mg/dL — AB (ref 70–99)
Potassium: 4.4 mEq/L (ref 3.5–5.1)
Sodium: 141 mEq/L (ref 135–145)
TOTAL PROTEIN: 6.4 g/dL (ref 6.0–8.3)

## 2017-02-22 LAB — LIPID PANEL
Cholesterol: 133 mg/dL (ref 0–200)
HDL: 57.6 mg/dL (ref >39.00)
LDL Cholesterol: 61 mg/dL (ref 0–99)
NONHDL: 75.53
TRIGLYCERIDES: 73 mg/dL (ref 0.0–149.0)
Total CHOL/HDL Ratio: 2
VLDL: 14.6 mg/dL (ref 0.0–40.0)

## 2017-02-22 LAB — VITAMIN D 25 HYDROXY (VIT D DEFICIENCY, FRACTURES): VITD: 69.13 ng/mL (ref 30.00–100.00)

## 2017-02-22 NOTE — Telephone Encounter (Signed)
-----   Message from Greig Right sent at 02/22/2017  9:02 AM EDT ----- Regarding: Lab orders  Please enter lab orders for Yesenia Jones.  Thank you :)   Fredric Mare

## 2017-02-26 ENCOUNTER — Ambulatory Visit (INDEPENDENT_AMBULATORY_CARE_PROVIDER_SITE_OTHER): Payer: Medicare HMO

## 2017-02-26 DIAGNOSIS — M810 Age-related osteoporosis without current pathological fracture: Secondary | ICD-10-CM | POA: Diagnosis not present

## 2017-02-26 MED ORDER — DENOSUMAB 60 MG/ML ~~LOC~~ SOLN
60.0000 mg | Freq: Once | SUBCUTANEOUS | Status: AC
  Administered 2017-02-26: 60 mg via SUBCUTANEOUS

## 2017-02-26 NOTE — Telephone Encounter (Signed)
Yesenia Jones an FYI pt was in today for her prolia injection.

## 2017-05-01 ENCOUNTER — Ambulatory Visit
Admission: RE | Admit: 2017-05-01 | Discharge: 2017-05-01 | Disposition: A | Discharge status: Home / Self Care | Payer: Medicare HMO | Source: Ambulatory Visit | Attending: Family Medicine | Admitting: Family Medicine

## 2017-05-01 DIAGNOSIS — Z780 Asymptomatic menopausal state: Secondary | ICD-10-CM | POA: Diagnosis not present

## 2017-05-01 DIAGNOSIS — M810 Age-related osteoporosis without current pathological fracture: Secondary | ICD-10-CM

## 2017-05-01 DIAGNOSIS — Z1231 Encounter for screening mammogram for malignant neoplasm of breast: Secondary | ICD-10-CM | POA: Diagnosis not present

## 2017-06-19 DIAGNOSIS — R69 Illness, unspecified: Secondary | ICD-10-CM | POA: Diagnosis not present

## 2017-06-20 ENCOUNTER — Telehealth: Payer: Self-pay | Admitting: Family Medicine

## 2017-06-20 DIAGNOSIS — M810 Age-related osteoporosis without current pathological fracture: Secondary | ICD-10-CM

## 2017-06-20 DIAGNOSIS — R7303 Prediabetes: Secondary | ICD-10-CM

## 2017-06-20 DIAGNOSIS — E782 Mixed hyperlipidemia: Secondary | ICD-10-CM

## 2017-06-20 NOTE — Telephone Encounter (Signed)
-----   Message from Robert Bellow, LPN sent at 07/16/1935  3:26 PM EST ----- Regarding: Labs 2/7 Lab orders needed. Thank you.  Insurance:  Community education officer

## 2017-06-21 ENCOUNTER — Ambulatory Visit (INDEPENDENT_AMBULATORY_CARE_PROVIDER_SITE_OTHER): Payer: Medicare HMO

## 2017-06-21 VITALS — BP 100/74 | HR 78 | Temp 97.8°F | Ht 65.5 in | Wt 127.0 lb

## 2017-06-21 DIAGNOSIS — E782 Mixed hyperlipidemia: Secondary | ICD-10-CM

## 2017-06-21 DIAGNOSIS — E282 Polycystic ovarian syndrome: Secondary | ICD-10-CM | POA: Diagnosis not present

## 2017-06-21 DIAGNOSIS — Z23 Encounter for immunization: Secondary | ICD-10-CM

## 2017-06-21 DIAGNOSIS — R7303 Prediabetes: Secondary | ICD-10-CM | POA: Diagnosis not present

## 2017-06-21 DIAGNOSIS — M810 Age-related osteoporosis without current pathological fracture: Secondary | ICD-10-CM

## 2017-06-21 DIAGNOSIS — Z0000 Encounter for general adult medical examination without abnormal findings: Secondary | ICD-10-CM | POA: Diagnosis not present

## 2017-06-21 LAB — COMPREHENSIVE METABOLIC PANEL
ALK PHOS: 30 U/L — AB (ref 39–117)
ALT: 18 U/L (ref 0–35)
AST: 20 U/L (ref 0–37)
Albumin: 3.9 g/dL (ref 3.5–5.2)
BUN: 19 mg/dL (ref 6–23)
CO2: 30 meq/L (ref 19–32)
Calcium: 10.6 mg/dL — ABNORMAL HIGH (ref 8.4–10.5)
Chloride: 103 mEq/L (ref 96–112)
Creatinine, Ser: 0.82 mg/dL (ref 0.40–1.20)
GFR: 73.18 mL/min (ref >60.00)
GLUCOSE: 94 mg/dL (ref 70–99)
POTASSIUM: 4.2 meq/L (ref 3.5–5.1)
SODIUM: 138 meq/L (ref 135–145)
TOTAL PROTEIN: 6.9 g/dL (ref 6.0–8.3)
Total Bilirubin: 0.5 mg/dL (ref 0.2–1.2)

## 2017-06-21 LAB — CBC WITH DIFFERENTIAL/PLATELET
BASOS PCT: 0.7 %
Basophils Absolute: 39 cells/uL (ref 0–200)
EOS ABS: 101 {cells}/uL (ref 15–500)
Eosinophils Relative: 1.8 %
HCT: 40.3 % (ref 35.0–45.0)
HEMOGLOBIN: 13.7 g/dL (ref 11.7–15.5)
Lymphs Abs: 1674 cells/uL (ref 850–3900)
MCH: 30.6 pg (ref 27.0–33.0)
MCHC: 34 g/dL (ref 32.0–36.0)
MCV: 90.2 fL (ref 80.0–100.0)
MPV: 10.6 fL (ref 7.5–12.5)
Monocytes Relative: 7.9 %
NEUTROS ABS: 3343 {cells}/uL (ref 1500–7800)
Neutrophils Relative %: 59.7 %
PLATELETS: 262 10*3/uL (ref 140–400)
RBC: 4.47 10*6/uL (ref 3.80–5.10)
RDW: 12.8 % (ref 11.0–15.0)
TOTAL LYMPHOCYTE: 29.9 %
WBC: 5.6 10*3/uL (ref 3.8–10.8)
WBCMIX: 442 {cells}/uL (ref 200–950)

## 2017-06-21 LAB — LIPID PANEL
CHOL/HDL RATIO: 2
Cholesterol: 141 mg/dL (ref 0–200)
HDL: 57.9 mg/dL (ref >39.00)
LDL Cholesterol: 71 mg/dL (ref 0–99)
NONHDL: 83.12
Triglycerides: 59 mg/dL (ref 0.0–149.0)
VLDL: 11.8 mg/dL (ref 0.0–40.0)

## 2017-06-21 LAB — VITAMIN D 25 HYDROXY (VIT D DEFICIENCY, FRACTURES): VITD: 61.02 ng/mL (ref 30.00–100.00)

## 2017-06-21 LAB — HEMOGLOBIN A1C: HEMOGLOBIN A1C: 5.6 % (ref 4.6–6.5)

## 2017-06-21 NOTE — Patient Instructions (Addendum)
Ms. Rabold , Thank you for taking time to come for your Medicare Wellness Visit. I appreciate your ongoing commitment to your health goals. Please review the following plan we discussed and let me know if I can assist you in the future.   These are the goals we discussed: Goals    . Increase physical activity     Starting 06/21/2017, I will continue to exercise for at least 60 minutes 5 days per week.        This is a list of the screening recommended for you and due dates:  Health Maintenance  Topic Date Due  . Mammogram  05/01/2018  . Colon Cancer Screening  05/11/2020  . Tetanus Vaccine  06/22/2027  . Flu Shot  Completed  . DEXA scan (bone density measurement)  Completed  .  Hepatitis C: One time screening is recommended by Center for Disease Control  (CDC) for  adults born from [birth year] through 1964.   Completed  . Pneumonia vaccines  Completed    Ms. Sassone , Thank you for taking time to come for your Medicare Wellness Visit. I appreciate your ongoing commitment to your health goals. Please review the following plan we discussed and let me know if I can assist you in the future.   These are the goals we discussed: Goals    . Increase physical activity     Starting 06/21/2017, I will continue to exercise for at least 60 minutes 5 days per week.        This is a list of the screening recommended for you and due dates:  Health Maintenance  Topic Date Due  . Mammogram  05/01/2018  . Colon Cancer Screening  05/11/2020  . Tetanus Vaccine  06/22/2027  . Flu Shot  Completed  . DEXA scan (bone density measurement)  Completed  .  Hepatitis C: One time screening is recommended by Center for Disease Control  (CDC) for  adults born from [birth year] through 1964.   Completed  . Pneumonia vaccines  Completed   Preventive Care for Adults  A healthy lifestyle and preventive care can promote health and wellness. Preventive health guidelines for adults include the following key practices.  . A  routine yearly physical is a good way to check with your health care provider about your health and preventive screening. It is a chance to share any concerns and updates on your health and to receive a thorough exam.  . Visit your dentist for a routine exam and preventive care every 6 months. Brush your teeth twice a day and floss once a day. Good oral hygiene prevents tooth decay and gum disease.  . The frequency of eye exams is based on your age, health, family medical history, use  of contact lenses, and other factors. Follow your health care provider's recommendations for frequency of eye exams.  . Eat a healthy diet. Foods like vegetables, fruits, whole grains, low-fat dairy products, and lean protein foods contain the nutrients you need without too many calories. Decrease your intake of foods high in solid fats, added sugars, and salt. Eat the right amount of calories for you. Get information about a proper diet from your health care provider, if necessary.  . Regular physical exercise is one of the most important things you can do for your health. Most adults should get at least 150 minutes of moderate-intensity exercise (any activity that increases your heart rate and causes you to sweat) each week. In addition, most adults need  muscle-strengthening exercises on 2 or more days a week.  Silver Sneakers may be a benefit available to you. To determine eligibility, you may visit the website: www.silversneakers.com or contact program at 740-353-7664 Mon-Fri between 8AM-8PM.   . Maintain a healthy weight. The body mass index (BMI) is a screening tool to identify possible weight problems. It provides an estimate of body fat based on height and weight. Your health care provider can find your BMI and can help you achieve or maintain a healthy weight.   For adults 20 years and older: ? A BMI below 18.5 is considered underweight. ? A BMI of 18.5 to 24.9 is normal. ? A BMI of 25 to 29.9 is  considered overweight. ? A BMI of 30 and above is considered obese.   . Maintain normal blood lipids and cholesterol levels by exercising and minimizing your intake of saturated fat. Eat a balanced diet with plenty of fruit and vegetables. Blood tests for lipids and cholesterol should begin at age 80 and be repeated every 5 years. If your lipid or cholesterol levels are high, you are over 50, or you are at high risk for heart disease, you may need your cholesterol levels checked more frequently. Ongoing high lipid and cholesterol levels should be treated with medicines if diet and exercise are not working.  . If you smoke, find out from your health care provider how to quit. If you do not use tobacco, please do not start.  . If you choose to drink alcohol, please do not consume more than 2 drinks per day. One drink is considered to be 12 ounces (355 mL) of beer, 5 ounces (148 mL) of wine, or 1.5 ounces (44 mL) of liquor.  . If you are 28-50 years old, ask your health care provider if you should take aspirin to prevent strokes.  . Use sunscreen. Apply sunscreen liberally and repeatedly throughout the day. You should seek shade when your shadow is shorter than you. Protect yourself by wearing long sleeves, pants, a wide-brimmed hat, and sunglasses year round, whenever you are outdoors.  . Once a month, do a whole body skin exam, using a mirror to look at the skin on your back. Tell your health care provider of new moles, moles that have irregular borders, moles that are larger than a pencil eraser, or moles that have changed in shape or color.

## 2017-06-21 NOTE — Progress Notes (Signed)
Subjective:   Yesenia Jones is a 72 y.o. female who presents for Medicare Annual (Subsequent) preventive examination.  Review of Systems:  N/A Cardiac Risk Factors include: advanced age (>92men, >46 women);dyslipidemia     Objective:     Vitals: BP 100/74 (BP Location: Right Arm, Patient Position: Sitting, Cuff Size: Normal)   Pulse 78   Temp 97.8 F (36.6 C) (Oral)   Ht 5' 5.5" (1.664 m) Comment: no shoes  Wt 127 lb (57.6 kg)   SpO2 95%   BMI 20.81 kg/m   Body mass index is 20.81 kg/m.  Advanced Directives 06/21/2017 12/25/2016 04/03/2016 09/22/2014  Does Patient Have a Medical Advance Directive? No No No No  Would patient like information on creating a medical advance directive? No - Patient declined No - Patient declined No - Patient declined No - patient declined information    Tobacco Social History   Tobacco Use  Smoking Status Never Smoker  Smokeless Tobacco Never Used     Counseling given: No   Clinical Intake:  Pre-visit preparation completed: Yes  Pain : 0-10 Pain Score: 3  Pain Type: Chronic pain Pain Location: Knee Pain Orientation: Right     Nutritional Status: BMI of 19-24  Normal Nutritional Risks: None Diabetes: No  How often do you need to have someone help you when you read instructions, pamphlets, or other written materials from your doctor or pharmacy?: 1 - Never What is the last grade level you completed in school?: 12th grade + 1 yr college  Interpreter Needed?: No  Comments: pt lives with spouse Information entered by :: LPinson, LPN  Past Medical History:  Diagnosis Date  . Hyperlipidemia    Past Surgical History:  Procedure Laterality Date  . CHOLECYSTECTOMY    . KNEE SURGERY     Family History  Problem Relation Age of Onset  . Stroke Mother   . Hyperlipidemia Mother   . Hyperlipidemia Father   . Cancer Maternal Aunt        breast cancer   Social History   Socioeconomic History  . Marital status: Married   Spouse name: None  . Number of children: 2  . Years of education: None  . Highest education level: None  Social Needs  . Financial resource strain: None  . Food insecurity - worry: None  . Food insecurity - inability: None  . Transportation needs - medical: None  . Transportation needs - non-medical: None  Occupational History  . Occupation: retired for [employer]: RETIRED  Tobacco Use  . Smoking status: Never Smoker  . Smokeless tobacco: Never Used  Substance and Sexual Activity  . Alcohol use: Yes    Alcohol/week: 1.8 oz    Types: 3 Glasses of wine per week  . Drug use: No  . Sexual activity: None  Other Topics Concern  . None  Social History Narrative   Regular exercise-yes   Diet: fruits and veggies,water    Outpatient Encounter Medications as of 06/21/2017  Medication Sig  . aspirin 81 MG tablet Take 81 mg by mouth daily.  Marland Kitchen atorvastatin (LIPITOR) 20 MG tablet TAKE 1 TABLET BY MOUTH DAILY  . calcium gluconate 650 MG tablet Take 650 mg by mouth daily.  . Cholecalciferol (VITAMIN D-3) 1000 UNITS CAPS Take 1 capsule by mouth daily.  Marland Kitchen Dextromethorphan-Guaifenesin (MUCINEX DM PO) Take 1 tablet by mouth daily as needed.  . gabapentin (NEURONTIN) 300 MG capsule Take 2 capsules (600 mg total) by  mouth 3 (three) times daily. .  . GLUCOSAMINE-CHONDROITIN PO Take 2 tablets by mouth daily.  . Multiple Vitamins-Minerals (CENTRUM SILVER ADULT 50+ PO) Take 1 tablet by mouth daily.  Marland Kitchen pyridOXINE (VITAMIN B-6) 100 MG tablet Take 100 mg by mouth daily.   Facility-Administered Encounter Medications as of 06/21/2017  Medication  . denosumab (PROLIA) injection 60 mg    Activities of Daily Living In your present state of health, do you have any difficulty performing the following activities: 06/21/2017  Hearing? Y  Comment bilateral hearing aids  Vision? N  Difficulty concentrating or making decisions? N  Walking or climbing stairs? N  Dressing or bathing? N  Doing errands,  shopping? N  Preparing Food and eating ? N  Using the Toilet? N  In the past six months, have you accidently leaked urine? Y  Comment dribbling of urine; wears panty liners  Do you have problems with loss of bowel control? N  Managing your Medications? N  Managing your Finances? N  Housekeeping or managing your Housekeeping? N  Some recent data might be hidden    Patient Care Team: Excell Seltzer, MD as PCP - General (Family Medicine)    Assessment:   This is a routine wellness examination for Synethia.  Exercise Activities and Dietary recommendations Current Exercise Habits: Home exercise routine, Type of exercise: Other - see comments;strength training/weights(elliptical; stationary bike), Time (Minutes): 60, Frequency (Times/Week): 5, Weekly Exercise (Minutes/Week): 300, Intensity: Moderate  Goals    . Increase physical activity     Starting 06/21/2017, I will continue to exercise for at least 60 minutes 5 days per week.        Fall Risk Fall Risk  06/21/2017 12/25/2016 06/19/2016 05/29/2016 04/03/2016  Falls in the past year? Yes Yes Yes No No  Comment accidental fall after tripping over curb; sprain to left ankle - - - -  Number falls in past yr: 1 1 1  - -  Injury with Fall? Yes Yes Yes - -  Risk for fall due to : - - - Other (Comment) Other (Comment)   Depression Screen PHQ 2/9 Scores 06/21/2017 06/19/2016 05/29/2016 04/03/2016  PHQ - 2 Score 0 0 0 0  PHQ- 9 Score 0 - - -  Exception Documentation - - Other- indicate reason in comment box Other- indicate reason in comment box     Cognitive Function MMSE - Mini Mental State Exam 06/21/2017  Orientation to time 5  Orientation to Place 5  Registration 3  Attention/ Calculation 0  Recall 3  Language- name 2 objects 0  Language- repeat 1  Language- follow 3 step command 3  Language- read & follow direction 0  Write a sentence 0  Copy design 0  Total score 20     PLEASE NOTE: A Mini-Cog screen was completed. Maximum score is  20. A value of 0 denotes this part of Folstein MMSE was not completed or the patient failed this part of the Mini-Cog screening.   Mini-Cog Screening Orientation to Time - Max 5 pts Orientation to Place - Max 5 pts Registration - Max 3 pts Recall - Max 3 pts Language Repeat - Max 1 pts Language Follow 3 Step Command - Max 3 pts     Immunization History  Administered Date(s) Administered  . Influenza Split 03/23/2011  . Influenza Whole 03/11/2008, 03/17/2010  . Influenza, High Dose Seasonal PF 03/03/2014  . Influenza,inj,Quad PF,6+ Mos 03/08/2015, 02/27/2016  . Influenza-Unspecified 02/11/2013, 03/01/2017  . Pneumococcal Conjugate-13  05/28/2014, 02/27/2016  . Pneumococcal Polysaccharide-23 03/25/2008, 06/21/2017  . Td 07/21/2007, 06/21/2017  . Zoster 04/12/2008    Screening Tests Health Maintenance  Topic Date Due  . MAMMOGRAM  05/01/2018  . COLONOSCOPY  05/11/2020  . TETANUS/TDAP  06/22/2027  . INFLUENZA VACCINE  Completed  . DEXA SCAN  Completed  . Hepatitis C Screening  Completed  . PNA vac Low Risk Adult  Completed       Plan:     I have personally reviewed, addressed, and noted the following in the patient's chart:  A. Medical and social history B. Use of alcohol, tobacco or illicit drugs  C. Current medications and supplements D. Functional ability and status E.  Nutritional status F.  Physical activity G. Advance directives H. List of other physicians I.  Hospitalizations, surgeries, and ER visits in previous 12 months J.  Vitals K. Screenings to include hearing, vision, cognitive, depression L. Referrals and appointments - none  In addition, I have reviewed and discussed with patient certain preventive protocols, quality metrics, and best practice recommendations. A written personalized care plan for preventive services as well as general preventive health recommendations were provided to patient.  See attached scanned questionnaire for additional  information.   Signed,   Randa Evens, MHA, BS, LPN Health Coach

## 2017-06-21 NOTE — Progress Notes (Signed)
PCP notes:   Health maintenance:  PPSV23 - administered Tetanus vaccine - administered  Abnormal screenings:   Fall risk - hx of single fall Fall Risk  06/21/2017 12/25/2016 06/19/2016 05/29/2016 04/03/2016  Falls in the past year? Yes Yes Yes No No  Comment accidental fall after tripping over curb; sprain to left ankle - - - -  Number falls in past yr: 1 1 1  - -  Injury with Fall? Yes Yes Yes - -  Risk for fall due to : - - - Other (Comment) Other (Comment)   Patient concerns:   None  Nurse concerns:  None  Next PCP appt:   06/28/17 @ 0845

## 2017-06-21 NOTE — Progress Notes (Signed)
Pre visit review using our clinic review tool, if applicable. No additional management support is needed unless otherwise documented below in the visit note. 

## 2017-06-25 NOTE — Progress Notes (Signed)
I reviewed health advisor's note, was available for consultation, and agree with documentation and plan.  

## 2017-06-28 ENCOUNTER — Encounter: Payer: Self-pay | Admitting: Family Medicine

## 2017-06-28 ENCOUNTER — Other Ambulatory Visit: Payer: Self-pay

## 2017-06-28 ENCOUNTER — Ambulatory Visit (INDEPENDENT_AMBULATORY_CARE_PROVIDER_SITE_OTHER): Payer: Medicare HMO | Admitting: Family Medicine

## 2017-06-28 VITALS — BP 104/64 | HR 71 | Temp 98.5°F | Ht 65.5 in | Wt 131.0 lb

## 2017-06-28 DIAGNOSIS — M810 Age-related osteoporosis without current pathological fracture: Secondary | ICD-10-CM

## 2017-06-28 DIAGNOSIS — E782 Mixed hyperlipidemia: Secondary | ICD-10-CM | POA: Diagnosis not present

## 2017-06-28 DIAGNOSIS — Z0000 Encounter for general adult medical examination without abnormal findings: Secondary | ICD-10-CM | POA: Diagnosis not present

## 2017-06-28 DIAGNOSIS — R7303 Prediabetes: Secondary | ICD-10-CM | POA: Diagnosis not present

## 2017-06-28 MED ORDER — DENOSUMAB 60 MG/ML ~~LOC~~ SOLN: 60.0000 mg | SUBCUTANEOUS | Status: AC

## 2017-06-28 MED ORDER — ATORVASTATIN CALCIUM 20 MG PO TABS: 20.0000 mg | ORAL_TABLET | Freq: Every day | ORAL | 3 refills | Status: DC

## 2017-06-28 NOTE — Assessment & Plan Note (Addendum)
No worsening in hip, improved in spine.  Continue prolia.

## 2017-06-28 NOTE — Patient Instructions (Signed)
Stop at front desk to set up   Lab appt for per-prolia labs on 08/23/2017.  Schedule  Prolia nurse visit on 08/28/2017.

## 2017-06-28 NOTE — Progress Notes (Addendum)
Subjective:    Patient ID: Yesenia Jones, female    DOB: 1945-12-02, 72 y.o.   MRN: [id number]  HPI   The patient presents for complete physical and review of chronic health problems. He/She also has the following acute concerns today: NONE   The patient saw Lu Duffel, LPN for medicare wellness. Note reviewed in detail and important notes copied below. Health maintenance:  PPSV23 - administered Tetanus vaccine - administered  Abnormal screenings:   Fall risk - hx of single fall Fall Risk  06/21/2017 12/25/2016 06/19/2016 05/29/2016 04/03/2016  Falls in the past year? Yes Yes Yes No No  Comment accidental fall after tripping over curb; sprain to left ankle - - - -  Number falls in past yr: 1 1 1  - -  Injury with Fall? Yes Yes Yes - -  Risk for fall due to : - - - Other (Comment) Other (Comment)    06/28/17 today: Body mass index is 21.47 kg/m   Wt Readings from Last 3 Encounters:  06/28/17 131 lb (59.4 kg)  06/21/17 127 lb (57.6 kg)  01/15/17 124 lb (56.2 kg)    Elevated Cholesterol:  Good control on lipitor 20 mg daily Lab Results  Component Value Date   CHOL 141 06/21/2017   HDL 57.90 06/21/2017   LDLCALC 71 06/21/2017   LDLDIRECT 149.3 04/06/2013   TRIG 59.0 06/21/2017   CHOLHDL 2 06/21/2017  Using medications without problems: none Muscle aches:  none Diet compliance: good control Exercise: several times a week. Other complaints:  Has been feeling more tired lately.   Prediabetes:  Lab Results  Component Value Date   HGBA1C 5.6 06/21/2017    Osteoporosis:  On prolia.. Last DEXA 04/2017 There has been a statistically significant increase in BMD of Lumbar Spine -1.1,and no change in BMD of Dual Femur hips -2.6 since prior exam dated 04/28/2015.  Calcium is slightly elevated .04/30/2015 She has been taking twice as much as usual.. Will return to previous dose.  Social History /Family History/Past Medical History reviewed in detail and updated in EMR if  needed. Blood pressure 104/64, pulse 71, temperature 98.5 F (36.9 C), temperature source Oral, height 5' 5.5" (1.664 m), weight 131 lb (59.4 kg).  Review of Systems  Constitutional: Negative for fatigue and fever.  HENT: Negative for congestion.   Eyes: Negative for pain.  Respiratory: Negative for cough and shortness of breath.   Cardiovascular: Negative for chest pain, palpitations and leg swelling.  Gastrointestinal: Negative for abdominal pain.  Genitourinary: Negative for dysuria and vaginal bleeding.       She has noted some afer full urination urinary dribbling off and on in last several months, no dysuria, no blood urine.  no urgency.  nm flow  wears liners  no stress incontinence.  Musculoskeletal: Negative for back pain.  Neurological: Negative for syncope, light-headedness and headaches.  Psychiatric/Behavioral: Negative for dysphoric mood.       Objective:   Physical Exam  Constitutional: Vital signs are normal. She appears well-developed and well-nourished. She is cooperative.  Non-toxic appearance. She does not appear ill. No distress.  HENT:  Head: Normocephalic.  Right Ear: Hearing, tympanic membrane, external ear and ear canal normal.  Left Ear: Hearing, tympanic membrane, external ear and ear canal normal.  Nose: Nose normal.  Eyes: Conjunctivae, EOM and lids are normal. Pupils are equal, round, and reactive to light. Lids are everted and swept, no foreign bodies found.  Neck: Trachea normal and normal range  of motion. Neck supple. Carotid bruit is not present. No thyroid mass and no thyromegaly present.  Cardiovascular: Normal rate, regular rhythm, S1 normal, S2 normal, normal heart sounds and intact distal pulses. Exam reveals no gallop.  No murmur heard. Pulmonary/Chest: Effort normal and breath sounds normal. No respiratory distress. She has no wheezes. She has no rhonchi. She has no rales.  Abdominal: Soft. Normal appearance and bowel sounds are normal. She  exhibits no distension, no fluid wave, no abdominal bruit and no mass. There is no hepatosplenomegaly. There is no tenderness. There is no rebound, no guarding and no CVA tenderness. No hernia.  Genitourinary: Vagina normal and uterus normal. No breast swelling, tenderness, discharge or bleeding. Pelvic exam was performed with patient supine. There is no rash, tenderness or lesion on the right labia. There is no rash, tenderness or lesion on the left labia. Uterus is not enlarged and not tender. Cervix exhibits no motion tenderness, no discharge and no friability. Right adnexum displays no mass, no tenderness and no fullness. Left adnexum displays no mass, no tenderness and no fullness.  Lymphadenopathy:    She has no cervical adenopathy.    She has no axillary adenopathy.  Neurological: She is alert. She has normal strength. No cranial nerve deficit or sensory deficit.  Skin: Skin is warm, dry and intact. No rash noted.  Psychiatric: Her speech is normal and behavior is normal. Judgment normal. Her mood appears not anxious. Cognition and memory are normal. She does not exhibit a depressed mood.          Assessment & Plan:  The patient's preventative maintenance and recommended screening tests for an annual wellness exam were reviewed in full today. Brought up to date unless services declined.  Counselled on the importance of diet, exercise, and its role in overall health and mortality. The patient's FH and SH was reviewed, including their home life, tobacco status, and drug and alcohol status.   Vaccines: Up to date  Colon: 04/2010 no polyps.. Repeat in 10 years. Dr. Juanda Chance.  Mammo:Last 04/2017 nml. DXA: osteopenia 02/2009 on fosamax (on for 5-6 years, never diagnosed with osteoporosis)... Stopped in May 2013 when she ran out.Marland Kitchen DEXA 04/22/2013: osteoporosis, started back on fosamax. Recheck in 04/2015: worsened.. Started on prolia 05/2015 No issues with that.  04/2017 on yr 2  prolia..Improvement in spine, none yet in hip  PAP/DVE: Previously done at [facility], but they retired. Last PAP done nml 2011, all previous were normal 3 in a row,has history of abnormal pap and crypothrapy 20 years ago, No further paps needed, plan every few year DVE. Asymptomatic. Hep C:neg

## 2017-07-15 ENCOUNTER — Encounter: Payer: Self-pay | Admitting: Family Medicine

## 2017-07-16 ENCOUNTER — Other Ambulatory Visit: Payer: Self-pay

## 2017-07-16 MED ORDER — GABAPENTIN 600 MG PO TABS: 600.0000 mg | ORAL_TABLET | Freq: Three times a day (TID) | ORAL | 2 refills | Status: DC

## 2017-07-16 MED ORDER — GABAPENTIN 300 MG PO CAPS: 600.0000 mg | ORAL_CAPSULE | Freq: Three times a day (TID) | ORAL | 1 refills | Status: DC

## 2017-07-18 DIAGNOSIS — H2513 Age-related nuclear cataract, bilateral: Secondary | ICD-10-CM | POA: Diagnosis not present

## 2017-07-19 ENCOUNTER — Other Ambulatory Visit: Payer: Self-pay | Admitting: Family Medicine

## 2017-07-19 MED ORDER — GABAPENTIN 600 MG PO TABS: 600.0000 mg | ORAL_TABLET | Freq: Three times a day (TID) | ORAL | 3 refills | Status: DC

## 2017-08-01 ENCOUNTER — Telehealth: Payer: Self-pay | Admitting: *Deleted

## 2017-08-01 NOTE — Telephone Encounter (Signed)
Information has been submitted to pts insurance for verification of benefits. Awaiting response for coverage  

## 2017-08-08 NOTE — Telephone Encounter (Signed)
Verification of benefits have been processed and an approval has been received for pts prolia injection. Pts estimated cost are appx $245. This is only an estimate and cannot be confirmed until benefits are paid. Please advise pt and schedule if needed. If scheduled, once the injection is received, pls contact me back with the date it was received so that I am able to update prolia folder. thanks   PA required. Contacted [employer] and requested PA form; to be received within . Will await form and completion before contacting pt to guarantee coverage before she agrees to out of pocket charges.

## 2017-08-08 NOTE — Telephone Encounter (Signed)
Form received and completed. Placed in Dr Daphine Deutscher inbox for review and signature

## 2017-08-09 NOTE — Telephone Encounter (Signed)
Form reviewed and signed. Faxed back to requesting party; awaiting response

## 2017-08-12 NOTE — Telephone Encounter (Signed)
Fax received indicating approval valid through 08/17/18. auth # [id number].  Spoke to pt and advised; agreeable to out of pocket charges. Labs and injection previously scheduled.

## 2017-08-22 ENCOUNTER — Telehealth: Payer: Self-pay | Admitting: Family Medicine

## 2017-08-22 DIAGNOSIS — M810 Age-related osteoporosis without current pathological fracture: Secondary | ICD-10-CM

## 2017-08-22 NOTE — Telephone Encounter (Signed)
-----   Message from [name] sent at 08/15/2017 10:18 AM EDT ----- Regarding: Lab orders for Friday, 4.12.19 Lab orders for a prolia injection

## 2017-08-23 ENCOUNTER — Other Ambulatory Visit (INDEPENDENT_AMBULATORY_CARE_PROVIDER_SITE_OTHER): Payer: Medicare HMO

## 2017-08-23 DIAGNOSIS — M810 Age-related osteoporosis without current pathological fracture: Secondary | ICD-10-CM

## 2017-08-23 LAB — CALCIUM: Calcium: 9.9 mg/dL (ref 8.4–10.5)

## 2017-08-28 ENCOUNTER — Ambulatory Visit (INDEPENDENT_AMBULATORY_CARE_PROVIDER_SITE_OTHER): Payer: Medicare HMO | Admitting: *Deleted

## 2017-08-28 DIAGNOSIS — M818 Other osteoporosis without current pathological fracture: Secondary | ICD-10-CM

## 2017-08-28 MED ORDER — DENOSUMAB 60 MG/ML ~~LOC~~ SOLN
60.0000 mg | Freq: Once | SUBCUTANEOUS | Status: AC
  Administered 2017-08-28: 60 mg via SUBCUTANEOUS

## 2017-09-10 DIAGNOSIS — R350 Frequency of micturition: Secondary | ICD-10-CM | POA: Diagnosis not present

## 2017-09-10 DIAGNOSIS — R300 Dysuria: Secondary | ICD-10-CM | POA: Diagnosis not present

## 2017-09-17 ENCOUNTER — Encounter: Payer: Self-pay | Admitting: Family Medicine

## 2017-09-17 NOTE — Telephone Encounter (Signed)
Please call to get more info

## 2017-09-17 NOTE — Telephone Encounter (Signed)
Spoke with Yesenia Jones.  She just went to [place] for 3 weeks.  While on vacation she developed a UTI.  She states her urine was bloody and a lot more severe that the ones she has had in the past.  She was seen at [facility] and they treated her with Septra 800-160 mg and pyridium.  She finishes her antibiotics today.  She states her stomach is bothering her a little bit and long with some diarrhea.  I advised that is probably coming from the antibiotics and should get better now she has completed the course.  She states she only seems to get UTIs when traveling.  She states they drive so there is a lot of sitting and there may be a little more alcohol intake than normal and wants to know if this could contribute to her getting UTIs.  She is also wanting to know if she needs to follow up with Dr. Ermalene Searing.  Please advise.

## 2017-09-18 NOTE — Telephone Encounter (Signed)
I did.  See message below.

## 2017-11-11 ENCOUNTER — Encounter: Payer: Self-pay | Admitting: Sports Medicine

## 2017-11-11 ENCOUNTER — Ambulatory Visit
Admission: RE | Admit: 2017-11-11 | Discharge: 2017-11-11 | Disposition: A | Discharge status: Home / Self Care | Payer: Medicare HMO | Source: Ambulatory Visit | Attending: Sports Medicine | Admitting: Sports Medicine

## 2017-11-11 ENCOUNTER — Ambulatory Visit: Payer: Medicare HMO | Admitting: Sports Medicine

## 2017-11-11 VITALS — BP 114/70 | Ht 65.0 in | Wt 129.0 lb

## 2017-11-11 DIAGNOSIS — G8929 Other chronic pain: Secondary | ICD-10-CM

## 2017-11-11 DIAGNOSIS — M25561 Pain in right knee: Principal | ICD-10-CM

## 2017-11-11 DIAGNOSIS — M1711 Unilateral primary osteoarthritis, right knee: Secondary | ICD-10-CM

## 2017-11-11 NOTE — Patient Instructions (Signed)
We want want you to do the following things for your right knee pain which is likely from arthritis: --Ice your knee after your work out --Wear a knee sleeve when you work out or have a very active day --Isometric exercises as instructed by our athletic trainer --Ibuprofen as needed with food or tylenol ( no more than 4000 mg a day)  --Will contact you with the results of your X ray --make a a follow up appointment in 4 weeks for reevaluation  Take Care

## 2017-11-11 NOTE — Progress Notes (Signed)
   Subjective:    Patient ID: Yesenia Jones, female    DOB: [birth year], 72 y.o.   MRN: [id number]  HPI Patient is a 72 year old female who presents today complaining of right knee pain.  Patient reports her symptoms started about a year ago.  Right knee pain has been gradually worsening in the past 6 months.  Patient reports tenderness in the medial lateral and patellar area of her knee.  She is had some swelling accompanied with her tenderness.  Patient also reports radiation of her pain down her right leg.  Patient has used conservative measures for management such as icing and compression sleeve with minimal improvement in symptoms.  She also reports taking ibuprofen and Tylenol as needed when pain is severe and interfering with daily living activities.  Patient works out 5 times a week.  She seems to be tolerating a elliptical and stationary bike well.  Patient has had a total left knee replacement about 7 years ago.    Review of Systems  Constitutional: Negative.   HENT: Negative.   Eyes: Negative.   Respiratory: Negative.   Cardiovascular: Negative.   Gastrointestinal: Negative.   Endocrine: Negative.   Genitourinary: Negative.   Musculoskeletal: Positive for arthralgias, joint swelling and myalgias.       Right knee   Skin: Negative.   Allergic/Immunologic: Negative.   Neurological: Negative.   Hematological: Negative.   Psychiatric/Behavioral: Negative.        Objective:   Physical Exam -R Knee: mild varus deformity, no swelling or effusion.  Tender to palpation along join line and along patella. Crepitus noted during extension and flexion. Strength 5/5 in all fields. FROM. Patellar grind test negative. Stable to varus and valgus stress. Anterior and posterior drawers negative. Lachman's negative. McMurray and Thessaly negative.        Assessment & Plan:  # Right  Knee pain and swelling, chronic Patient presented with right knee pain for the past year worse in the last 6  months. Patient with knee pain radiating down her right leg to her ankle, pain is diffused (joint line and patella).  Patient has had total left knee replacement s/p injury in the past however this pain is different. On exam patient has crepitus but no swelling and minimal tenderness on palpation. Pain is fairly well controlled with NSAIDs prn. Based on patient age, symptoms, duration and exam findings likely to be OA. However, would need to also rule out meniscus injury.  --Order AP, lateral and sunrise, standing films of right knee. Will contact patient with results. --Isometric exercises to strengthen surrounding muscle groups. --Icing post work out and on days patient is very active. --Compression sleeve during work out to aid with swelling. --Ibuprofen or tylenol as needed. --Patient will follow up in 4 weeks for reevaluation.  Patient seen and evaluated with the resident. I agree with the above plan of care. X-ray of the right knee shows advanced medial compartmental narrowing. Initial treatment as above. Follow-up in 4 weeks for reevaluation. I did discuss the possibility of cortisone injection in the future if symptoms warrant. We may also discuss a medial un-loader brace.

## 2017-12-03 DIAGNOSIS — R69 Illness, unspecified: Secondary | ICD-10-CM | POA: Diagnosis not present

## 2017-12-04 DIAGNOSIS — R69 Illness, unspecified: Secondary | ICD-10-CM | POA: Diagnosis not present

## 2017-12-09 ENCOUNTER — Encounter: Payer: Self-pay | Admitting: Sports Medicine

## 2017-12-09 ENCOUNTER — Ambulatory Visit: Payer: Medicare HMO | Admitting: Sports Medicine

## 2017-12-09 DIAGNOSIS — M1711 Unilateral primary osteoarthritis, right knee: Secondary | ICD-10-CM | POA: Diagnosis not present

## 2017-12-09 MED ORDER — METHYLPREDNISOLONE ACETATE 40 MG/ML IJ SUSP
40.0000 mg | Freq: Once | INTRAMUSCULAR | Status: AC
  Administered 2017-12-09: 40 mg via INTRA_ARTICULAR

## 2017-12-09 NOTE — Assessment & Plan Note (Signed)
review of AP/lateral/sunrise films done on November 11, 2017 show severe degenerative joint disease noted on the medial aspect of her right knee consistent with osteoarthritic changes.   Due to the patient's continued pain did a in office intra-articular steroid injection.  The patient tolerated the procedure well.    -Encourage the patient to use a medial unloader brace. -Follow-up in 3 weeks -Use acetaminophen/ibuprofen and heat on an as-needed basis

## 2017-12-09 NOTE — Progress Notes (Signed)
    HPI [name] is a 72 y.o f with osteoarthritis of bilateral lower knees s/p knee replacement to left knee, Morton's neuroma, plantar fasciitis, talipes cavus who presents for follow-up regarding her left knee.  She has been having right knee pain for several years and it has worsened over the past year. She describes the pain as 7-8 out of 10 intensity most noted on the medial aspect of right knee.  She states that she also has a feeling of someone pouring molten metal down her right leg.  She has been using acetaminophen/ibuprofen and knee sleeve without much relief.  She states that she finds occasionally relieved with heat.  She has had difficulty standing for long durations when cooking.  She has noted occasional swelling.  She is continue to work out.  Objective   Vitals:   12/09/17 0859  BP: 109/68   Physical Exam  Constitutional: She appears well-developed and well-nourished. No distress.  HENT:  Head: Normocephalic and atraumatic.  Eyes: Conjunctivae are normal.  Respiratory: Effort normal and breath sounds normal.  Musculoskeletal: She exhibits tenderness (no tenderness to palpation over patella). She exhibits no edema or deformity.  No warmth to touch over right knee. 5/5 strength in bilateral lower extremities  Skin: She is not diaphoretic. No erythema.  Psychiatric: She has a normal mood and affect. Her behavior is normal. Judgment and thought content normal.    Right knee injection procedure: Consent obtained and verified. Sterile betadine prep. Furthur cleansed with alcohol. Topical analgesic spray: Ethyl chloride. Joint: Right knee Approached in typical fashion inferolaterally Completed without difficulty Meds: 40 mg Depoedrol, 3cc 1% xylocaine Needle: 25-gauge 1.5 inch Aftercare instructions and Red flags advised.  Assessment and Plan  Right knee osteoarthritis review of AP/lateral/sunrise films done on November 11, 2017 show severe degenerative joint disease  noted on the medial aspect of her right knee consistent with osteoarthritic changes.   Due to the patient's continued pain did a in office intra-articular steroid injection.  The patient tolerated the procedure well.   -Follow-up in 3 weeks -Use acetaminophen/ibuprofen and heat on an as-needed basis  Patient seen and evaluated with the resident. I agree with the above plan of care. Patient's x-rays show advanced medial compartmental DJD. There is no doubt she has arthritis in this knee. I recommend a cortisone injection. Patient agrees. This was accomplished atraumatically under sterile technique after risks and benefits were explained. Patient tolerated this without difficulty. I had originally thought about prescribing a medial unloader brace but she finds the simple compression sleeve to be uncomfortable so I don't think she would tolerate that sort of brace. Of note, she is also complaining of some chronic neuropathic pain in her left leg. She has seen Dr. Darrick Jones in the past for this. She's currently on gabapentin.She also states that she has a Morton's neuroma in her right foot which is starting to bother her. She has custom orthotics but did not bring them with her. I recommended that she bring them with her to her follow-up visit. We may want to try a neuroma pad.

## 2017-12-09 NOTE — Patient Instructions (Signed)
It was a pleasure to see you today Yesenia Jones.  They received a intra-articular steroid injection to help you with the pain.  Please resume activity slowly.  Consider using a sleeve or medial unloader brace for pain relief.  You can also use acetaminophen and ibuprofen and heat as needed for pain.  He is follow-up in 3 weeks.

## 2017-12-30 ENCOUNTER — Ambulatory Visit: Payer: Medicare HMO | Admitting: Sports Medicine

## 2017-12-30 VITALS — BP 110/82 | Ht 65.5 in | Wt 129.0 lb

## 2017-12-30 DIAGNOSIS — M1711 Unilateral primary osteoarthritis, right knee: Secondary | ICD-10-CM | POA: Diagnosis not present

## 2017-12-30 NOTE — Progress Notes (Signed)
   Subjective:    Patient ID: Yesenia Jones, female    DOB: Feb 20, 1946, 72 y.o.   MRN: [id number]  HPI   Patient comes in today to follow-up on right knee pain secondary to DJD. Recent cortisone injection was only minimally beneficial. She is ready to discuss total knee arthroplasty.   Review of Systems As above    Objective:   Physical Exam  Well-developed, well-nourished. No acute distress.  Right knee: Full range motion. No obvious effusion. Tenderness to palpation along the medial joint line. Pain but no popping with McMurray's. Knee is grossly stable to ligamentous exam. Good pulses.      Assessment & Plan:   Right knee pain secondary to DJD  Previous x-rays have shown significant arthritis in this right knee. She would like to me with Dr. Magnus Ivan to discuss total knee arthroplasty. I'm happy to arrange this for her. I'll defer further workup and treatment to the discretion of Dr. Magnus Ivan she will follow-up with [place] as needed.

## 2018-01-14 ENCOUNTER — Encounter (INDEPENDENT_AMBULATORY_CARE_PROVIDER_SITE_OTHER): Payer: Self-pay | Admitting: Orthopaedic Surgery

## 2018-01-14 ENCOUNTER — Ambulatory Visit (INDEPENDENT_AMBULATORY_CARE_PROVIDER_SITE_OTHER): Payer: Medicare HMO | Admitting: Orthopaedic Surgery

## 2018-01-14 DIAGNOSIS — G8929 Other chronic pain: Secondary | ICD-10-CM

## 2018-01-14 DIAGNOSIS — M1711 Unilateral primary osteoarthritis, right knee: Secondary | ICD-10-CM

## 2018-01-14 DIAGNOSIS — M25561 Pain in right knee: Secondary | ICD-10-CM

## 2018-01-14 NOTE — Progress Notes (Signed)
Office Visit Note   Patient: Yesenia Jones           Date of Birth: [birth year]           MRN: [id number] Visit Date: 01/14/2018              Requested by: Excell Seltzer, MD [address] [place], [place] [address] PCP: Excell Seltzer, MD   Assessment & Plan: Visit Diagnoses:  1. Chronic pain of right knee   2. Unilateral primary osteoarthritis, right knee     Plan: We had a long thorough discussion about knee replacement surgery for her right knee.  I showed her her x-rays and went over in detail what the surgery involves.  Having had this before, she is well aware the risk and benefits.  She is concerned about continued nerve pain.  Hopefully being more balanced on the right knee can take pressure off the left knee.  I showed her knee model explained in detail what her intraoperative and postoperative course with involved.  Hopefully being more experienced that knee replacement surgery at this point and being active as well as a little older she will have a much better outcome with this right knee.  We had a long thorough discussion again about this.  All question concerns were answered and addressed.  We will work on getting this scheduled.  Follow-Up Instructions: Return for 2 weeks post-op.   Orders:  No orders of the defined types were placed in this encounter.  No orders of the defined types were placed in this encounter.     Procedures: No procedures performed   Clinical Data: No additional findings.   Subjective: Chief Complaint  Patient presents with  . Right Knee - Pain  Patient is a very pleasant 72 year old female who comes in with chief complaint of right knee pain.  She is been followed by Dr. Margaretha Sheffield for this knee.  It hurts her daily and is become 10 out of 10 in terms of pain waking her up at night.  It does detrimental effect directives daily living, her quality of life, and her mobility.  This is been going on for a year.  She has tried steroid  injections and does not help.  She gets a lot of clicking and popping in her knee as well as swelling and stiffness.  Again she wakes with pain.  She denies any locking catching.  She has had a previous left total knee arthroplasty is been dealing with nerve pain in her foot since then.  That knee was done 7 years ago by another Careers adviser in town.  At this point she would like to consider other interventions such as knee replacement on the right side given the failure of all forms conservative treatment measures.  Her husband is with her today as well.  She is not a diabetic and not a smoker.  She denies any specific medical problems.  She is very active and thin 72 years old.  HPI  Review of Systems She currently denies any headache, chest pain, shortness of breath, fever, chills, nausea, vomiting.  Objective: Vital Signs: There were no vitals taken for this visit.  Physical Exam She is alert and oriented x3 and in no acute distress Ortho Exam Examination of her right knee shows full range of motion of the knee.  Is ligamentously stable.  She has varus malalignment of that knee.  She has medial joint line tenderness as well as  patellofemoral tenderness.  There is a lot of patellofemoral crepitation as well.  The varus malalignment is correctable.  Examination of her left knee that was replaced shows full range of motion of that knee.  Feels stable and has a well-healed surgical incision. Specialty Comments:  No specialty comments available.  Imaging: No results found. Independent review of x-rays of the right knee shows tricompartment arthritic changes with varus malalignment.  There is medial joint space narrowing as well as severe patellofemoral disease.  PMFS History: Patient Active Problem List   Diagnosis Date Noted  . Unilateral primary osteoarthritis, right knee 01/14/2018  . Osteoarthritis of right knee 12/09/2017  . Plantar fasciitis 02/07/2016  . Prediabetes 06/17/2015  .  Counseling regarding end of life decision making 05/28/2014  . Hearing loss 04/04/2012  . Tinnitus 04/04/2012  . Hyperlipidemia 04/14/2010  . MORTON'S NEUROMA 04/14/2010  . TALIPES CAVUS 04/14/2010  . Osteoporosis 03/17/2010  . ALLERGIC RHINITIS 08/25/2008  . Osteoarthrosis, unspecified whether generalized or localized, involving lower leg 07/21/2007   Past Medical History:  Diagnosis Date  . Hyperlipidemia     Family History  Problem Relation Age of Onset  . Stroke Mother   . Hyperlipidemia Mother   . Hyperlipidemia Father   . Cancer Maternal Aunt        breast cancer    Past Surgical History:  Procedure Laterality Date  . CHOLECYSTECTOMY    . KNEE SURGERY     Social History   Occupational History  . Occupation: retired for [employer]: RETIRED  Tobacco Use  . Smoking status: Never Smoker  . Smokeless tobacco: Never Used  Substance and Sexual Activity  . Alcohol use: Yes    Alcohol/week: 3.0 standard drinks    Types: 3 Glasses of wine per week  . Drug use: No  . Sexual activity: Not on file

## 2018-01-17 NOTE — Progress Notes (Signed)
Please place orders in Epic as patient is being scheduled for a pre-op appointment! Thank you! 

## 2018-01-20 ENCOUNTER — Telehealth: Payer: Self-pay | Admitting: *Deleted

## 2018-01-20 NOTE — Patient Instructions (Addendum)
KADIATOU OPLINGER  01/20/2018   Your procedure is scheduled on: Friday 01/31/2018  Report to [facility] Main  Entrance              Report to admitting at  0630  AM    Call this number if you have problems the morning of surgery [phone]    Remember: Do not eat food or drink liquids :After Midnight.      Take these medicines the morning of surgery with A SIP OF WATER: Gabapentin (Neurontin)                                 You may not have any metal on your body including hair pins and              piercings  Do not wear jewelry, make-up, lotions, powders or perfumes, deodorant             Do not wear nail polish.  Do not shave  48 hours prior to surgery.               Do not bring valuables to the hospital. [place] IS NOT             RESPONSIBLE   FOR VALUABLES.  Contacts, dentures or bridgework may not be worn into surgery.  Leave suitcase in the car. After surgery it may be brought to your room.                  Please read over the following fact sheets you were given: Incentive spirometry sheet _____________________________________________________________________             [facility] - Preparing for Surgery Before surgery, you can play an important role.  Because skin is not sterile, your skin needs to be as free of germs as possible.  You can reduce the number of germs on your skin by washing with CHG (chlorahexidine gluconate) soap before surgery.  CHG is an antiseptic cleaner which kills germs and bonds with the skin to continue killing germs even after washing. Please DO NOT use if you have an allergy to CHG or antibacterial soaps.  If your skin becomes reddened/irritated stop using the CHG and inform your nurse when you arrive at Short Stay. Do not shave (including legs and underarms) for at least 48 hours prior to the first CHG shower.  You may shave your face/neck. Please follow these instructions carefully:  1.  Shower with CHG  Soap the night before surgery and the  morning of Surgery.  2.  If you choose to wash your hair, wash your hair first as usual with your  normal  shampoo.  3.  After you shampoo, rinse your hair and body thoroughly to remove the  shampoo.                           4.  Use CHG as you would any other liquid soap.  You can apply chg directly  to the skin and wash                       Gently with a scrungie or clean washcloth.  5.  Apply the CHG Soap to your body ONLY FROM THE NECK DOWN.  Do not use on face/ open                           Wound or open sores. Avoid contact with eyes, ears mouth and genitals (private parts).                       Wash face,  Genitals (private parts) with your normal soap.             6.  Wash thoroughly, paying special attention to the area where your surgery  will be performed.  7.  Thoroughly rinse your body with warm water from the neck down.  8.  DO NOT shower/wash with your normal soap after using and rinsing off  the CHG Soap.                9.  Pat yourself dry with a clean towel.            10.  Wear clean pajamas.            11.  Place clean sheets on your bed the night of your first shower and do not  sleep with pets. Day of Surgery : Do not apply any lotions/deodorants the morning of surgery.  Please wear clean clothes to the hospital/surgery center.  FAILURE TO FOLLOW THESE INSTRUCTIONS MAY RESULT IN THE CANCELLATION OF YOUR SURGERY PATIENT SIGNATURE_________________________________  NURSE SIGNATURE__________________________________  ________________________________________________________________________

## 2018-01-20 NOTE — Progress Notes (Signed)
Please place orders in Epic as patient has a pre-op appointment on 01/23/2018! Thank you!

## 2018-01-20 NOTE — Telephone Encounter (Signed)
Information has been submitted to pts insurance for verification of benefits. Awaiting response for coverage  

## 2018-01-21 ENCOUNTER — Other Ambulatory Visit (INDEPENDENT_AMBULATORY_CARE_PROVIDER_SITE_OTHER): Payer: Self-pay | Admitting: Physician Assistant

## 2018-01-22 ENCOUNTER — Other Ambulatory Visit (INDEPENDENT_AMBULATORY_CARE_PROVIDER_SITE_OTHER): Payer: Self-pay

## 2018-01-23 ENCOUNTER — Other Ambulatory Visit: Payer: Self-pay

## 2018-01-23 ENCOUNTER — Encounter (HOSPITAL_COMMUNITY): Payer: Self-pay

## 2018-01-23 ENCOUNTER — Encounter (HOSPITAL_COMMUNITY)
Admission: RE | Admit: 2018-01-23 | Discharge: 2018-01-23 | Disposition: A | Discharge status: Home / Self Care | Payer: Medicare HMO | Source: Ambulatory Visit | Attending: Orthopaedic Surgery | Admitting: Orthopaedic Surgery

## 2018-01-23 DIAGNOSIS — Z01812 Encounter for preprocedural laboratory examination: Secondary | ICD-10-CM | POA: Diagnosis present

## 2018-01-23 HISTORY — DX: Unspecified osteoarthritis, unspecified site: M19.90

## 2018-01-23 LAB — HEMOGLOBIN A1C
HEMOGLOBIN A1C: 5.7 % — AB (ref 4.8–5.6)
MEAN PLASMA GLUCOSE: 116.89 mg/dL

## 2018-01-23 LAB — BASIC METABOLIC PANEL
ANION GAP: 8 (ref 5–15)
BUN: 14 mg/dL (ref 8–23)
CO2: 29 mmol/L (ref 22–32)
Calcium: 10.9 mg/dL — ABNORMAL HIGH (ref 8.9–10.3)
Chloride: 103 mmol/L (ref 98–111)
Creatinine, Ser: 0.83 mg/dL (ref 0.44–1.00)
GFR calc Af Amer: >60 mL/min (ref >60)
Glucose, Bld: 96 mg/dL (ref 70–99)
POTASSIUM: 4.5 mmol/L (ref 3.5–5.1)
SODIUM: 140 mmol/L (ref 135–145)

## 2018-01-23 LAB — SURGICAL PCR SCREEN
MRSA, PCR: NEGATIVE
Staphylococcus aureus: NEGATIVE

## 2018-01-23 LAB — CBC
HEMATOCRIT: 41.4 % (ref 36.0–46.0)
HEMOGLOBIN: 13.6 g/dL (ref 12.0–15.0)
MCH: 30.8 pg (ref 26.0–34.0)
MCHC: 32.9 g/dL (ref 30.0–36.0)
MCV: 93.7 fL (ref 78.0–100.0)
Platelets: 262 10*3/uL (ref 150–400)
RBC: 4.42 MIL/uL (ref 3.87–5.11)
RDW: 14.2 % (ref 11.5–15.5)
WBC: 4.8 10*3/uL (ref 4.0–10.5)

## 2018-01-31 ENCOUNTER — Observation Stay (HOSPITAL_COMMUNITY): Payer: Medicare HMO

## 2018-01-31 ENCOUNTER — Ambulatory Visit (HOSPITAL_COMMUNITY): Payer: Medicare HMO | Admitting: Anesthesiology

## 2018-01-31 ENCOUNTER — Encounter (HOSPITAL_COMMUNITY)
Admission: RE | Disposition: A | Discharge status: Home / Self Care | Payer: Self-pay | Source: Ambulatory Visit | Attending: Orthopaedic Surgery

## 2018-01-31 ENCOUNTER — Observation Stay (HOSPITAL_COMMUNITY)
Admission: RE | Admit: 2018-01-31 | Discharge: 2018-02-01 | Disposition: A | Discharge status: Home / Self Care | Payer: Medicare HMO | Source: Ambulatory Visit | Attending: Orthopaedic Surgery | Admitting: Orthopaedic Surgery

## 2018-01-31 ENCOUNTER — Encounter (HOSPITAL_COMMUNITY): Payer: Self-pay | Admitting: *Deleted

## 2018-01-31 ENCOUNTER — Other Ambulatory Visit: Payer: Self-pay

## 2018-01-31 DIAGNOSIS — E785 Hyperlipidemia, unspecified: Secondary | ICD-10-CM | POA: Insufficient documentation

## 2018-01-31 DIAGNOSIS — Z79899 Other long term (current) drug therapy: Secondary | ICD-10-CM | POA: Insufficient documentation

## 2018-01-31 DIAGNOSIS — M1711 Unilateral primary osteoarthritis, right knee: Secondary | ICD-10-CM | POA: Diagnosis not present

## 2018-01-31 DIAGNOSIS — Z7982 Long term (current) use of aspirin: Secondary | ICD-10-CM | POA: Diagnosis not present

## 2018-01-31 DIAGNOSIS — G8918 Other acute postprocedural pain: Secondary | ICD-10-CM | POA: Diagnosis not present

## 2018-01-31 DIAGNOSIS — Z885 Allergy status to narcotic agent status: Secondary | ICD-10-CM | POA: Diagnosis not present

## 2018-01-31 DIAGNOSIS — Z471 Aftercare following joint replacement surgery: Secondary | ICD-10-CM | POA: Diagnosis not present

## 2018-01-31 DIAGNOSIS — Z96651 Presence of right artificial knee joint: Secondary | ICD-10-CM

## 2018-01-31 HISTORY — PX: TOTAL KNEE ARTHROPLASTY: SHX125

## 2018-01-31 SURGERY — ARTHROPLASTY, KNEE, TOTAL
Anesthesia: Regional | Site: Knee | Laterality: Right

## 2018-01-31 MED ORDER — DIPHENHYDRAMINE HCL 12.5 MG/5ML PO ELIX: 12.5000 mg | ORAL_SOLUTION | ORAL | Status: DC | PRN

## 2018-01-31 MED ORDER — MIDAZOLAM HCL 2 MG/2ML IJ SOLN
2.0000 mg | Freq: Once | INTRAMUSCULAR | Status: AC
  Administered 2018-01-31: 2 mg via INTRAVENOUS
  Filled 2018-01-31: qty 2

## 2018-01-31 MED ORDER — ACETAMINOPHEN 325 MG PO TABS: 325.0000 mg | ORAL_TABLET | Freq: Four times a day (QID) | ORAL | Status: DC | PRN

## 2018-01-31 MED ORDER — ROPIVACAINE HCL 5 MG/ML IJ SOLN
INTRAMUSCULAR | Status: DC | PRN
  Administered 2018-01-31: 30 mL via PERINEURAL

## 2018-01-31 MED ORDER — PROPOFOL 10 MG/ML IV BOLUS
INTRAVENOUS | Status: DC | PRN
  Administered 2018-01-31: 20 mg via INTRAVENOUS
  Administered 2018-01-31: 30 mg via INTRAVENOUS

## 2018-01-31 MED ORDER — DEXAMETHASONE SODIUM PHOSPHATE 10 MG/ML IJ SOLN
INTRAMUSCULAR | Status: DC | PRN
  Administered 2018-01-31: 10 mg via INTRAVENOUS

## 2018-01-31 MED ORDER — FENTANYL CITRATE (PF) 100 MCG/2ML IJ SOLN
25.0000 ug | INTRAMUSCULAR | Status: DC | PRN
  Administered 2018-01-31: 50 ug via INTRAVENOUS

## 2018-01-31 MED ORDER — PROMETHAZINE HCL 25 MG/ML IJ SOLN: 12.5000 mg | Freq: Four times a day (QID) | INTRAMUSCULAR | Status: DC | PRN

## 2018-01-31 MED ORDER — PROPOFOL 500 MG/50ML IV EMUL
INTRAVENOUS | Status: DC | PRN
  Administered 2018-01-31: 75 ug/kg/min via INTRAVENOUS

## 2018-01-31 MED ORDER — DOCUSATE SODIUM 100 MG PO CAPS
100.0000 mg | ORAL_CAPSULE | Freq: Two times a day (BID) | ORAL | Status: DC
  Administered 2018-02-01: 100 mg via ORAL
  Filled 2018-01-31: qty 1

## 2018-01-31 MED ORDER — LACTATED RINGERS IV SOLN
INTRAVENOUS | Status: DC
  Administered 2018-01-31 (×2): via INTRAVENOUS

## 2018-01-31 MED ORDER — ONDANSETRON HCL 4 MG/2ML IJ SOLN
INTRAMUSCULAR | Status: AC
  Filled 2018-01-31: qty 2

## 2018-01-31 MED ORDER — METHOCARBAMOL 500 MG IVPB - SIMPLE MED
500.0000 mg | Freq: Four times a day (QID) | INTRAVENOUS | Status: DC | PRN
  Administered 2018-01-31: 500 mg via INTRAVENOUS
  Filled 2018-01-31: qty 50

## 2018-01-31 MED ORDER — SODIUM CHLORIDE 0.9 % IJ SOLN
INTRAMUSCULAR | Status: AC
  Filled 2018-01-31: qty 20

## 2018-01-31 MED ORDER — METOCLOPRAMIDE HCL 5 MG PO TABS: 5.0000 mg | ORAL_TABLET | Freq: Three times a day (TID) | ORAL | Status: DC | PRN

## 2018-01-31 MED ORDER — KETOROLAC TROMETHAMINE 15 MG/ML IJ SOLN
15.0000 mg | Freq: Four times a day (QID) | INTRAMUSCULAR | Status: DC
  Administered 2018-01-31 – 2018-02-01 (×5): 15 mg via INTRAVENOUS
  Filled 2018-01-31 (×5): qty 1

## 2018-01-31 MED ORDER — METHOCARBAMOL 500 MG IVPB - SIMPLE MED
INTRAVENOUS | Status: AC
  Filled 2018-01-31: qty 50

## 2018-01-31 MED ORDER — FENTANYL CITRATE (PF) 100 MCG/2ML IJ SOLN
100.0000 ug | Freq: Once | INTRAMUSCULAR | Status: AC
  Administered 2018-01-31: 50 ug via INTRAVENOUS
  Filled 2018-01-31: qty 2

## 2018-01-31 MED ORDER — HYDROMORPHONE HCL 1 MG/ML IJ SOLN: 0.5000 mg | INTRAMUSCULAR | Status: DC | PRN

## 2018-01-31 MED ORDER — TRANEXAMIC ACID 1000 MG/10ML IV SOLN
1000.0000 mg | INTRAVENOUS | Status: AC
  Administered 2018-01-31: 1000 mg via INTRAVENOUS
  Filled 2018-01-31: qty 10

## 2018-01-31 MED ORDER — ALUM & MAG HYDROXIDE-SIMETH 200-200-20 MG/5ML PO SUSP: 30.0000 mL | ORAL | Status: DC | PRN

## 2018-01-31 MED ORDER — CLINDAMYCIN PHOSPHATE 900 MG/50ML IV SOLN
900.0000 mg | INTRAVENOUS | Status: AC
  Administered 2018-01-31: 900 mg via INTRAVENOUS
  Filled 2018-01-31: qty 50

## 2018-01-31 MED ORDER — SODIUM CHLORIDE 0.9 % IJ SOLN
INTRAMUSCULAR | Status: AC
  Filled 2018-01-31: qty 10

## 2018-01-31 MED ORDER — ONDANSETRON HCL 4 MG PO TABS
4.0000 mg | ORAL_TABLET | Freq: Four times a day (QID) | ORAL | Status: DC | PRN
  Administered 2018-02-01: 4 mg via ORAL
  Filled 2018-01-31 (×2): qty 1

## 2018-01-31 MED ORDER — BUPIVACAINE LIPOSOME 1.3 % IJ SUSP
20.0000 mL | Freq: Once | INTRAMUSCULAR | Status: AC
  Administered 2018-01-31: 20 mL
  Filled 2018-01-31: qty 20

## 2018-01-31 MED ORDER — DEXAMETHASONE SODIUM PHOSPHATE 10 MG/ML IJ SOLN
INTRAMUSCULAR | Status: AC
  Filled 2018-01-31: qty 1

## 2018-01-31 MED ORDER — ONDANSETRON HCL 4 MG/2ML IJ SOLN
4.0000 mg | Freq: Once | INTRAMUSCULAR | Status: AC | PRN
  Administered 2018-01-31: 4 mg via INTRAVENOUS

## 2018-01-31 MED ORDER — CLINDAMYCIN PHOSPHATE 600 MG/50ML IV SOLN
600.0000 mg | Freq: Four times a day (QID) | INTRAVENOUS | Status: AC
  Administered 2018-01-31 (×2): 600 mg via INTRAVENOUS
  Filled 2018-01-31 (×2): qty 50

## 2018-01-31 MED ORDER — SODIUM CHLORIDE 0.9 % IV SOLN
INTRAVENOUS | Status: DC
  Administered 2018-01-31: 15:00:00 via INTRAVENOUS

## 2018-01-31 MED ORDER — METHOCARBAMOL 500 MG PO TABS
500.0000 mg | ORAL_TABLET | Freq: Four times a day (QID) | ORAL | Status: DC | PRN
  Administered 2018-02-01 (×2): 500 mg via ORAL
  Filled 2018-01-31 (×2): qty 1

## 2018-01-31 MED ORDER — ASPIRIN 81 MG PO CHEW
81.0000 mg | CHEWABLE_TABLET | Freq: Two times a day (BID) | ORAL | Status: DC
  Administered 2018-01-31 – 2018-02-01 (×2): 81 mg via ORAL
  Filled 2018-01-31 (×2): qty 1

## 2018-01-31 MED ORDER — GABAPENTIN 300 MG PO CAPS
600.0000 mg | ORAL_CAPSULE | Freq: Three times a day (TID) | ORAL | Status: DC
  Administered 2018-01-31 – 2018-02-01 (×2): 600 mg via ORAL
  Filled 2018-01-31: qty 2

## 2018-01-31 MED ORDER — SODIUM CHLORIDE 0.9 % IJ SOLN
INTRAMUSCULAR | Status: DC | PRN
  Administered 2018-01-31: 20 mL

## 2018-01-31 MED ORDER — PHENOL 1.4 % MT LIQD: 1.0000 | OROMUCOSAL | Status: DC | PRN

## 2018-01-31 MED ORDER — ONDANSETRON HCL 4 MG/2ML IJ SOLN
4.0000 mg | Freq: Four times a day (QID) | INTRAMUSCULAR | Status: DC | PRN
  Administered 2018-01-31: 4 mg via INTRAVENOUS
  Filled 2018-01-31: qty 2

## 2018-01-31 MED ORDER — LABETALOL HCL 5 MG/ML IV SOLN
INTRAVENOUS | Status: DC | PRN
  Administered 2018-01-31: 5 mg via INTRAVENOUS

## 2018-01-31 MED ORDER — HYDROMORPHONE HCL 1 MG/ML IJ SOLN
INTRAMUSCULAR | Status: DC | PRN
  Administered 2018-01-31 (×2): 1 mg via INTRAVENOUS

## 2018-01-31 MED ORDER — PROPOFOL 10 MG/ML IV BOLUS
INTRAVENOUS | Status: AC
  Filled 2018-01-31: qty 40

## 2018-01-31 MED ORDER — FENTANYL CITRATE (PF) 100 MCG/2ML IJ SOLN
INTRAMUSCULAR | Status: AC
  Filled 2018-01-31: qty 4

## 2018-01-31 MED ORDER — METOCLOPRAMIDE HCL 5 MG/ML IJ SOLN
5.0000 mg | Freq: Three times a day (TID) | INTRAMUSCULAR | Status: DC | PRN
  Administered 2018-01-31: 5 mg via INTRAVENOUS

## 2018-01-31 MED ORDER — HYDROMORPHONE HCL 2 MG/ML IJ SOLN
INTRAMUSCULAR | Status: AC
  Filled 2018-01-31: qty 1

## 2018-01-31 MED ORDER — POLYETHYLENE GLYCOL 3350 17 G PO PACK: 17.0000 g | PACK | Freq: Every day | ORAL | Status: DC | PRN

## 2018-01-31 MED ORDER — METOCLOPRAMIDE HCL 5 MG/ML IJ SOLN
INTRAMUSCULAR | Status: AC
  Filled 2018-01-31: qty 2

## 2018-01-31 MED ORDER — VITAMIN D 1000 UNITS PO TABS
1000.0000 [IU] | ORAL_TABLET | Freq: Every day | ORAL | Status: DC
  Administered 2018-02-01: 1000 [IU] via ORAL
  Filled 2018-01-31: qty 1

## 2018-01-31 MED ORDER — ATORVASTATIN CALCIUM 20 MG PO TABS: 20.0000 mg | ORAL_TABLET | Freq: Every day | ORAL | Status: DC

## 2018-01-31 MED ORDER — LABETALOL HCL 5 MG/ML IV SOLN
INTRAVENOUS | Status: AC
  Filled 2018-01-31: qty 4

## 2018-01-31 MED ORDER — MENTHOL 3 MG MT LOZG: 1.0000 | LOZENGE | OROMUCOSAL | Status: DC | PRN

## 2018-01-31 MED ORDER — CHLORHEXIDINE GLUCONATE 4 % EX LIQD: 60.0000 mL | Freq: Once | CUTANEOUS | Status: DC

## 2018-01-31 MED ORDER — HYDROMORPHONE HCL 2 MG PO TABS
2.0000 mg | ORAL_TABLET | ORAL | Status: DC | PRN
  Administered 2018-02-01 (×3): 2 mg via ORAL
  Filled 2018-01-31 (×3): qty 1

## 2018-01-31 MED ORDER — ONDANSETRON HCL 4 MG/2ML IJ SOLN
INTRAMUSCULAR | Status: DC | PRN
  Administered 2018-01-31: 4 mg via INTRAVENOUS

## 2018-01-31 MED ORDER — PANTOPRAZOLE SODIUM 40 MG PO TBEC
40.0000 mg | DELAYED_RELEASE_TABLET | Freq: Every day | ORAL | Status: DC
  Administered 2018-01-31 – 2018-02-01 (×2): 40 mg via ORAL
  Filled 2018-01-31 (×2): qty 1

## 2018-01-31 MED ORDER — SODIUM CHLORIDE 0.9 % IR SOLN
Status: DC | PRN
  Administered 2018-01-31 (×2): 1000 mL

## 2018-01-31 MED ORDER — BUPIVACAINE IN DEXTROSE 0.75-8.25 % IT SOLN
INTRATHECAL | Status: DC | PRN
  Administered 2018-01-31: 1.8 mL via INTRATHECAL

## 2018-01-31 SURGICAL SUPPLY — 57 items
APL SKNCLS STERI-STRIP NONHPOA (GAUZE/BANDAGES/DRESSINGS)
BAG SPEC THK2 15X12 ZIP CLS (MISCELLANEOUS)
BAG ZIPLOCK 12X15 (MISCELLANEOUS) IMPLANT
BANDAGE ACE 6X5 VEL STRL LF (GAUZE/BANDAGES/DRESSINGS) ×2 IMPLANT
BANDAGE ELASTIC 6 VELCRO ST LF (GAUZE/BANDAGES/DRESSINGS) ×2 IMPLANT
BASEPLATE TIBIAL TRIATHALON 3 (Plate) ×1 IMPLANT
BEARIN INSERT TIBIAL SZ 3 11 (Insert) ×2 IMPLANT
BEARING INSERT TIBIAL SZ 3 11 (Insert) IMPLANT
BENZOIN TINCTURE PRP APPL 2/3 (GAUZE/BANDAGES/DRESSINGS) IMPLANT
BLADE SAG 18X100X1.27 (BLADE) ×1 IMPLANT
BOWL SMART MIX CTS (DISPOSABLE) ×1 IMPLANT
BSPLAT TIB 3 CMNT PRM STRL KN (Plate) ×1 IMPLANT
CEMENT BONE SIMPLEX SPEEDSET (Cement) ×2 IMPLANT
COVER SURGICAL LIGHT HANDLE (MISCELLANEOUS) ×2 IMPLANT
CUFF TOURN SGL QUICK 34 (TOURNIQUET CUFF) ×2
CUFF TRNQT CYL 34X4X40X1 (TOURNIQUET CUFF) ×1 IMPLANT
DECANTER SPIKE VIAL GLASS SM (MISCELLANEOUS) IMPLANT
DRAPE U-SHAPE 47X51 STRL (DRAPES) ×2 IMPLANT
DRSG PAD ABDOMINAL 8X10 ST (GAUZE/BANDAGES/DRESSINGS) ×2 IMPLANT
DURAPREP 26ML APPLICATOR (WOUND CARE) ×2 IMPLANT
ELECT REM PT RETURN 15FT ADLT (MISCELLANEOUS) ×2 IMPLANT
FEMORAL PEG DISTAL FIXATION (Orthopedic Implant) ×1 IMPLANT
FEMORAL TRIATH POST STAB SZ 4 (Orthopedic Implant) ×1 IMPLANT
GAUZE SPONGE 4X4 12PLY STRL (GAUZE/BANDAGES/DRESSINGS) ×2 IMPLANT
GAUZE XEROFORM 1X8 LF (GAUZE/BANDAGES/DRESSINGS) ×1 IMPLANT
GLOVE BIO SURGEON STRL SZ7.5 (GLOVE) ×2 IMPLANT
GLOVE BIOGEL PI IND STRL 8 (GLOVE) ×2 IMPLANT
GLOVE BIOGEL PI INDICATOR 8 (GLOVE) ×2
GLOVE ECLIPSE 8.0 STRL XLNG CF (GLOVE) ×2 IMPLANT
GOWN STRL REUS W/TWL XL LVL3 (GOWN DISPOSABLE) ×4 IMPLANT
HANDPIECE INTERPULSE COAX TIP (DISPOSABLE) ×2
HOLDER FOLEY CATH W/STRAP (MISCELLANEOUS) ×1 IMPLANT
IMMOBILIZER KNEE 20 (SOFTGOODS) ×2
IMMOBILIZER KNEE 20 THIGH 36 (SOFTGOODS) ×1 IMPLANT
NDL SAFETY ECLIPSE 18X1.5 (NEEDLE) IMPLANT
NEEDLE HYPO 18GX1.5 SHARP (NEEDLE)
NS IRRIG 1000ML POUR BTL (IV SOLUTION) ×2 IMPLANT
PACK TOTAL KNEE CUSTOM (KITS) ×2 IMPLANT
PADDING CAST COTTON 6X4 STRL (CAST SUPPLIES) ×4 IMPLANT
PATELLA TRIATHLON SZ 29 9 MM (Orthopedic Implant) ×1 IMPLANT
POSITIONER SURGICAL ARM (MISCELLANEOUS) ×2 IMPLANT
SET HNDPC FAN SPRY TIP SCT (DISPOSABLE) ×1 IMPLANT
SET PAD KNEE POSITIONER (MISCELLANEOUS) ×2 IMPLANT
STAPLER VISISTAT 35W (STAPLE) ×1 IMPLANT
STRIP CLOSURE SKIN 1/2X4 (GAUZE/BANDAGES/DRESSINGS) IMPLANT
SUT MNCRL AB 4-0 PS2 18 (SUTURE) IMPLANT
SUT VIC AB 0 CT1 27 (SUTURE) ×2
SUT VIC AB 0 CT1 27XBRD ANTBC (SUTURE) ×1 IMPLANT
SUT VIC AB 1 CT1 36 (SUTURE) ×4 IMPLANT
SUT VIC AB 2-0 CT1 27 (SUTURE) ×6
SUT VIC AB 2-0 CT1 TAPERPNT 27 (SUTURE) ×2 IMPLANT
SYR 3ML LL SCALE MARK (SYRINGE) IMPLANT
TRAY FOLEY MTR SLVR 16FR STAT (SET/KITS/TRAYS/PACK) ×1 IMPLANT
TRAY FOLEY W/BAG SLVR 14FR (SET/KITS/TRAYS/PACK) ×1 IMPLANT
WATER STERILE IRR 1000ML POUR (IV SOLUTION) ×3 IMPLANT
WRAP KNEE MAXI GEL POST OP (GAUZE/BANDAGES/DRESSINGS) ×2 IMPLANT
YANKAUER SUCT BULB TIP 10FT TU (MISCELLANEOUS) ×2 IMPLANT

## 2018-01-31 NOTE — Op Note (Signed)
NAME: Yesenia Jones, Yesenia Jones MEDICAL RECORD [id number] ACCOUNT [id number] DATE OF BIRTH:1945-11-24 FACILITY: [facility] LOCATION: [facility] PHYSICIAN:Rhilyn Battle Aretha Parrot, MD  OPERATIVE REPORT  DATE OF PROCEDURE:  01/31/2018  PREOPERATIVE DIAGNOSIS:  Severe primary osteoarthritis and degenerative joint disease, right knee.  POSTOPERATIVE DIAGNOSIS:  Severe primary osteoarthritis and degenerative joint disease, right knee.  PROCEDURE:  Right total knee arthroplasty.  IMPLANTS:  Stryker Triathlon cemented knee system with size 4 femur, size 3 tibial tray, 11 millimeter fixed bearing polyethylene insert, size 29 patellar button.  SURGEON:  Vanita Panda. Magnus Ivan, MD  ASSISTANT:  Salli Real, PA-C.  ANESTHESIA: 1.  Right lower extremity regional block. 2.  Spinal. 3.  Infiltration of the joint capsule with Exparel and saline.  ANTIBIOTICS:  900 mg IV clindamycin.  ESTIMATED BLOOD LOSS:  200 mL  TOURNIQUET TIME:  Less than one hour.  COMPLICATIONS:  None.  INDICATIONS:  The patient is a 72 year old female well known to me.  She has debilitating arthritis involving her right knee.  Her pain is daily and it has detrimentally affected her activities of daily living, her quality of life and her mobility.  Her  x-rays show well documented end-stage arthritis of the knee with slight varus malalignment and complete loss of medial joint space.  At this point, given her daily pain and the failure of conservative treatment measures as well as the detrimental impact  that her knee pain has had on her quality of life and mobility, she does wish to proceed with a total knee arthroplasty.  She has had this done before remotely on her left side.  She understands and is fully aware of the risk of acute blood loss anemia,  nerve or vessel injury, fracture, infection, DVT and implant failure.  She understands our goals are to decrease pain, improve mobility and overall improve quality of  life.  DESCRIPTION OF PROCEDURE:  After informed consent was obtained and appropriate right knee was marked.  Anesthesia obtained a regional block in the holding room.  She was brought to the operating room and sat up on the operating table.  Spinal anesthesia  was obtained.  She was then laid in supine position.  A Foley catheter was placed and a nonsterile tourniquet was placed around the upper right thigh and her right thigh, knee, leg and ankle were prepped and draped with DuraPrep and sterile drapes.  A  time-out was called to identify correct patient, correct right knee.  The bed was raised and an Esmarch was used to wrap that leg and tourniquet was inflated to 300 mm of pressure.  I then made a direct midline incision over the patella and carried this  proximally and distally.  We dissected down the knee joint and carried out a medial parapatellar arthrotomy finding significant and severe arthritis throughout her right knee.  We removed inflamed synovium from the knee as well as periarticular  osteophytes.  With the knee in a flexed position, we set our extramedullary cutting guide for taking 9 mm off the high side, correcting varus and valgus and neutral slope.  We made this cut without difficulty.  We then went to the femur, made an  intramedullary drill hole for setting our distal femoral cutting guide.  This was for right knee at 5 degrees externally rotated for an 8 mm distal femoral cut.  We made this cut without difficulty and went back down to full extension and with a 9 and  then 11 mm extension block we achieved full  extension.  We then went back to the femur and put our femoral sizing guide based off the epicondylar axis.  We chose a size 4 femur based off of this.  We placed our 4-in-1 cutting block for size a 4 femur and  made our anterior and posterior cuts followed by our chamfer cuts.  We then made our femoral box cut.  Attention was then turned back to the tibia.  With a tibial tray,  we felt a 3 gave better coverage.  We set the rotation based on the femur and the  tibial tubercle.  We made our keel punch off of this.  With a size 3 trial tibia followed by the 4 trial femur, we trialed an 11 millimeter fixed bearing polyethylene insert and I was pleased with her stability and range of motion with that insert.  We  then cut our patella and drilled 3 holes for a size 29 patellar button.  We then removed all trial instrumentation from the knee.  We irrigated the knee with normal saline solution using pulsatile lavage and then dried the knee.  We mixed our cement and  then placed first a mixture of Exparel and saline around the joint capsule.  We then dried the knee.  With the knee in a flexed position, cemented our real Stryker Triathlon tibial tray size 3 followed by real size 4 right femur.  We removed cement  debris from the knee and then placed our 11 millimeter fixed bearing polyethylene insert and cemented our patellar button.  Once the cement was hardened, we removed excess cement debris from the knee and then let the tourniquet down.  Hemostasis obtained  with electrocautery.  We closed the arthrotomy with interrupted 1 Vicryl suture followed by 0 Vicryl in the deep tissue, 2-0 Vicryl subcutaneous tissue and interrupted staples on the skin.  A well-padded sterile dressing was applied.  She was taken to  recovery room in stable condition.  All final counts were correct.  There were no complications noted.  Of note, Rexene Edison, PA-C, assisted the entire case.  His assistance was crucial for facilitating all aspects of this case.  TN/NUANCE  D:01/31/2018 T:01/31/2018 JOB:[id number]

## 2018-01-31 NOTE — Evaluation (Signed)
Physical Therapy Evaluation Patient Details Name: Yesenia Jones MRN: [id number] DOB: 1945-11-30 Today's Date: 01/31/2018   History of Present Illness  Pt is a 72 YO female s/p R TKR on 9/20. PMH includes OA, hearing loss, plantar facsiitis, tinnitus, HLD, OP, other knee surgery.   Clinical Impression  Pt s/p R TKR. Pt presents with difficulty performing mobility tasks mostly secondary to nausea, and decreased tolerance for activity. Acute PT to address these deficits. Pt able to perform pivot to chair, but was limited this session by nausea. Pt expected to mobilize well with PT tomorrow. Pt with low BP in supine (95/69), but increased with sitting EOB (102/83). PT to continue to progress mobility as able.    Follow Up Recommendations Follow surgeon's recommendation for DC plan and follow-up therapies;Supervision for mobility/OOB(HHPT)    Equipment Recommendations  None recommended by PT    Recommendations for Other Services       Precautions / Restrictions Precautions Precautions: Fall;Knee Restrictions Weight Bearing Restrictions: No Other Position/Activity Restrictions: WBAT       Mobility  Bed Mobility Overal bed mobility: Needs Assistance Bed Mobility: Supine to Sit     Supine to sit: Min assist;Min guard     General bed mobility comments: Pt with nausea in supine, but pt states she wants to mobilize at least to EOB. Min assist for RLE management to get to EOB. Pt min guard for trunk management and scooting   Transfers Overall transfer level: Needs assistance Equipment used: Rolling walker (2 wheeled) Transfers: Sit to/from [employer] Sit to Stand: Min guard Stand pivot transfers: Min guard       General transfer comment: Pt still nauseous, but wanted to stand and get to recliner. Pt not orthostatic. Min guard for safety, verbal cuing for hand placement. Min guard to stand pivot to chair for safety.   Ambulation/Gait                 Stairs            Wheelchair Mobility    Modified Rankin (Stroke Patients Only)       Balance Overall balance assessment: Mild deficits observed, not formally tested                                           Pertinent Vitals/Pain Pain Assessment: No/denies pain    Home Living Family/patient expects to be discharged to:: Private residence Living Arrangements: Spouse/significant other Available Help at Discharge: Family;Available PRN/intermittently Type of Home: House Home Access: Stairs to enter Entrance Stairs-Rails: Lawyer of Steps: 6 Home Layout: One level Home Equipment: Bedside commode;Tub bench;Walker - 2 wheels;Walker - standard;Cane - quad      Prior Function Level of Independence: Independent               Hand Dominance   Dominant Hand: Right    Extremity/Trunk Assessment   Upper Extremity Assessment Upper Extremity Assessment: Overall WFL for tasks assessed    Lower Extremity Assessment Lower Extremity Assessment: Overall WFL for tasks assessed;RLE deficits/detail RLE Deficits / Details: suspected post-surgical RLE weakness; able to perform quad sets x5, short arc quad at EOB, ankle pumps  RLE Sensation: WNL    Cervical / Trunk Assessment Cervical / Trunk Assessment: Normal  Communication   Communication: No difficulties  Cognition Arousal/Alertness: Lethargic;Suspect due to medications Behavior During Therapy: [facility] for  tasks assessed/performed Overall Cognitive Status: Within Functional Limits for tasks assessed                                        General Comments      Exercises Total Joint Exercises Ankle Circles/Pumps: AROM;Both;5 reps;Seated Quad Sets: AROM;Right;5 reps;Seated Goniometric ROM: knee AAROM approximately 5-90*   Assessment/Plan    PT Assessment Patient needs continued PT services  PT Problem List Decreased strength;Pain;Decreased range of  motion;Decreased activity tolerance;Decreased knowledge of use of DME;Decreased balance;Decreased safety awareness;Decreased mobility       PT Treatment Interventions DME instruction;Therapeutic activities;Gait training;Therapeutic exercise;Patient/family education;Stair training;Balance training;Functional mobility training    PT Goals (Current goals can be found in the Care Plan section)  Acute Rehab PT Goals PT Goal Formulation: With patient Time For Goal Achievement: 02/14/18 Potential to Achieve Goals: Good    Frequency 7X/week   Barriers to discharge        Co-evaluation               AM-PAC PT "6 Clicks" Daily Activity  Outcome Measure Difficulty turning over in bed (including adjusting bedclothes, sheets and blankets)?: Unable Difficulty moving from lying on back to sitting on the side of the bed? : Unable Difficulty sitting down on and standing up from a chair with arms (e.g., wheelchair, bedside commode, etc,.)?: Unable Help needed moving to and from a bed to chair (including a wheelchair)?: A Little Help needed walking in hospital room?: A Little Help needed climbing 3-5 steps with a railing? : A Little 6 Click Score: 12    End of Session Equipment Utilized During Treatment: Gait belt Activity Tolerance: Patient limited by fatigue;Treatment limited secondary to medical complications (Comment)(nausea) Patient left: in chair;with chair alarm set;with call bell/phone within reach;with family/visitor present;with SCD's reapplied Nurse Communication: Mobility status PT Visit Diagnosis: Other abnormalities of gait and mobility (R26.89);Difficulty in walking, not elsewhere classified (R26.2)    Time: 1610-9604 PT Time Calculation (min) (ACUTE ONLY): 24 min   Charges:   PT Evaluation $PT Eval Low Complexity: 1 Low PT Treatments $Therapeutic Activity: 8-22 mins        Lauria Depoy Terrial Rhodes, PT Acute Rehabilitation Services Pager [phone]  Office  [phone]   Child Campoy D Despina Hidden 01/31/2018, 6:15 PM

## 2018-01-31 NOTE — Anesthesia Procedure Notes (Addendum)
Anesthesia Regional Block: Adductor canal block   Pre-Anesthetic Checklist: ,, timeout performed, Correct Patient, Correct Site, Correct Laterality, Correct Procedure,, site marked, risks and benefits discussed, Surgical consent,  Pre-op evaluation,  At surgeon's request and post-op pain management  Laterality: Right  Prep: chloraprep       Needles:  Injection technique: Single-shot  Needle Type: Echogenic Stimulator Needle     Needle Length: 9cm  Needle Gauge: 21     Additional Needles:   Procedures:,,,, ultrasound used (permanent image in chart),,,,  Narrative:  Start time: 01/31/2018 8:25 AM End time: 01/31/2018 8:40 AM Injection made incrementally with aspirations every 5 mL.  Performed by: Personally  Anesthesiologist: Leonides Grills, MD  Additional Notes: Functioning IV was confirmed and monitors were applied. A time-out was performed. Hand hygiene and sterile gloves were used. The hip was externally rotated and the thigh was prepped in a sterile fashion. A 1mm 21ga Arrow echogenic stimulator needle was placed using ultrasound guidance.  Negative aspiration and negative test dose prior to incremental administration of local anesthetic. The hip was then internally rotated and additional local anesthetic was infiltrated between the popliteal artery and the capsule of the knee using ultrasound guidance.  The patient tolerated the procedure well.

## 2018-01-31 NOTE — Care Management Note (Signed)
Case Management Note  Patient Details  Name: Yesenia Jones MRN: [id number] Date of Birth: April 24, 1946  Subjective/Objective:     Spoke with patient and spouse at bedside. Plan is for HHPT. No preference for [facility] agency.  Has DME.              Action/Plan: Contacted [facility] for the referral, they have accepted. Anticipate discharge in am.  Expected Discharge Date:                  Expected Discharge Plan:  Home w Home Health Services  In-House Referral:  NA  Discharge planning Services  CM Consult  Post Acute Care Choice:  Home Health Choice offered to:  Patient, Spouse  DME Arranged:  N/A DME Agency:  NA  HH Arranged:  PT HH Agency:  [facility]  Status of Service:  Completed, signed off  If discussed at Long Length of Stay Meetings, dates discussed:    Additional Comments:  Alexis Goodell, RN 01/31/2018, 2:35 PM

## 2018-01-31 NOTE — Anesthesia Procedure Notes (Signed)
Date/Time: 01/31/2018 8:56 AM Performed by: Florene Route, CRNA Oxygen Delivery Method: Simple face mask

## 2018-01-31 NOTE — Anesthesia Procedure Notes (Signed)
Spinal  Patient location during procedure: OR Start time: 01/31/2018 9:00 AM End time: 01/31/2018 9:10 AM Staffing Anesthesiologist: Leonides Grills, MD Performed: anesthesiologist  Preanesthetic Checklist Completed: patient identified, surgical consent, pre-op evaluation, timeout performed, IV checked, risks and benefits discussed and monitors and equipment checked Spinal Block Patient position: sitting Prep: DuraPrep Patient monitoring: cardiac monitor, continuous pulse ox and blood pressure Approach: midline Location: L4-5 Injection technique: single-shot Needle Needle type: Pencan  Needle gauge: 24 G Needle length: 9 cm Assessment Sensory level: T10 Additional Notes Functioning IV was confirmed and monitors were applied. Sterile prep and drape, including hand hygiene and sterile gloves were used. The patient was positioned and the spine was prepped. Previous attempt by CRNA. The skin was anesthetized with lidocaine.  Free flow of clear CSF was obtained prior to injecting local anesthetic into the CSF.  The spinal needle aspirated freely following injection.  The needle was carefully withdrawn.  The patient tolerated the procedure well.

## 2018-01-31 NOTE — Progress Notes (Signed)
AssistedDr. Bradley Ferris with right, ultrasound guided, adductor canal block and IPAC.Marland Kitchen Side rails up, monitors on throughout procedure. See vital signs in flow sheet. Tolerated Procedure well.

## 2018-01-31 NOTE — Transfer of Care (Signed)
Immediate Anesthesia Transfer of Care Note  Patient: Yesenia Jones  Procedure(s) Performed: RIGHT TOTAL KNEE ARTHROPLASTY (Right Knee)  Patient Location: PACU  Anesthesia Type:Spinal  Level of Consciousness: awake, alert  and oriented  Airway & Oxygen Therapy: Patient Spontanous Breathing and Patient connected to face mask oxygen  Post-op Assessment: Report given to RN and Post -op Vital signs reviewed and stable  Post vital signs: Reviewed and stable  Last Vitals:  Vitals Value Taken Time  BP 98/63 01/31/2018 10:56 AM  Temp    Pulse 65 01/31/2018 10:58 AM  Resp 14 01/31/2018 10:58 AM  SpO2 100 % 01/31/2018 10:58 AM  Vitals shown include unvalidated device data.  Last Pain:  Vitals:   01/31/18 0835  TempSrc:   PainSc: 0-No pain         Complications: No apparent anesthesia complications

## 2018-01-31 NOTE — Brief Op Note (Signed)
01/31/2018  10:29 AM  PATIENT:  Yesenia Jones  72 y.o. female  PRE-OPERATIVE DIAGNOSIS:  osteoarthritis right knee  POST-OPERATIVE DIAGNOSIS:  osteoarthritis right knee  PROCEDURE:  Procedure(s): RIGHT TOTAL KNEE ARTHROPLASTY (Right)  SURGEON:  Surgeon(s) and Role:    Kathryne Hitch, MD - Primary  PHYSICIAN ASSISTANT: Rexene Edison, PA-C  ANESTHESIA:   local, regional and spinal  EBL:  100 mL   COUNTS:  YES  TOURNIQUET:   Total Tourniquet Time Documented: Thigh (Right) - 49 minutes Total: Thigh (Right) - 49 minutes   DICTATION: .Other Dictation: Dictation Number [id number]  PLAN OF CARE: Admit to inpatient   PATIENT DISPOSITION:  PACU - hemodynamically stable.   Delay start of Pharmacological VTE agent (>24hrs) due to surgical blood loss or risk of bleeding: no

## 2018-01-31 NOTE — H&P (Signed)
TOTAL KNEE ADMISSION H&P  Patient is being admitted for right total knee arthroplasty.  Subjective:  Chief Complaint:right knee pain.  HPI: Yesenia Jones, 72 y.o. female, has a history of pain and functional disability in the right knee due to arthritis and has failed non-surgical conservative treatments for greater than 12 weeks to includeNSAID's and/or analgesics, corticosteriod injections, viscosupplementation injections, flexibility and strengthening excercises, use of assistive devices and activity modification.  Onset of symptoms was gradual, starting 4 years ago with gradually worsening course since that time. The patient noted no past surgery on the right knee(s).  Patient currently rates pain in the right knee(s) at 10 out of 10 with activity. Patient has night pain, worsening of pain with activity and weight bearing, pain that interferes with activities of daily living, pain with passive range of motion, crepitus and joint swelling.  Patient has evidence of subchondral sclerosis, periarticular osteophytes and joint space narrowing by imaging studies. There is no active infection.  Patient Active Problem List   Diagnosis Date Noted  . Unilateral primary osteoarthritis, right knee 01/14/2018  . Osteoarthritis of right knee 12/09/2017  . Plantar fasciitis 02/07/2016  . Prediabetes 06/17/2015  . Counseling regarding end of life decision making 05/28/2014  . Hearing loss 04/04/2012  . Tinnitus 04/04/2012  . Hyperlipidemia 04/14/2010  . MORTON'S NEUROMA 04/14/2010  . TALIPES CAVUS 04/14/2010  . Osteoporosis 03/17/2010  . ALLERGIC RHINITIS 08/25/2008  . Osteoarthrosis, unspecified whether generalized or localized, involving lower leg 07/21/2007   Past Medical History:  Diagnosis Date  . Arthritis   . Hyperlipidemia     Past Surgical History:  Procedure Laterality Date  . CHOLECYSTECTOMY    . KNEE SURGERY    . TUBAL LIGATION      Current Facility-Administered Medications   Medication Dose Route Frequency Provider Last Rate Last Dose  . chlorhexidine (HIBICLENS) 4 % liquid 4 application  60 mL Topical Once Richardean Canal W, PA-C      . clindamycin (CLEOCIN) IVPB 900 mg  900 mg Intravenous On Call to OR Kirtland Bouchard, PA-C      . fentaNYL (SUBLIMAZE) injection 100 mcg  100 mcg Intravenous Once Heather Roberts, MD      . lactated ringers infusion   Intravenous Continuous Heather Roberts, MD      . midazolam (VERSED) injection 2 mg  2 mg Intravenous Once Heather Roberts, MD      . tranexamic acid (CYKLOKAPRON) 1,000 mg in sodium chloride 0.9 % 100 mL IVPB  1,000 mg Intravenous To OR Kirtland Bouchard, PA-C       Allergies  Allergen Reactions  . Hydrocodone-Acetaminophen Nausea And Vomiting  . Oxycodone-Acetaminophen Nausea And Vomiting  . Penicillins Rash    Has patient had a PCN reaction causing immediate rash, facial/tongue/throat swelling, SOB or lightheadedness with hypotension: Yes Has patient had a PCN reaction causing severe rash involving mucus membranes or skin necrosis: No Has patient had a PCN reaction that required hospitalization: No Has patient had a PCN reaction occurring within the last 10 years: No If all of the above answers are "NO", then may proceed with Cephalosporin use.     Social History   Tobacco Use  . Smoking status: Never Smoker  . Smokeless tobacco: Never Used  Substance Use Topics  . Alcohol use: Yes    Alcohol/week: 3.0 standard drinks    Types: 3 Glasses of wine per week    Comment: 1 beer yesterday afternoon 01/30/2018    Family  History  Problem Relation Age of Onset  . Stroke Mother   . Hyperlipidemia Mother   . Hyperlipidemia Father   . Cancer Maternal Aunt        breast cancer     Review of Systems  Musculoskeletal: Positive for joint pain.  All other systems reviewed and are negative.   Objective:  Physical Exam  Constitutional: She is oriented to person, place, and time. She appears well-developed and  well-nourished.  HENT:  Head: Normocephalic and atraumatic.  Eyes: Pupils are equal, round, and reactive to light. EOM are normal.  Neck: Normal range of motion. Neck supple.  Cardiovascular: Normal rate.  Respiratory: Effort normal and breath sounds normal.  GI: Soft. Bowel sounds are normal.  Musculoskeletal:       Right knee: She exhibits decreased range of motion, swelling, effusion and abnormal alignment. Tenderness found. Medial joint line and lateral joint line tenderness noted.  Neurological: She is alert and oriented to person, place, and time.  Skin: Skin is warm and dry.  Psychiatric: She has a normal mood and affect.    Vital signs in last 24 hours:    Labs:   Estimated body mass index is 21.73 kg/m as calculated from the following:   Height as of 01/23/18: 5\' 5"  (1.651 m).   Weight as of 01/23/18: 59.2 kg.   Imaging Review Plain radiographs demonstrate severe degenerative joint disease of the right knee(s). The overall alignment ismild varus. The bone quality appears to be good for age and reported activity level.   Preoperative templating of the joint replacement has been completed, documented, and submitted to the Operating Room personnel in order to optimize intra-operative equipment management.    Patient's anticipated LOS is less than 2 midnights, meeting these requirements: - Younger than 86 - Lives within 1 hour of care - Has a competent adult at home to recover with post-op recover - NO history of  - Chronic pain requiring opiods  - Diabetes  - Coronary Artery Disease  - Heart failure  - Heart attack  - Stroke  - DVT/VTE  - Cardiac arrhythmia  - Respiratory Failure/COPD  - Renal failure  - Anemia  - Advanced Liver disease        Assessment/Plan:  End stage arthritis, right knee   The patient history, physical examination, clinical judgment of the provider and imaging studies are consistent with end stage degenerative joint disease of  the right knee(s) and total knee arthroplasty is deemed medically necessary. The treatment options including medical management, injection therapy arthroscopy and arthroplasty were discussed at length. The risks and benefits of total knee arthroplasty were presented and reviewed. The risks due to aseptic loosening, infection, stiffness, patella tracking problems, thromboembolic complications and other imponderables were discussed. The patient acknowledged the explanation, agreed to proceed with the plan and consent was signed. Patient is being admitted for inpatient treatment for surgery, pain control, PT, OT, prophylactic antibiotics, VTE prophylaxis, progressive ambulation and ADL's and discharge planning. The patient is planning to be discharged home with home health services

## 2018-01-31 NOTE — Anesthesia Postprocedure Evaluation (Signed)
Anesthesia Post Note  Patient: Yesenia Jones  Procedure(s) Performed: RIGHT TOTAL KNEE ARTHROPLASTY (Right Knee)     Patient location during evaluation: PACU Anesthesia Type: Regional and Spinal Level of consciousness: oriented and awake and alert Pain management: pain level controlled Vital Signs Assessment: post-procedure vital signs reviewed and stable Respiratory status: spontaneous breathing, respiratory function stable and patient connected to nasal cannula oxygen Cardiovascular status: blood pressure returned to baseline and stable Postop Assessment: no headache, no backache, no apparent nausea or vomiting and spinal receding Anesthetic complications: no    Last Vitals:  Vitals:   01/31/18 1504 01/31/18 1604  BP: 99/73 97/71  Pulse: 61 60  Resp: 14 16  Temp: 36.4 C   SpO2: 99% 100%    Last Pain:  Vitals:   01/31/18 1504  TempSrc: Oral  PainSc:                  Maston Wight P Charvis Lightner

## 2018-01-31 NOTE — Anesthesia Preprocedure Evaluation (Addendum)
Anesthesia Evaluation  Patient identified by MRN, date of birth, ID band Patient awake    Reviewed: Allergy & Precautions, NPO status , Patient's Chart, lab work & pertinent test results  Airway Mallampati: II  TM Distance: >3 FB Neck ROM: Full    Dental no notable dental hx.    Pulmonary neg pulmonary ROS,    Pulmonary exam normal breath sounds clear to auscultation       Cardiovascular Exercise Tolerance: Good negative cardio ROS Normal cardiovascular exam Rhythm:Regular Rate:Normal     Neuro/Psych negative neurological ROS  negative psych ROS   GI/Hepatic negative GI ROS, Neg liver ROS,   Endo/Other  negative endocrine ROS  Renal/GU negative Renal ROS     Musculoskeletal  (+) Arthritis ,   Abdominal   Peds  Hematology HLD   Anesthesia Other Findings osteoarthritis right knee  Reproductive/Obstetrics                            Anesthesia Physical Anesthesia Plan  ASA: II  Anesthesia Plan: Spinal and Regional   Post-op Pain Management:    Induction: Intravenous  PONV Risk Score and Plan: 2 and Ondansetron, Dexamethasone, Midazolam and Treatment may vary due to age or medical condition  Airway Management Planned: Natural Airway  Additional Equipment:   Intra-op Plan:   Post-operative Plan:   Informed Consent: I have reviewed the patients History and Physical, chart, labs and discussed the procedure including the risks, benefits and alternatives for the proposed anesthesia with the patient or authorized representative who has indicated his/her understanding and acceptance.   Dental advisory given  Plan Discussed with: CRNA  Anesthesia Plan Comments:         Anesthesia Quick Evaluation

## 2018-02-01 DIAGNOSIS — M1711 Unilateral primary osteoarthritis, right knee: Secondary | ICD-10-CM | POA: Diagnosis not present

## 2018-02-01 LAB — BASIC METABOLIC PANEL
ANION GAP: 8 (ref 5–15)
BUN: 12 mg/dL (ref 8–23)
CHLORIDE: 108 mmol/L (ref 98–111)
CO2: 25 mmol/L (ref 22–32)
Calcium: 8.5 mg/dL — ABNORMAL LOW (ref 8.9–10.3)
Creatinine, Ser: 0.68 mg/dL (ref 0.44–1.00)
GFR calc non Af Amer: >60 mL/min (ref >60)
Glucose, Bld: 116 mg/dL — ABNORMAL HIGH (ref 70–99)
POTASSIUM: 4.9 mmol/L (ref 3.5–5.1)
SODIUM: 141 mmol/L (ref 135–145)

## 2018-02-01 LAB — CBC
HCT: 30.1 % — ABNORMAL LOW (ref 36.0–46.0)
HEMOGLOBIN: 10 g/dL — AB (ref 12.0–15.0)
MCH: 30.9 pg (ref 26.0–34.0)
MCHC: 33.2 g/dL (ref 30.0–36.0)
MCV: 92.9 fL (ref 78.0–100.0)
PLATELETS: 207 10*3/uL (ref 150–400)
RBC: 3.24 MIL/uL — AB (ref 3.87–5.11)
RDW: 14.1 % (ref 11.5–15.5)
WBC: 11.3 10*3/uL — AB (ref 4.0–10.5)

## 2018-02-01 MED ORDER — ASPIRIN 81 MG PO CHEW: 81.0000 mg | CHEWABLE_TABLET | Freq: Two times a day (BID) | ORAL | 0 refills | Status: DC

## 2018-02-01 MED ORDER — ONDANSETRON 4 MG PO TBDP: 4.0000 mg | ORAL_TABLET | Freq: Three times a day (TID) | ORAL | 0 refills | Status: DC | PRN

## 2018-02-01 MED ORDER — METHOCARBAMOL 500 MG PO TABS: 500.0000 mg | ORAL_TABLET | Freq: Four times a day (QID) | ORAL | 0 refills | Status: DC | PRN

## 2018-02-01 MED ORDER — HYDROMORPHONE HCL 2 MG PO TABS: 2.0000 mg | ORAL_TABLET | ORAL | 0 refills | Status: DC | PRN

## 2018-02-01 NOTE — Progress Notes (Signed)
   02/01/18 1353  PT Visit Information  Last PT Received On 02/01/18--continues to progress well, exercise focused session; pt feels ready to d/c home   Assistance Needed +1  History of Present Illness Pt is a 72 YO female s/p R TKR on 9/20. PMH includes OA, hearing loss, plantar facsiitis, tinnitus, HLD, OP, other knee surgery.   Subjective Data  Patient Stated Goal home today  Precautions  Precautions Fall;Knee  Restrictions  Other Position/Activity Restrictions WBAT   Pain Assessment  Pain Assessment 0-10  Pain Score 4  Pain Location right knee with exercises  Pain Descriptors / Indicators Grimacing;Guarding;Sore  Pain Intervention(s) Limited activity within patient's tolerance;Monitored during session;Premedicated before session  Cognition  Arousal/Alertness Awake/alert  Behavior During Therapy [facility] for tasks assessed/performed  Overall Cognitive Status Within Functional Limits for tasks assessed  Total Joint Exercises  Ankle Circles/Pumps AROM;Both;10 reps  Quad Sets AROM;Both;10 reps  Goniometric ROM -6* to 65* AAROM sitting knee flexion  Short Arc Quad AROM;Right;10 reps  Heel Slides AAROM;Right;10 reps (instructed in use ofgait belt as leg lifter)  Hip ABduction/ADduction AROM;Right;10 reps  Straight Leg Raises AAROM;Right;AROM;10 reps  Knee Flexion AAROM;Right;5 reps;Seated  Towel Squeeze AROM;Both;10 reps  PT - End of Session  Activity Tolerance Patient tolerated treatment well  Patient left in bed;with call bell/phone within reach;with family/visitor present   PT - Assessment/Plan  PT Plan Current plan remains appropriate  PT Visit Diagnosis Other abnormalities of gait and mobility (R26.89);Difficulty in walking, not elsewhere classified (R26.2)  PT Frequency (ACUTE ONLY) 7X/week  Follow Up Recommendations Follow surgeon's recommendation for DC plan and follow-up therapies;Supervision for mobility/OOB  PT equipment None recommended by PT  AM-PAC PT "6 Clicks" Daily  Activity Outcome Measure  Difficulty turning over in bed (including adjusting bedclothes, sheets and blankets)? 2  Difficulty moving from lying on back to sitting on the side of the bed?  2  Difficulty sitting down on and standing up from a chair with arms (e.g., wheelchair, bedside commode, etc,.)? 3  Help needed moving to and from a bed to chair (including a wheelchair)? 3  Help needed walking in hospital room? 3  Help needed climbing 3-5 steps with a railing?  3  6 Click Score 16  Mobility G Code  CK  PT Goal Progression  Progress towards PT goals Progressing toward goals  Acute Rehab PT Goals  PT Goal Formulation With patient  Time For Goal Achievement 02/14/18  Potential to Achieve Goals Good  PT Time Calculation  PT Start Time (ACUTE ONLY) 1330  PT Stop Time (ACUTE ONLY) 1355  PT Time Calculation (min) (ACUTE ONLY) 25 min  PT General Charges  $$ ACUTE PT VISIT 1 Visit  PT Treatments  $Therapeutic Exercise 23-37 mins

## 2018-02-01 NOTE — Discharge Summary (Signed)
Patient ID: Yesenia Jones MRN: [id number] DOB/AGE: [birth year] 72 y.o.  Admit date: 01/31/2018 Discharge date: 02/01/2018  Admission Diagnoses:  Principal Problem:   Unilateral primary osteoarthritis, right knee Active Problems:   Status post total knee replacement, right   Discharge Diagnoses:  Same  Past Medical History:  Diagnosis Date  . Arthritis   . Hyperlipidemia     Surgeries: Procedure(s): RIGHT TOTAL KNEE ARTHROPLASTY on 01/31/2018   Consultants:   Discharged Condition: Improved  Hospital Course: RINOA GARRAMONE is an 72 y.o. female who was admitted 01/31/2018 for operative treatment ofUnilateral primary osteoarthritis, right knee. Patient has severe unremitting pain that affects sleep, daily activities, and work/hobbies. After pre-op clearance the patient was taken to the operating room on 01/31/2018 and underwent  Procedure(s): RIGHT TOTAL KNEE ARTHROPLASTY.    Patient was given perioperative antibiotics:  Anti-infectives (From admission, onward)   Start     Dose/Rate Route Frequency Ordered Stop   01/31/18 1500  clindamycin (CLEOCIN) IVPB 600 mg     600 mg 100 mL/hr over 30 Minutes Intravenous Every 6 hours 01/31/18 1409 01/31/18 2156   01/31/18 0700  clindamycin (CLEOCIN) IVPB 900 mg     900 mg 100 mL/hr over 30 Minutes Intravenous On call to O.R. 01/31/18 4098 01/31/18 1191       Patient was given sequential compression devices, early ambulation, and chemoprophylaxis to prevent DVT.  Patient benefited maximally from hospital stay and there were no complications.    Recent vital signs:  Patient Vitals for the past 24 hrs:  BP Temp Temp src Pulse Resp SpO2 Height Weight  02/01/18 1010 123/75 98.6 F (37 C) Oral 78 17 98 % - -  02/01/18 0532 104/66 98.8 F (37.1 C) Oral 81 16 100 % - -  02/01/18 0137 103/65 98.2 F (36.8 C) Oral 72 16 99 % - -  01/31/18 2126 101/66 (!) 97.5 F (36.4 C) Oral 69 16 100 % - -  01/31/18 1723 110/76 97.6 F (36.4 C)  Axillary 61 16 100 % - -  01/31/18 1604 97/71 - - 60 16 100 % - -  01/31/18 1504 99/73 97.6 F (36.4 C) Oral 61 14 99 % - -  01/31/18 1400 100/70 (!) 97.2 F (36.2 C) Temporal 66 15 98 % 5\' 5"  (1.651 m) 59 kg  01/31/18 1330 110/73 (!) 97.3 F (36.3 C) - 67 (!) 23 97 % - -  01/31/18 1315 101/77 - - 67 19 91 % - -  01/31/18 1314 - 97.8 F (36.6 C) - 66 10 92 % - -  01/31/18 1300 92/72 (!) 96 F (35.6 C) - 64 10 92 % - -  01/31/18 1245 90/66 (!) 96.1 F (35.6 C) - 68 14 93 % - -  01/31/18 1230 103/67 - - 66 10 93 % - -  01/31/18 1215 103/69 - - 71 12 92 % - -  01/31/18 1200 103/70 - - 70 10 96 % - -  01/31/18 1145 91/66 - - 68 16 97 % - -  01/31/18 1130 99/68 - - 67 17 97 % - -     Recent laboratory studies:  Recent Labs    02/01/18 0350  WBC 11.3*  HGB 10.0*  HCT 30.1*  PLT 207  NA 141  K 4.9  CL 108  CO2 25  BUN 12  CREATININE 0.68  GLUCOSE 116*  CALCIUM 8.5*     Discharge Medications:   Allergies as of 02/01/2018  Reactions   Hydrocodone-acetaminophen Nausea And Vomiting   Oxycodone-acetaminophen Nausea And Vomiting   Penicillins Rash   Has patient had a PCN reaction causing immediate rash, facial/tongue/throat swelling, SOB or lightheadedness with hypotension: Yes Has patient had a PCN reaction causing severe rash involving mucus membranes or skin necrosis: No Has patient had a PCN reaction that required hospitalization: No Has patient had a PCN reaction occurring within the last 10 years: No If all of the above answers are "NO", then may proceed with Cephalosporin use.      Medication List    STOP taking these medications   aspirin 81 MG tablet Replaced by:  aspirin 81 MG chewable tablet     TAKE these medications   acetaminophen 500 MG tablet Commonly known as:  TYLENOL Take 1,000 mg by mouth 2 (two) times daily as needed for moderate pain.   aspirin 81 MG chewable tablet Chew 1 tablet (81 mg total) by mouth 2 (two) times daily. Replaces:   aspirin 81 MG tablet   atorvastatin 20 MG tablet Commonly known as:  LIPITOR Take 1 tablet (20 mg total) by mouth daily. What changed:  when to take this   CALCIUM 600+D PO Take 1 tablet by mouth 2 (two) times daily.   CENTRUM SILVER ADULT 50+ PO Take 1 tablet by mouth daily.   denosumab 60 MG/ML Sosy injection Commonly known as:  PROLIA Inject 60 mg into the skin every 6 (six) months.   dextromethorphan-guaiFENesin 30-600 MG 12hr tablet Commonly known as:  MUCINEX DM Take 1 tablet by mouth 2 (two) times daily as needed for cough.   diphenhydramine-acetaminophen 25-500 MG Tabs tablet Commonly known as:  TYLENOL PM Take 1 tablet by mouth at bedtime as needed (sleep).   gabapentin 600 MG tablet Commonly known as:  NEURONTIN Take 1 tablet (600 mg total) by mouth 3 (three) times daily.   HYDROmorphone 2 MG tablet Commonly known as:  DILAUDID Take 1 tablet (2 mg total) by mouth every 4 (four) hours as needed for severe pain.   ibuprofen 200 MG tablet Commonly known as:  ADVIL,MOTRIN Take 600 mg by mouth 2 (two) times daily as needed for headache or moderate pain.   METAMUCIL FIBER PO Take 1 Dose by mouth every evening.   methocarbamol 500 MG tablet Commonly known as:  ROBAXIN Take 1 tablet (500 mg total) by mouth every 6 (six) hours as needed for muscle spasms.   ondansetron 4 MG disintegrating tablet Commonly known as:  ZOFRAN-ODT Take 1 tablet (4 mg total) by mouth every 8 (eight) hours as needed for nausea or vomiting.   pyridOXINE 100 MG tablet Commonly known as:  VITAMIN B-6 Take 100 mg by mouth daily.   Vitamin D-3 1000 units Caps Take 1,000 Units by mouth daily.            Durable Medical Equipment  (From admission, onward)         Start     Ordered   01/31/18 1410  DME 3 n 1  Once     01/31/18 1409   01/31/18 1410  DME Walker rolling  Once    Question:  Patient needs a walker to treat with the following condition  Answer:  Status post total knee  replacement, right   01/31/18 1409          Diagnostic Studies: Dg Knee Right Port  Result Date: 01/31/2018 CLINICAL DATA:  Status post right knee arthroplasty. EXAM: PORTABLE RIGHT KNEE - 1-2  VIEW COMPARISON:  None. FINDINGS: Knee prosthetic components appear well seated and well aligned. No acute fracture or evidence of an operative complication. IMPRESSION: Well-positioned total right knee arthroplasty. Electronically Signed   By: Amie Portland M.D.   On: 01/31/2018 19:08    Disposition: Discharge disposition: 01-Home or Self Care       Discharge Instructions    Discharge patient   Complete by:  As directed    Discharge disposition:  01-Home or Self Care   Discharge patient date:  02/01/2018      Follow-up Information    [facility].   Specialty:  Home Health Services Why:  Physical therapy Contact information: [address] [place] [address] [phone]        Kathryne Hitch, MD Follow up in 2 week(s).   Specialty:  Orthopedic Surgery Contact information: [address] [place] [address] [phone]            Signed: Kathryne Hitch 02/01/2018, 11:21 AM

## 2018-02-01 NOTE — Progress Notes (Signed)
Subjective: 1 Day Post-Op Procedure(s) (LRB): RIGHT TOTAL KNEE ARTHROPLASTY (Right) Patient reports pain as moderate.  Slight acute blood loss anemia from surgery, but tolerating well.  Less nausea as well today.  Objective: Vital signs in last 24 hours: Temp:  [96 F (35.6 C)-98.8 F (37.1 C)] 98.6 F (37 C) (09/21 1010) Pulse Rate:  [60-81] 78 (09/21 1010) Resp:  [10-23] 17 (09/21 1010) BP: (90-123)/(65-77) 123/75 (09/21 1010) SpO2:  [91 %-100 %] 98 % (09/21 1010) Weight:  [59 kg] 59 kg (09/20 1400)  Intake/Output from previous day: 09/20 0701 - 09/21 0700 In: 4726.4 [P.O.:300; I.V.:4171.4; IV Piggyback:255] Out: 2150 [Urine:2050; Blood:100] Intake/Output this shift: Total I/O In: 240 [P.O.:240] Out: 700 [Urine:700]  Recent Labs    02/01/18 0350  HGB 10.0*   Recent Labs    02/01/18 0350  WBC 11.3*  RBC 3.24*  HCT 30.1*  PLT 207   Recent Labs    02/01/18 0350  NA 141  K 4.9  CL 108  CO2 25  BUN 12  CREATININE 0.68  GLUCOSE 116*  CALCIUM 8.5*   No results for input(s): LABPT, INR in the last 72 hours.  Sensation intact distally Intact pulses distally Dorsiflexion/Plantar flexion intact Incision: no drainage No cellulitis present Compartment soft   Patient's anticipated LOS is less than 2 midnights, meeting these requirements: - Younger than 65 - Lives within 1 hour of care - Has a competent adult at home to recover with post-op recover - NO history of  - Chronic pain requiring opiods  - Diabetes  - Coronary Artery Disease  - Heart failure  - Heart attack  - Stroke  - DVT/VTE  - Cardiac arrhythmia  - Respiratory Failure/COPD  - Renal failure  - Anemia  - Advanced Liver disease      Assessment/Plan: 1 Day Post-Op Procedure(s) (LRB): RIGHT TOTAL KNEE ARTHROPLASTY (Right) Up with therapy Discharge home with home health this afternoon vs tomorrow depending on therapy and how she feels.    Yesenia Jones 02/01/2018, 11:01  AM

## 2018-02-01 NOTE — Progress Notes (Signed)
Physical Therapy Treatment Patient Details Name: Yesenia Jones MRN: [id number] DOB: 1946-04-21 Today's Date: 02/01/2018    History of Present Illness Pt is a 72 YO female s/p R TKR on 9/20. PMH includes OA, hearing loss, plantar facsiitis, tinnitus, HLD, OP, other knee surgery.     PT Comments    Pt progressing well; will see again this pm, feel pt will be able to D/C after second session; Dr. Magnus Ivan aware  Follow Up Recommendations  Follow surgeon's recommendation for DC plan and follow-up therapies;Supervision for mobility/OOB(HHPT)     Equipment Recommendations  None recommended by PT    Recommendations for Other Services       Precautions / Restrictions Precautions Precautions: Fall;Knee Precaution Comments: reviewed no pillow under knee Restrictions Weight Bearing Restrictions: No Other Position/Activity Restrictions: WBAT     Mobility  Bed Mobility Overal bed mobility: Needs Assistance Bed Mobility: Sit to Supine       Sit to supine: Min guard;Min assist   General bed mobility comments: light guidance with right LE into bed  Transfers Overall transfer level: Needs assistance Equipment used: Rolling walker (2 wheeled) Transfers: Sit to/from Stand Sit to Stand: Min guard;Supervision         General transfer comment: cues for hand placement  Ambulation/Gait Ambulation/Gait assistance: Min guard Gait Distance (Feet): 165 Feet Assistive device: Rolling walker (2 wheeled) Gait Pattern/deviations: Step-to pattern;Antalgic;Decreased weight shift to right     General Gait Details: cues for sequence and RW safety   Stairs Stairs: Yes Stairs assistance: Min guard Stair Management: One rail Right;One rail Left;Step to pattern;Sideways Number of Stairs: 7 General stair comments: cues for sequence and technique, pt husband present for session   Wheelchair Mobility    Modified Rankin (Stroke Patients Only)       Balance Overall balance  assessment: Mild deficits observed, not formally tested(reliant on UEs )                                          Cognition Arousal/Alertness: Awake/alert Behavior During Therapy: WFL for tasks assessed/performed Overall Cognitive Status: Within Functional Limits for tasks assessed                                        Exercises      General Comments        Pertinent Vitals/Pain Pain Assessment: 0-10 Pain Score: 5  Pain Location: right knee Pain Descriptors / Indicators: Grimacing;Guarding;Sore Pain Intervention(s): Limited activity within patient's tolerance;Monitored during session;Premedicated before session;Repositioned;Ice applied    Home Living                      Prior Function            PT Goals (current goals can now be found in the care plan section) Acute Rehab PT Goals PT Goal Formulation: With patient Time For Goal Achievement: 02/14/18 Potential to Achieve Goals: Good Progress towards PT goals: Progressing toward goals    Frequency    7X/week      PT Plan Current plan remains appropriate    Co-evaluation              AM-PAC PT "6 Clicks" Daily Activity  Outcome Measure  Difficulty turning over in bed (including adjusting bedclothes,  sheets and blankets)?: A Lot Difficulty moving from lying on back to sitting on the side of the bed? : Unable Difficulty sitting down on and standing up from a chair with arms (e.g., wheelchair, bedside commode, etc,.)?: A Little Help needed moving to and from a bed to chair (including a wheelchair)?: A Little Help needed walking in hospital room?: A Little Help needed climbing 3-5 steps with a railing? : A Little 6 Click Score: 15    End of Session Equipment Utilized During Treatment: Gait belt(IND SLRs, KI not used ) Activity Tolerance: Patient tolerated treatment well Patient left: with call bell/phone within reach;in bed;with family/visitor present Nurse  Communication: Mobility status PT Visit Diagnosis: Other abnormalities of gait and mobility (R26.89);Difficulty in walking, not elsewhere classified (R26.2)     Time: 6720-9470 PT Time Calculation (min) (ACUTE ONLY): 21 min  Charges:  $Gait Training: 8-22 mins                    Drucilla Chalet, PT Pager: [phone] 02/01/2018   [facility] Dept [phone]     [facility] 02/01/2018, 11:58 AM

## 2018-02-01 NOTE — Discharge Instructions (Signed)

## 2018-02-01 NOTE — Plan of Care (Signed)
  Problem: Health Behavior/Discharge Planning: Goal: Ability to manage health-related needs will improve Outcome: Progressing   Problem: Clinical Measurements: Goal: Ability to maintain clinical measurements within normal limits will improve Outcome: Progressing Goal: Will remain free from infection Outcome: Progressing Goal: Diagnostic test results will improve Outcome: Progressing Goal: Respiratory complications will improve Outcome: Progressing Goal: Cardiovascular complication will be avoided Outcome: Progressing   Problem: Activity: Goal: Risk for activity intolerance will decrease Outcome: Progressing   Problem: Nutrition: Goal: Adequate nutrition will be maintained Outcome: Progressing   Problem: Coping: Goal: Level of anxiety will decrease Outcome: Progressing   Problem: Elimination: Goal: Will not experience complications related to bowel motility Outcome: Progressing Goal: Will not experience complications related to urinary retention Outcome: Progressing   Problem: Pain Managment: Goal: General experience of comfort will improve Outcome: Progressing   Problem: Safety: Goal: Ability to remain free from injury will improve Outcome: Progressing   Problem: Skin Integrity: Goal: Risk for impaired skin integrity will decrease Outcome: Progressing   Problem: Education: Goal: Knowledge of the prescribed therapeutic regimen will improve Outcome: Progressing Goal: Individualized Educational Video(s) Outcome: Progressing   Problem: Activity: Goal: Ability to avoid complications of mobility impairment will improve Outcome: Progressing Goal: Range of joint motion will improve Outcome: Progressing   Problem: Clinical Measurements: Goal: Postoperative complications will be avoided or minimized Outcome: Progressing   Problem: Pain Management: Goal: Pain level will decrease with appropriate interventions Outcome: Progressing   Problem: Skin  Integrity: Goal: Will show signs of wound healing Outcome: Progressing   

## 2018-02-01 NOTE — Progress Notes (Signed)
Occupational Therapy Evaluation Patient Details Name: Yesenia Jones MRN: [id number] DOB: 1945/09/28 Today's Date: 02/01/2018    History of Present Illness Pt is a 72 YO female s/p R TKR on 9/20. PMH includes OA, hearing loss, plantar facsiitis, tinnitus, HLD, OP, other knee surgery.    Clinical Impression   Instructed patient and husband on tub transfer technique; pt declined education on bathing/dressing/toileting as she is having no difficulty with these tasks. No further OT needs identified; will sign off.    Follow Up Recommendations  Follow surgeon's recommendation for DC plan and follow-up therapies    Equipment Recommendations  None recommended by OT    Recommendations for Other Services       Precautions / Restrictions Precautions Precautions: Fall;Knee Precaution Comments: reviewed no pillow under knee Restrictions Weight Bearing Restrictions: No Other Position/Activity Restrictions: WBAT       Mobility Bed Mobility              Transfers Overall transfer level: Needs assistance Equipment used: Rolling walker (2 wheeled) Transfers: Sit to/from Stand Sit to Stand: Min guard;Supervision         General transfer comment: cues for hand placement    Balance                                          ADL either performed or assessed with clinical judgement   ADL Overall ADL's : Needs assistance/impaired                                 Tub/ Shower Transfer: Tub transfer;Moderate assistance;Anterior/posterior;Rolling walker;Shower seat;Grab bars     General ADL Comments: Patient fully dressed; reports no difficulty getting dressed. Patient also reports she has been toileting with nursing staff without difficulty.     Vision         Perception     Praxis      Pertinent Vitals/Pain Pain Assessment: 0-10 Pain Score: 5  Pain Location: right knee Pain Descriptors / Indicators: Grimacing;Guarding;Sore Pain  Intervention(s): Monitored during session     Hand Dominance     Extremity/Trunk Assessment Upper Extremity Assessment Upper Extremity Assessment: Overall WFL for tasks assessed   Lower Extremity Assessment Lower Extremity Assessment: Defer to PT evaluation       Communication Communication Communication: No difficulties   Cognition Arousal/Alertness: Awake/alert Behavior During Therapy: WFL for tasks assessed/performed Overall Cognitive Status: Within Functional Limits for tasks assessed                                     General Comments       Exercises     Shoulder Instructions      Home Living Family/patient expects to be discharged to:: Private residence Living Arrangements: Spouse/significant other Available Help at Discharge: Family;Available PRN/intermittently Type of Home: House Home Access: Stairs to enter [employer] of Steps: 6 Entrance Stairs-Rails: Left;Right Home Layout: One level     Bathroom Shower/Tub: Tub/shower unit;Door   [employer] Toilet: Standard     Home Equipment: Bedside commode;Tub bench;Walker - 2 wheels;Walker - standard;Cane - quad;Shower seat          Prior Functioning/Environment Level of Independence: Independent  OT Problem List: Decreased strength;Decreased range of motion;Decreased knowledge of use of DME or AE;Pain      OT Treatment/Interventions:      OT Goals(Current goals can be found in the care plan section) Acute Rehab OT Goals Patient Stated Goal: home today OT Goal Formulation: All assessment and education complete, DC therapy  OT Frequency:     Barriers to D/C:            Co-evaluation              AM-PAC PT "6 Clicks" Daily Activity     Outcome Measure Help from another person eating meals?: None Help from another person taking care of personal grooming?: None Help from another person toileting, which includes using toliet, bedpan, or urinal?:  A Little Help from another person bathing (including washing, rinsing, drying)?: A Little Help from another person to put on and taking off regular upper body clothing?: None Help from another person to put on and taking off regular lower body clothing?: A Little 6 Click Score: 21   End of Session Equipment Utilized During Treatment: Rolling walker Nurse Communication: Mobility status  Activity Tolerance: Patient tolerated treatment well Patient left: Other (comment)(in care of PT)  OT Visit Diagnosis: Muscle weakness (generalized) (M62.81)                Time: 4098-1191 OT Time Calculation (min): 8 min Charges:  OT General Charges $OT Visit: 1 Visit OT Evaluation $OT Eval Low Complexity: 1 Low    Gyasi Hazzard A Kailynn Satterly 02/01/2018, 1:48 PM

## 2018-02-03 ENCOUNTER — Encounter (HOSPITAL_COMMUNITY): Payer: Self-pay | Admitting: Orthopaedic Surgery

## 2018-02-03 DIAGNOSIS — H9190 Unspecified hearing loss, unspecified ear: Secondary | ICD-10-CM | POA: Diagnosis not present

## 2018-02-03 DIAGNOSIS — M810 Age-related osteoporosis without current pathological fracture: Secondary | ICD-10-CM | POA: Diagnosis not present

## 2018-02-03 DIAGNOSIS — E785 Hyperlipidemia, unspecified: Secondary | ICD-10-CM | POA: Diagnosis not present

## 2018-02-03 DIAGNOSIS — Z471 Aftercare following joint replacement surgery: Secondary | ICD-10-CM | POA: Diagnosis not present

## 2018-02-03 DIAGNOSIS — Z96653 Presence of artificial knee joint, bilateral: Secondary | ICD-10-CM | POA: Diagnosis not present

## 2018-02-03 DIAGNOSIS — R7303 Prediabetes: Secondary | ICD-10-CM | POA: Diagnosis not present

## 2018-02-04 ENCOUNTER — Telehealth (INDEPENDENT_AMBULATORY_CARE_PROVIDER_SITE_OTHER): Payer: Self-pay | Admitting: Orthopaedic Surgery

## 2018-02-04 NOTE — Telephone Encounter (Signed)
That will be fine. 

## 2018-02-04 NOTE — Telephone Encounter (Signed)
Done

## 2018-02-04 NOTE — Telephone Encounter (Signed)
Creta Levin, PT, from [facility] called to requesting VO for the PT Plan of Care for the following:  3x a week for 2 weeks.   CB#[phone].  Thank you.

## 2018-02-04 NOTE — Telephone Encounter (Signed)
Please advise 

## 2018-02-05 DIAGNOSIS — E785 Hyperlipidemia, unspecified: Secondary | ICD-10-CM | POA: Diagnosis not present

## 2018-02-05 DIAGNOSIS — M810 Age-related osteoporosis without current pathological fracture: Secondary | ICD-10-CM | POA: Diagnosis not present

## 2018-02-05 DIAGNOSIS — Z96653 Presence of artificial knee joint, bilateral: Secondary | ICD-10-CM | POA: Diagnosis not present

## 2018-02-05 DIAGNOSIS — R7303 Prediabetes: Secondary | ICD-10-CM | POA: Diagnosis not present

## 2018-02-05 DIAGNOSIS — Z471 Aftercare following joint replacement surgery: Secondary | ICD-10-CM | POA: Diagnosis not present

## 2018-02-05 DIAGNOSIS — H9190 Unspecified hearing loss, unspecified ear: Secondary | ICD-10-CM | POA: Diagnosis not present

## 2018-02-06 DIAGNOSIS — H9190 Unspecified hearing loss, unspecified ear: Secondary | ICD-10-CM | POA: Diagnosis not present

## 2018-02-06 DIAGNOSIS — Z96653 Presence of artificial knee joint, bilateral: Secondary | ICD-10-CM | POA: Diagnosis not present

## 2018-02-06 DIAGNOSIS — E785 Hyperlipidemia, unspecified: Secondary | ICD-10-CM | POA: Diagnosis not present

## 2018-02-06 DIAGNOSIS — R7303 Prediabetes: Secondary | ICD-10-CM | POA: Diagnosis not present

## 2018-02-06 DIAGNOSIS — Z471 Aftercare following joint replacement surgery: Secondary | ICD-10-CM | POA: Diagnosis not present

## 2018-02-06 DIAGNOSIS — M810 Age-related osteoporosis without current pathological fracture: Secondary | ICD-10-CM | POA: Diagnosis not present

## 2018-02-07 ENCOUNTER — Telehealth (INDEPENDENT_AMBULATORY_CARE_PROVIDER_SITE_OTHER): Payer: Self-pay | Admitting: Orthopaedic Surgery

## 2018-02-07 NOTE — Telephone Encounter (Signed)
Patient left voicemail message requesting a call back from Dr Magnus Ivan or nurse, she has questions regarding medication. She did not leave name of medication on voicemail.

## 2018-02-07 NOTE — Telephone Encounter (Signed)
Patient was asking if she had to take her muscle relaxer and pain medication I told her absolutely not She can take her tylenol instead

## 2018-02-10 DIAGNOSIS — R7303 Prediabetes: Secondary | ICD-10-CM | POA: Diagnosis not present

## 2018-02-10 DIAGNOSIS — Z96653 Presence of artificial knee joint, bilateral: Secondary | ICD-10-CM | POA: Diagnosis not present

## 2018-02-10 DIAGNOSIS — M810 Age-related osteoporosis without current pathological fracture: Secondary | ICD-10-CM | POA: Diagnosis not present

## 2018-02-10 DIAGNOSIS — H9190 Unspecified hearing loss, unspecified ear: Secondary | ICD-10-CM | POA: Diagnosis not present

## 2018-02-10 DIAGNOSIS — E785 Hyperlipidemia, unspecified: Secondary | ICD-10-CM | POA: Diagnosis not present

## 2018-02-10 DIAGNOSIS — Z471 Aftercare following joint replacement surgery: Secondary | ICD-10-CM | POA: Diagnosis not present

## 2018-02-11 NOTE — Telephone Encounter (Signed)
Pt agree on the out of pocket cost. Pt has scheduled the calcium lab and the prolia two days later. Nothing further needed

## 2018-02-11 NOTE — Telephone Encounter (Signed)
Lm on pts vm requesting a call back. Should pt call back, and agree to out of pocket expenses, ok to schedule Calcium lab appt, and prolia injection after 10/18. CRM created

## 2018-02-11 NOTE — Telephone Encounter (Signed)
Verification of benefits have been processed and an approval has been received for pts prolia injection. Pts estimated cost are appx $270. This is only an estimate and cannot be confirmed until benefits are paid. Please advise pt and schedule if needed. If scheduled, once the injection is received, pls contact me back with the date it was received so that I am able to update prolia folder. thanks  

## 2018-02-13 ENCOUNTER — Ambulatory Visit (INDEPENDENT_AMBULATORY_CARE_PROVIDER_SITE_OTHER): Payer: Medicare HMO | Admitting: Orthopaedic Surgery

## 2018-02-13 ENCOUNTER — Encounter (INDEPENDENT_AMBULATORY_CARE_PROVIDER_SITE_OTHER): Payer: Self-pay | Admitting: Orthopaedic Surgery

## 2018-02-13 DIAGNOSIS — Z96651 Presence of right artificial knee joint: Secondary | ICD-10-CM

## 2018-02-13 MED ORDER — HYDROMORPHONE HCL 2 MG PO TABS: 2.0000 mg | ORAL_TABLET | ORAL | 0 refills | Status: DC | PRN

## 2018-02-13 MED ORDER — METHOCARBAMOL 500 MG PO TABS: 500.0000 mg | ORAL_TABLET | Freq: Four times a day (QID) | ORAL | 0 refills | Status: DC | PRN

## 2018-02-13 NOTE — Progress Notes (Signed)
The patient is 2 weeks tomorrow status post a right total knee arthroplasty.  She reports that she is doing well overall.  Her notes from home therapy show that she is making good progress.  She has been able to flex her knee to 90 degrees and has full extension she states.  On exam her incision looks really good.  I remove the staples and placed Steri-Strips.  Her calf is soft.  Her right knee has full extension to about 90 degrees flexion.  It feels ligamentously stable.  Her swelling is to be expected.  This point she will go back to her regimen of just her daily aspirin.  I gave her a prescription for outpatient physical therapy that she would like to do in [place].  I will refill her Dilaudid and her Robaxin.  We will see her back in 4 weeks to see how she is doing overall from motion and mobility standpoint.  No x-rays will be needed.

## 2018-02-14 ENCOUNTER — Other Ambulatory Visit (INDEPENDENT_AMBULATORY_CARE_PROVIDER_SITE_OTHER): Payer: Self-pay | Admitting: Orthopaedic Surgery

## 2018-02-14 MED ORDER — METHOCARBAMOL 500 MG PO TABS: 500.0000 mg | ORAL_TABLET | Freq: Four times a day (QID) | ORAL | 0 refills | Status: DC | PRN

## 2018-02-14 NOTE — Telephone Encounter (Signed)
Patient called advised the pharmacy did not receive the Rx for Methocarbamol. Patient said she  uses the [facility] in [place] on [address]. The number to contact patient is [phone]

## 2018-02-14 NOTE — Telephone Encounter (Signed)
Ok to rf? 

## 2018-02-14 NOTE — Telephone Encounter (Signed)
Medication resent to pharmacy  

## 2018-02-17 DIAGNOSIS — Z96651 Presence of right artificial knee joint: Secondary | ICD-10-CM | POA: Diagnosis not present

## 2018-02-17 DIAGNOSIS — R531 Weakness: Secondary | ICD-10-CM | POA: Diagnosis not present

## 2018-02-19 DIAGNOSIS — R531 Weakness: Secondary | ICD-10-CM | POA: Diagnosis not present

## 2018-02-19 DIAGNOSIS — Z96651 Presence of right artificial knee joint: Secondary | ICD-10-CM | POA: Diagnosis not present

## 2018-02-24 ENCOUNTER — Other Ambulatory Visit (INDEPENDENT_AMBULATORY_CARE_PROVIDER_SITE_OTHER): Payer: Self-pay | Admitting: Orthopaedic Surgery

## 2018-02-24 DIAGNOSIS — Z96651 Presence of right artificial knee joint: Secondary | ICD-10-CM | POA: Diagnosis not present

## 2018-02-24 DIAGNOSIS — R531 Weakness: Secondary | ICD-10-CM | POA: Diagnosis not present

## 2018-02-24 MED ORDER — METHOCARBAMOL 500 MG PO TABS: 500.0000 mg | ORAL_TABLET | Freq: Four times a day (QID) | ORAL | 0 refills | Status: DC | PRN

## 2018-02-24 MED ORDER — HYDROMORPHONE HCL 2 MG PO TABS: 2.0000 mg | ORAL_TABLET | ORAL | 0 refills | Status: DC | PRN

## 2018-02-24 NOTE — Telephone Encounter (Signed)
Please advise 

## 2018-02-24 NOTE — Telephone Encounter (Signed)
Dr. Blackman patient 

## 2018-02-25 ENCOUNTER — Telehealth: Payer: Self-pay | Admitting: Family Medicine

## 2018-02-25 NOTE — Telephone Encounter (Signed)
Spoke to pt who states she had a TKR 3wks ago, and is wanting to confirm that she is still able to receive the prolia injection. She is needing to know today, so that she can change her therapy appt, if needed.  pls advise

## 2018-02-25 NOTE — Telephone Encounter (Signed)
It is fine to proceed with her Prolia injection.

## 2018-02-25 NOTE — Telephone Encounter (Signed)
Copied from CRM [id number]. Topic: General - Other >> Feb 25, 2018  8:06 AM Gean Birchwood R wrote: Patient is calling in requesting a nurse t call her back concerning her prolia injection she states she has a couple of questions  Cb# [phone]

## 2018-02-25 NOTE — Telephone Encounter (Signed)
Spoke to pt and advised per Dr Bedsole 

## 2018-02-26 DIAGNOSIS — Z96651 Presence of right artificial knee joint: Secondary | ICD-10-CM | POA: Diagnosis not present

## 2018-02-26 DIAGNOSIS — R531 Weakness: Secondary | ICD-10-CM | POA: Diagnosis not present

## 2018-02-27 ENCOUNTER — Telehealth: Payer: Self-pay | Admitting: Hematology

## 2018-02-27 NOTE — Telephone Encounter (Signed)
Patient called to the [facility] in regards to an appointment for 03-05-18 showing on her MyChart at [facility] [employer]. / Agent mistakenly scheduled the patient for her Prolia inject at the incorrect office. / I was notified because the agent reached out to the [facility] and was not able to get the patients appointment switched to the correct office. / I spoke with Angelica Chessman, Clinical Supervisor and she was able to work the patient in on Wednesday 03-05-18 @ 10:30. / I cancelled the patients wrong appointment at [facility] and [facility] agreed to schedule at the correct location.  / I called the patient and confirmed that her labs were scheduled on 03-03-18 at [facility], and she would receive her injection on 03-05-18 @ 10:30 at [facility]. / Patient verbalizes understanding.

## 2018-03-03 ENCOUNTER — Other Ambulatory Visit (INDEPENDENT_AMBULATORY_CARE_PROVIDER_SITE_OTHER): Payer: Medicare HMO

## 2018-03-03 ENCOUNTER — Telehealth: Payer: Self-pay | Admitting: Family Medicine

## 2018-03-03 DIAGNOSIS — M810 Age-related osteoporosis without current pathological fracture: Secondary | ICD-10-CM | POA: Diagnosis not present

## 2018-03-03 DIAGNOSIS — Z96651 Presence of right artificial knee joint: Secondary | ICD-10-CM | POA: Diagnosis not present

## 2018-03-03 DIAGNOSIS — R531 Weakness: Secondary | ICD-10-CM | POA: Diagnosis not present

## 2018-03-03 LAB — CALCIUM: Calcium: 10.4 mg/dL (ref 8.4–10.5)

## 2018-03-03 NOTE — Telephone Encounter (Signed)
-----   Message from [name] sent at 02/25/2018  8:59 AM EDT ----- Regarding: Lab orders for Monday, 10.21.19 Lab orders, Calcium?

## 2018-03-05 ENCOUNTER — Ambulatory Visit (INDEPENDENT_AMBULATORY_CARE_PROVIDER_SITE_OTHER): Payer: Medicare HMO | Admitting: *Deleted

## 2018-03-05 ENCOUNTER — Ambulatory Visit: Payer: Medicare HMO

## 2018-03-05 DIAGNOSIS — M810 Age-related osteoporosis without current pathological fracture: Secondary | ICD-10-CM

## 2018-03-05 DIAGNOSIS — Z96651 Presence of right artificial knee joint: Secondary | ICD-10-CM | POA: Diagnosis not present

## 2018-03-05 DIAGNOSIS — R531 Weakness: Secondary | ICD-10-CM | POA: Diagnosis not present

## 2018-03-05 MED ORDER — DENOSUMAB 60 MG/ML ~~LOC~~ SOSY
60.0000 mg | PREFILLED_SYRINGE | Freq: Once | SUBCUTANEOUS | Status: AC
  Administered 2018-03-05: 60 mg via SUBCUTANEOUS

## 2018-03-05 NOTE — Progress Notes (Signed)
Per orders of Dr. Ermalene Searing, injection of prolia given by Desmond Dike. Patient tolerated injection well.

## 2018-03-10 DIAGNOSIS — R531 Weakness: Secondary | ICD-10-CM | POA: Diagnosis not present

## 2018-03-10 DIAGNOSIS — Z96651 Presence of right artificial knee joint: Secondary | ICD-10-CM | POA: Diagnosis not present

## 2018-03-12 DIAGNOSIS — Z96651 Presence of right artificial knee joint: Secondary | ICD-10-CM | POA: Diagnosis not present

## 2018-03-12 DIAGNOSIS — R531 Weakness: Secondary | ICD-10-CM | POA: Diagnosis not present

## 2018-03-13 ENCOUNTER — Encounter (INDEPENDENT_AMBULATORY_CARE_PROVIDER_SITE_OTHER): Payer: Self-pay | Admitting: Orthopaedic Surgery

## 2018-03-13 ENCOUNTER — Ambulatory Visit (INDEPENDENT_AMBULATORY_CARE_PROVIDER_SITE_OTHER): Payer: Medicare HMO | Admitting: Orthopaedic Surgery

## 2018-03-13 DIAGNOSIS — Z96651 Presence of right artificial knee joint: Secondary | ICD-10-CM

## 2018-03-13 MED ORDER — TRAMADOL HCL 50 MG PO TABS: 50.0000 mg | ORAL_TABLET | Freq: Four times a day (QID) | ORAL | 0 refills | Status: DC | PRN

## 2018-03-13 MED ORDER — NAPROXEN 500 MG PO TBEC: 500.0000 mg | DELAYED_RELEASE_TABLET | Freq: Two times a day (BID) | ORAL | 1 refills | Status: DC | PRN

## 2018-03-13 MED ORDER — METHOCARBAMOL 500 MG PO TABS: 500.0000 mg | ORAL_TABLET | Freq: Four times a day (QID) | ORAL | 0 refills | Status: DC | PRN

## 2018-03-13 NOTE — Progress Notes (Signed)
The patient is now 41 days status post a right total knee arthroplasty.  She does feel like she is ready to drive.  She just finished therapy yesterday and is transition to home exercise program and going to the gym.  Her husband actually had a heart attack last Saturday but he is with her today as he is doing well.  She is been having some fatigue but her range of motion is going well.  She has been on Dilaudid the last 6 weeks.  On exam her right knee incision looks good.  Right knee is still swollen to be expected.  She is had a previous left total knee arthroplasty and she knows the swelling takes a while to dissipate.  Her calf is soft.  Her extension is full and her flexion is to almost 110 degrees.  At this point she will continue increase her activities as she tolerates.  We will try tramadol, Robaxin and naproxen for pain management.  I like to see her back in 4 weeks to see how she is doing overall.  All question concerns were answered and addressed.  No x-rays are needed in 4 weeks.

## 2018-03-15 DIAGNOSIS — R69 Illness, unspecified: Secondary | ICD-10-CM | POA: Diagnosis not present

## 2018-03-20 ENCOUNTER — Other Ambulatory Visit: Payer: Self-pay | Admitting: Family Medicine

## 2018-03-20 DIAGNOSIS — Z1231 Encounter for screening mammogram for malignant neoplasm of breast: Secondary | ICD-10-CM

## 2018-04-03 ENCOUNTER — Other Ambulatory Visit (INDEPENDENT_AMBULATORY_CARE_PROVIDER_SITE_OTHER): Payer: Self-pay | Admitting: Orthopaedic Surgery

## 2018-04-03 NOTE — Telephone Encounter (Signed)
Please advise 

## 2018-04-09 ENCOUNTER — Encounter (INDEPENDENT_AMBULATORY_CARE_PROVIDER_SITE_OTHER): Payer: Self-pay | Admitting: Orthopaedic Surgery

## 2018-04-09 ENCOUNTER — Ambulatory Visit (INDEPENDENT_AMBULATORY_CARE_PROVIDER_SITE_OTHER): Payer: Medicare HMO | Admitting: Orthopaedic Surgery

## 2018-04-09 DIAGNOSIS — Z96651 Presence of right artificial knee joint: Secondary | ICD-10-CM

## 2018-04-09 NOTE — Progress Notes (Signed)
The patient is now 68 days status post a right total knee arthroplasty.  She is very pleased with this knee replacement.  She has a remote history of a left total knee arthroplasty.  She is very active individual.  She is 72 years old.  She is taking occasional muscle relaxant and Tylenol.  On examination her extension is full and her flexion is full of the right knee.  There is some mild swelling but no instability on exam and no redness.  At this point we do not need to see her back for 6 months.  She will continue increase her activities as comfort allows.  At 6 months we would have an AP and lateral of the right knee.  All questions concerns were answered and addressed.

## 2018-05-02 ENCOUNTER — Ambulatory Visit
Admission: RE | Admit: 2018-05-02 | Discharge: 2018-05-02 | Disposition: A | Discharge status: Home / Self Care | Payer: Medicare HMO | Source: Ambulatory Visit | Attending: Family Medicine | Admitting: Family Medicine

## 2018-05-02 DIAGNOSIS — Z1231 Encounter for screening mammogram for malignant neoplasm of breast: Secondary | ICD-10-CM

## 2018-06-20 ENCOUNTER — Other Ambulatory Visit: Payer: Self-pay | Admitting: Family Medicine

## 2018-07-03 ENCOUNTER — Ambulatory Visit (INDEPENDENT_AMBULATORY_CARE_PROVIDER_SITE_OTHER): Payer: Medicare HMO

## 2018-07-03 ENCOUNTER — Ambulatory Visit: Payer: Medicare HMO

## 2018-07-03 VITALS — BP 108/72 | HR 70 | Temp 98.6°F | Ht 66.5 in | Wt 122.6 lb

## 2018-07-03 DIAGNOSIS — M810 Age-related osteoporosis without current pathological fracture: Secondary | ICD-10-CM

## 2018-07-03 DIAGNOSIS — E782 Mixed hyperlipidemia: Secondary | ICD-10-CM | POA: Diagnosis not present

## 2018-07-03 DIAGNOSIS — R7303 Prediabetes: Secondary | ICD-10-CM | POA: Diagnosis not present

## 2018-07-03 DIAGNOSIS — Z0000 Encounter for general adult medical examination without abnormal findings: Secondary | ICD-10-CM | POA: Diagnosis not present

## 2018-07-03 LAB — COMPREHENSIVE METABOLIC PANEL
ALK PHOS: 35 U/L — AB (ref 39–117)
ALT: 24 U/L (ref 0–35)
AST: 24 U/L (ref 0–37)
Albumin: 4 g/dL (ref 3.5–5.2)
BILIRUBIN TOTAL: 0.6 mg/dL (ref 0.2–1.2)
BUN: 15 mg/dL (ref 6–23)
CALCIUM: 10.4 mg/dL (ref 8.4–10.5)
CO2: 28 mEq/L (ref 19–32)
Chloride: 105 mEq/L (ref 96–112)
Creatinine, Ser: 0.82 mg/dL (ref 0.40–1.20)
GFR: 68.65 mL/min (ref >60.00)
Glucose, Bld: 100 mg/dL — ABNORMAL HIGH (ref 70–99)
Potassium: 4.6 mEq/L (ref 3.5–5.1)
Sodium: 139 mEq/L (ref 135–145)
TOTAL PROTEIN: 7 g/dL (ref 6.0–8.3)

## 2018-07-03 LAB — CBC WITH DIFFERENTIAL/PLATELET
BASOS ABS: 0 10*3/uL (ref 0.0–0.1)
Basophils Relative: 0.7 % (ref 0.0–3.0)
Eosinophils Absolute: 0.1 10*3/uL (ref 0.0–0.7)
Eosinophils Relative: 1.7 % (ref 0.0–5.0)
HEMATOCRIT: 41.5 % (ref 36.0–46.0)
Hemoglobin: 13.8 g/dL (ref 12.0–15.0)
LYMPHS PCT: 29.9 % (ref 12.0–46.0)
Lymphs Abs: 1.5 10*3/uL (ref 0.7–4.0)
MCHC: 33.2 g/dL (ref 30.0–36.0)
MCV: 89.4 fl (ref 78.0–100.0)
MONOS PCT: 8.3 % (ref 3.0–12.0)
Monocytes Absolute: 0.4 10*3/uL (ref 0.1–1.0)
NEUTROS PCT: 59.4 % (ref 43.0–77.0)
Neutro Abs: 3.1 10*3/uL (ref 1.4–7.7)
Platelets: 241 10*3/uL (ref 150.0–400.0)
RBC: 4.64 Mil/uL (ref 3.87–5.11)
RDW: 16.2 % — ABNORMAL HIGH (ref 11.5–15.5)
WBC: 5.2 10*3/uL (ref 4.0–10.5)

## 2018-07-03 LAB — LIPID PANEL
Cholesterol: 148 mg/dL (ref 0–200)
HDL: 64.5 mg/dL (ref >39.00)
LDL Cholesterol: 71 mg/dL (ref 0–99)
NONHDL: 83.1
Total CHOL/HDL Ratio: 2
Triglycerides: 60 mg/dL (ref 0.0–149.0)
VLDL: 12 mg/dL (ref 0.0–40.0)

## 2018-07-03 LAB — VITAMIN D 25 HYDROXY (VIT D DEFICIENCY, FRACTURES): VITD: 78.02 ng/mL (ref 30.00–100.00)

## 2018-07-03 LAB — HEMOGLOBIN A1C: HEMOGLOBIN A1C: 5.8 % (ref 4.6–6.5)

## 2018-07-03 NOTE — Progress Notes (Signed)
Virl Axe.. I can not sign the note as there are several unfinished areas with asterixis. Can you finish then sign or send to me to sign?   Asterisks removed from note. Encounter closed. Anner Crete, LPN

## 2018-07-03 NOTE — Patient Instructions (Addendum)
Ms. Valliere , Thank you for taking time to come for your Medicare Wellness Visit. I appreciate your ongoing commitment to your health goals. Please review the following plan we discussed and let me know if I can assist you in the future.   These are the goals we discussed: Goals    . Increase physical activity     Starting 07/03/2018, I will continue to exercise for 45-60 minutes 5-6 days per week.        This is a list of the screening recommended for you and due dates:  Health Maintenance  Topic Date Due  . Mammogram  05/03/2019  . Colon Cancer Screening  05/11/2020  . Tetanus Vaccine  06/22/2027  . Flu Shot  Completed  . DEXA scan (bone density measurement)  Completed  .  Hepatitis C: One time screening is recommended by Center for Disease Control  (CDC) for  adults born from [birth year] through 1964.   Completed  . Pneumonia vaccines  Completed   Preventive Care for Adults  A healthy lifestyle and preventive care can promote health and wellness. Preventive health guidelines for adults include the following key practices.  . A routine yearly physical is a good way to check with your health care provider about your health and preventive screening. It is a chance to share any concerns and updates on your health and to receive a thorough exam.  . Visit your dentist for a routine exam and preventive care every 6 months. Brush your teeth twice a day and floss once a day. Good oral hygiene prevents tooth decay and gum disease.  . The frequency of eye exams is based on your age, health, family medical history, use  of contact lenses, and other factors. Follow your health care provider's recommendations for frequency of eye exams.  . Eat a healthy diet. Foods like vegetables, fruits, whole grains, low-fat dairy products, and lean protein foods contain the nutrients you need without too many calories. Decrease your intake of foods high in solid fats, added sugars, and salt. Eat the right amount of  calories for you. Get information about a proper diet from your health care provider, if necessary.  . Regular physical exercise is one of the most important things you can do for your health. Most adults should get at least 150 minutes of moderate-intensity exercise (any activity that increases your heart rate and causes you to sweat) each week. In addition, most adults need muscle-strengthening exercises on 2 or more days a week.  Silver Sneakers may be a benefit available to you. To determine eligibility, you may visit the website: www.silversneakers.com or contact program at (949)782-5828 Mon-Fri between 8AM-8PM.   . Maintain a healthy weight. The body mass index (BMI) is a screening tool to identify possible weight problems. It provides an estimate of body fat based on height and weight. Your health care provider can find your BMI and can help you achieve or maintain a healthy weight.   For adults 20 years and older: ? A BMI below 18.5 is considered underweight. ? A BMI of 18.5 to 24.9 is normal. ? A BMI of 25 to 29.9 is considered overweight. ? A BMI of 30 and above is considered obese.   . Maintain normal blood lipids and cholesterol levels by exercising and minimizing your intake of saturated fat. Eat a balanced diet with plenty of fruit and vegetables. Blood tests for lipids and cholesterol should begin at age 43 and be repeated every 5 years.  If your lipid or cholesterol levels are high, you are over 50, or you are at high risk for heart disease, you may need your cholesterol levels checked more frequently. Ongoing high lipid and cholesterol levels should be treated with medicines if diet and exercise are not working.  . If you smoke, find out from your health care provider how to quit. If you do not use tobacco, please do not start.  . If you choose to drink alcohol, please do not consume more than 2 drinks per day. One drink is considered to be 12 ounces (355 mL) of beer, 5 ounces  (148 mL) of wine, or 1.5 ounces (44 mL) of liquor.  . If you are 35-2 years old, ask your health care provider if you should take aspirin to prevent strokes.  . Use sunscreen. Apply sunscreen liberally and repeatedly throughout the day. You should seek shade when your shadow is shorter than you. Protect yourself by wearing long sleeves, pants, a wide-brimmed hat, and sunglasses year round, whenever you are outdoors.  . Once a month, do a whole body skin exam, using a mirror to look at the skin on your back. Tell your health care provider of new moles, moles that have irregular borders, moles that are larger than a pencil eraser, or moles that have changed in shape or color.

## 2018-07-03 NOTE — Progress Notes (Signed)
Subjective:   Yesenia Jones is a 73 y.o. female who presents for Medicare Annual (Subsequent) preventive examination.  Review of Systems:  N/A Cardiac Risk Factors include: advanced age (>46men, >38 women);dyslipidemia     Objective:     Vitals: BP 108/72 (BP Location: Right Arm, Patient Position: Sitting, Cuff Size: Normal)   Pulse 70   Temp 98.6 F (37 C) (Oral)   Ht 5' 6.5" (1.689 m) Comment: shoes  Wt 122 lb 9.6 oz (55.6 kg)   SpO2 97%   BMI 19.49 kg/m   Body mass index is 19.49 kg/m.  Advanced Directives 07/03/2018 01/31/2018 01/31/2018 01/23/2018 06/21/2017 12/25/2016 04/03/2016  Does Patient Have a Medical Advance Directive? No No No No No No No  Would patient like information on creating a medical advance directive? No - Patient declined No - Patient declined No - Patient declined No - Patient declined No - Patient declined No - Patient declined No - Patient declined    Tobacco Social History   Tobacco Use  Smoking Status Never Smoker  Smokeless Tobacco Never Used     Counseling given: No   Clinical Intake:  Pre-visit preparation completed: Yes  Pain : No/denies pain Pain Score: 0-No pain     Nutritional Status: BMI of 19-24  Normal Nutritional Risks: None Diabetes: No  How often do you need to have someone help you when you read instructions, pamphlets, or other written materials from your doctor or pharmacy?: 1 - Never  Interpreter Needed?: No  Comments: pt lives with spouse Information entered by :: LPinson, LPN  Past Medical History:  Diagnosis Date  . Arthritis   . Hyperlipidemia    Past Surgical History:  Procedure Laterality Date  . CHOLECYSTECTOMY    . KNEE SURGERY    . TOTAL KNEE ARTHROPLASTY Right 01/31/2018   Procedure: RIGHT TOTAL KNEE ARTHROPLASTY;  Surgeon: Kathryne Hitch, MD;  Location: [facility];  Service: Orthopedics;  Laterality: Right;  . TUBAL LIGATION     Family History  Problem Relation Age of Onset  . Stroke  Mother   . Hyperlipidemia Mother   . Hyperlipidemia Father   . Cancer Maternal Aunt        breast cancer   Social History   Socioeconomic History  . Marital status: Married    Spouse name: Not on file  . Number of children: 2  . Years of education: Not on file  . Highest education level: Not on file  Occupational History  . Occupation: retired for [employer]: RETIRED  Social Needs  . Financial resource strain: Not on file  . Food insecurity:    Worry: Not on file    Inability: Not on file  . Transportation needs:    Medical: Not on file    Non-medical: Not on file  Tobacco Use  . Smoking status: Never Smoker  . Smokeless tobacco: Never Used  Substance and Sexual Activity  . Alcohol use: Yes    Alcohol/week: 3.0 standard drinks    Types: 3 Glasses of wine per week    Comment: 1 beer yesterday afternoon 01/30/2018  . Drug use: No  . Sexual activity: Not on file  Lifestyle  . Physical activity:    Days per week: Not on file    Minutes per session: Not on file  . Stress: Not on file  Relationships  . Social connections:    Talks on phone: Not on file    Gets together:  Not on file    Attends religious service: Not on file    Active member of club or organization: Not on file    Attends meetings of clubs or organizations: Not on file    Relationship status: Not on file  Other Topics Concern  . Not on file  Social History Narrative   Regular exercise-yes   Diet: fruits and veggies,water    Outpatient Encounter Medications as of 07/03/2018  Medication Sig  . acetaminophen (TYLENOL) 500 MG tablet Take 1,000 mg by mouth 2 (two) times daily as needed for moderate pain.  Marland Kitchen. aspirin 81 MG chewable tablet Chew 1 tablet (81 mg total) by mouth 2 (two) times daily.  Marland Kitchen. atorvastatin (LIPITOR) 20 MG tablet Take 1 tablet (20 mg total) by mouth at bedtime.  . Calcium Carbonate-Vitamin D (CALCIUM 600+D PO) Take 1 tablet by mouth 2 (two) times daily.  . Cholecalciferol  (VITAMIN D-3) 1000 UNITS CAPS Take 1,000 Units by mouth daily.   Marland Kitchen. denosumab (PROLIA) 60 MG/ML SOSY injection Inject 60 mg into the skin every 6 (six) months.  . dextromethorphan-guaiFENesin (MUCINEX DM) 30-600 MG 12hr tablet Take 1 tablet by mouth 2 (two) times daily as needed for cough.  . diphenhydramine-acetaminophen (TYLENOL PM) 25-500 MG TABS tablet Take 1 tablet by mouth at bedtime as needed (sleep).  . gabapentin (NEURONTIN) 600 MG tablet Take 1 tablet (600 mg total) by mouth 3 (three) times daily.  Marland Kitchen. ibuprofen (ADVIL,MOTRIN) 200 MG tablet Take 600 mg by mouth 2 (two) times daily as needed for headache or moderate pain.  . methocarbamol (ROBAXIN) 500 MG tablet TAKE 1 TABLET (500 MG TOTAL) BY MOUTH EVERY 6 (SIX) HOURS AS NEEDED FOR MUSCLE SPASMS.  . Multiple Vitamins-Minerals (CENTRUM SILVER ADULT 50+ PO) Take 1 tablet by mouth daily.  . ondansetron (ZOFRAN ODT) 4 MG disintegrating tablet Take 1 tablet (4 mg total) by mouth every 8 (eight) hours as needed for nausea or vomiting.  . Psyllium (METAMUCIL FIBER PO) Take 1 Dose by mouth every evening.  . pyridOXINE (VITAMIN B-6) 100 MG tablet Take 100 mg by mouth daily.  . [DISCONTINUED] HYDROmorphone (DILAUDID) 2 MG tablet Take 1 tablet (2 mg total) by mouth every 4 (four) hours as needed for severe pain. (Patient not taking: Reported on 04/09/2018)  . [DISCONTINUED] naproxen (EC NAPROSYN) 500 MG EC tablet Take 1 tablet (500 mg total) by mouth 2 (two) times daily between meals as needed.  . [DISCONTINUED] traMADol (ULTRAM) 50 MG tablet Take 1-2 tablets (50-100 mg total) by mouth every 6 (six) hours as needed. (Patient not taking: Reported on 04/09/2018)   Facility-Administered Encounter Medications as of 07/03/2018  Medication  . denosumab (PROLIA) injection 60 mg    Activities of Daily Living In your present state of health, do you have any difficulty performing the following activities: 07/03/2018 01/31/2018  Hearing? N N  Comment - -    Vision? N N  Difficulty concentrating or making decisions? N N  Walking or climbing stairs? N Y  Dressing or bathing? N N  Doing errands, shopping? N N  Preparing Food and eating ? N -  Using the Toilet? N -  In the past six months, have you accidently leaked urine? N -  Do you have problems with loss of bowel control? N -  Managing your Medications? N -  Managing your Finances? N -  Housekeeping or managing your Housekeeping? N -  Some recent data might be hidden    Patient  Care Team: Excell Seltzer, MD as PCP - General (Family Medicine)    Assessment:   This is a routine wellness examination for Loda.  Exercise Activities and Dietary recommendations Current Exercise Habits: Home exercise routine, Type of exercise: stretching;strength training/weights;treadmill;Other - see comments, Time (Minutes): 60, Frequency (Times/Week): 6, Weekly Exercise (Minutes/Week): 360, Exercise limited by: None identified;Other - see comments  Goals    . Increase physical activity     Starting 07/03/2018, I will continue to exercise for 45-60 minutes 5-6 days per week.        Fall Risk Fall Risk  07/03/2018 06/21/2017 12/25/2016 06/19/2016 05/29/2016  Falls in the past year? 0 Yes Yes Yes No  Comment - accidental fall after tripping over curb; sprain to left ankle - - -  Number falls in past yr: - 1 1 1  -  Injury with Fall? - Yes Yes Yes -  Risk for fall due to : - - - - Other (Comment)   Depression Screen PHQ 2/9 Scores 07/03/2018 06/21/2017 06/19/2016 05/29/2016  PHQ - 2 Score 0 0 0 0  PHQ- 9 Score 0 0 - -  Exception Documentation - - - Other- indicate reason in comment box     Cognitive Function MMSE - Mini Mental State Exam 07/03/2018 06/21/2017  Orientation to time 5 5  Orientation to Place 5 5  Registration 3 3  Attention/ Calculation 0 0  Recall 3 3  Language- name 2 objects 0 0  Language- repeat 1 1  Language- follow 3 step command 3 3  Language- read & follow direction 0 0  Write a  sentence 0 0  Copy design 0 0  Total score 20 20        Immunization History  Administered Date(s) Administered  . Influenza Split 03/23/2011  . Influenza Whole 03/11/2008, 03/17/2010  . Influenza, High Dose Seasonal PF 03/03/2014, 03/15/2018  . Influenza,inj,Quad PF,6+ Mos 03/08/2015, 02/27/2016  . Influenza-Unspecified 02/11/2013, 03/01/2017  . Pneumococcal Conjugate-13 05/28/2014, 02/27/2016  . Pneumococcal Polysaccharide-23 03/25/2008, 06/21/2017  . Td 07/21/2007, 06/21/2017  . Zoster 04/12/2008    Screening Tests Health Maintenance  Topic Date Due  . MAMMOGRAM  05/03/2019  . COLONOSCOPY  05/11/2020  . TETANUS/TDAP  06/22/2027  . INFLUENZA VACCINE  Completed  . DEXA SCAN  Completed  . Hepatitis C Screening  Completed  . PNA vac Low Risk Adult  Completed      Plan:     I have personally reviewed and noted the following in the patient's chart:   . Medical and social history . Use of alcohol, tobacco or illicit drugs  . Current medications and supplements . Functional ability and status . Nutritional status . Physical activity . Advanced directives . List of other physicians . Hospitalizations, surgeries, and ER visits in previous 12 months . Vitals . Screenings to include cognitive, depression, and falls . Referrals and appointments  In addition, I have reviewed and discussed with patient certain preventive protocols, quality metrics, and best practice recommendations. A written personalized care plan for preventive services as well as general preventive health recommendations were provided to patient.     Randa Evens, LPN  5/40/0867

## 2018-07-03 NOTE — Progress Notes (Signed)
I reviewed health advisor's note, was available for consultation, and agree with documentation and plan.  

## 2018-07-03 NOTE — Progress Notes (Signed)
PCP notes:   Health maintenance:  No gaps identified.  Abnormal screenings:   None  Patient concerns:   None  Nurse concerns:  None  Next PCP appt:   07/04/18 @ 1000

## 2018-07-04 ENCOUNTER — Ambulatory Visit (INDEPENDENT_AMBULATORY_CARE_PROVIDER_SITE_OTHER): Payer: Medicare HMO | Admitting: Family Medicine

## 2018-07-04 ENCOUNTER — Encounter: Payer: Self-pay | Admitting: Family Medicine

## 2018-07-04 VITALS — BP 100/62 | HR 79 | Temp 98.7°F | Ht 66.5 in | Wt 123.5 lb

## 2018-07-04 DIAGNOSIS — Z1231 Encounter for screening mammogram for malignant neoplasm of breast: Secondary | ICD-10-CM | POA: Diagnosis not present

## 2018-07-04 DIAGNOSIS — E782 Mixed hyperlipidemia: Secondary | ICD-10-CM

## 2018-07-04 DIAGNOSIS — R7303 Prediabetes: Secondary | ICD-10-CM | POA: Diagnosis not present

## 2018-07-04 DIAGNOSIS — M810 Age-related osteoporosis without current pathological fracture: Secondary | ICD-10-CM | POA: Diagnosis not present

## 2018-07-04 DIAGNOSIS — Z0000 Encounter for general adult medical examination without abnormal findings: Secondary | ICD-10-CM

## 2018-07-04 NOTE — Assessment & Plan Note (Addendum)
Well controlled. Continue current medication. Encouraged exercise, weight maintanence, healthy eating habits.  

## 2018-07-04 NOTE — Addendum Note (Signed)
Addended by: Kerby Nora E on: 07/04/2018 10:47 AM   Modules accepted: Orders

## 2018-07-04 NOTE — Progress Notes (Signed)
Subjective:    Patient ID: LAVONDRA HAUSSER, female    DOB: [birth year], 73 y.o.   MRN: [id number]  HPI  The patient presents for  complete physical and review of chronic health problems. He/She also has the following acute concerns today:none.  Had TKR 01/2018 Husband had a massive MI in last year... upcoming consideration of defibrillator.  She has been under a lot of stress. Overall doing well with mood now.    Clinical Support from 07/03/2018 in [facility] Total Score  0       The patient saw Lu Duffel, LPN for medicare wellness. Note reviewed in detail and important notes copied below.  Health maintenance:  No gaps identified.  Abnormal screenings:   None  07/04/18 Today  Elevated Cholesterol:  LDL at goal on atorvastatin 20 mg daily. Lab Results  Component Value Date   CHOL 148 07/03/2018   HDL 64.50 07/03/2018   LDLCALC 71 07/03/2018   LDLDIRECT 149.3 04/06/2013   TRIG 60.0 07/03/2018   CHOLHDL 2 07/03/2018  Using medications without problems:none Muscle aches: none Diet compliance: heart healthy diet Exercise:  Walking 5-6 times a week Other complaints:  Prediabetes:  Lab Results  Component Value Date   HGBA1C 5.8 07/03/2018   Osteoporosis: on prolia. Due for re-eval in 04/2019  Social History /Family History/Past Medical History reviewed in detail and updated in EMR if needed. Blood pressure 100/62, pulse 79, temperature 98.7 F (37.1 C), temperature source Oral, height 5' 6.5" (1.689 m), weight 123 lb 8 oz (56 kg).  Review of Systems  Constitutional: Negative for fatigue and fever.  HENT: Negative for congestion.   Eyes: Negative for pain.  Respiratory: Negative for cough and shortness of breath.   Cardiovascular: Negative for chest pain, palpitations and leg swelling.  Gastrointestinal: Negative for abdominal pain.  Genitourinary: Negative for dysuria and vaginal bleeding.  Musculoskeletal: Negative for  back pain.  Neurological: Negative for syncope, light-headedness and headaches.  Psychiatric/Behavioral: Negative for dysphoric mood.       Objective:   Physical Exam Constitutional:      General: She is not in acute distress.    Appearance: Normal appearance. She is well-developed and normal weight. She is not ill-appearing or toxic-appearing.  HENT:     Head: Normocephalic.     Right Ear: Hearing, tympanic membrane, ear canal and external ear normal.     Left Ear: Hearing, tympanic membrane, ear canal and external ear normal.     Nose: Nose normal.  Eyes:     General: Lids are normal. Lids are everted, no foreign bodies appreciated.     Conjunctiva/sclera: Conjunctivae normal.     Pupils: Pupils are equal, round, and reactive to light.  Neck:     Musculoskeletal: Normal range of motion and neck supple.     Thyroid: No thyroid mass or thyromegaly.     Vascular: No carotid bruit.     Trachea: Trachea normal.  Cardiovascular:     Rate and Rhythm: Normal rate and regular rhythm.     Heart sounds: Normal heart sounds, S1 normal and S2 normal. No murmur. No gallop.   Pulmonary:     Effort: Pulmonary effort is normal. No respiratory distress.     Breath sounds: Normal breath sounds. No wheezing, rhonchi or rales.  Abdominal:     General: Bowel sounds are normal. There is no distension or abdominal bruit.     Palpations: Abdomen is soft. There is  no fluid wave or mass.     Tenderness: There is no abdominal tenderness. There is no guarding or rebound.     Hernia: No hernia is present.  Lymphadenopathy:     Cervical: No cervical adenopathy.  Skin:    General: Skin is warm and dry.     Findings: No rash.  Neurological:     Mental Status: She is alert.     Cranial Nerves: No cranial nerve deficit.     Sensory: No sensory deficit.  Psychiatric:        Mood and Affect: Mood is not anxious or depressed.        Speech: Speech normal.        Behavior: Behavior normal. Behavior is  cooperative.        Judgment: Judgment normal.           Assessment & Plan:  The patient's preventative maintenance and recommended screening tests for an annual wellness exam were reviewed in full today. Brought up to date unless services declined.  Counselled on the importance of diet, exercise, and its role in overall health and mortality. The patient's FH and SH was reviewed, including their home life, tobacco status, and drug and alcohol status.   Vaccines: Up to date  Colon: 04/2010 no polyps.. Repeat in 10 years. Dr. Juanda Chance.  Mammo:Last 04/2018 nml. DXA: osteopenia 02/2009 on fosamax (on for 5-6 years, never diagnosed with osteoporosis)... Stopped in May 2013 when she ran out.Marland Kitchen DEXA 04/22/2013: osteoporosis, started back on fosamax. Recheck in 04/2015: worsened.. Started on prolia 1/2017No issues with that.  04/2017 on yr 2 prolia..Improvement in spine, none yet in hip, repeat 04/2019 PAP/DVE: Previously done at [facility], but they retired. Last PAP done nml 2011, all previous were normal 3 in a row,has history of abnormal pap and crypothrapy 20 years ago, No further paps needed, plan everyfew yearDVE. Asymptomatic. Hep C:neg

## 2018-07-04 NOTE — Assessment & Plan Note (Signed)
Continue prolia, DEXa 04/2019

## 2018-07-04 NOTE — Patient Instructions (Signed)
Keep up great work on healthy eating habits and regular exercise! 

## 2018-07-07 ENCOUNTER — Telehealth: Payer: Self-pay | Admitting: Family Medicine

## 2018-07-07 NOTE — Telephone Encounter (Signed)
Pt returning your call

## 2018-07-07 NOTE — Telephone Encounter (Signed)
Left message asking pt to call office regarding mammogram and bone density.  Transfer to robin If im not here anyone up front can help pt schedule

## 2018-07-09 NOTE — Telephone Encounter (Signed)
Appointment 05/04/2019 Pt aware

## 2018-07-25 ENCOUNTER — Other Ambulatory Visit: Payer: Self-pay | Admitting: Family Medicine

## 2018-07-29 ENCOUNTER — Other Ambulatory Visit: Payer: Self-pay | Admitting: *Deleted

## 2018-07-29 ENCOUNTER — Encounter: Payer: Self-pay | Admitting: *Deleted

## 2018-07-29 MED ORDER — GABAPENTIN 600 MG PO TABS: 600.0000 mg | ORAL_TABLET | Freq: Three times a day (TID) | ORAL | 3 refills | Status: DC

## 2018-08-11 ENCOUNTER — Telehealth: Payer: Self-pay | Admitting: Family Medicine

## 2018-08-11 NOTE — Telephone Encounter (Signed)
Pt calling to set up her Prolia injection. Please call pt.

## 2018-08-15 NOTE — Telephone Encounter (Signed)
Please refer to my chart communications regarding this.    Thanks.

## 2018-08-15 NOTE — Telephone Encounter (Signed)
Insurance benefits are processing for Prolia.  Will notify patient once benefits have been identified and injection can be scheduled.

## 2018-08-19 NOTE — Telephone Encounter (Signed)
PA submitted for approval

## 2018-09-08 NOTE — Telephone Encounter (Signed)
Prior authorization approved and patient is scheduled for injection on 09/09/18 and is aware.

## 2018-09-09 ENCOUNTER — Ambulatory Visit (INDEPENDENT_AMBULATORY_CARE_PROVIDER_SITE_OTHER): Payer: Medicare HMO

## 2018-09-09 ENCOUNTER — Other Ambulatory Visit: Payer: Self-pay

## 2018-09-09 DIAGNOSIS — M810 Age-related osteoporosis without current pathological fracture: Secondary | ICD-10-CM

## 2018-09-09 MED ORDER — DENOSUMAB 60 MG/ML ~~LOC~~ SOSY
60.0000 mg | PREFILLED_SYRINGE | Freq: Once | SUBCUTANEOUS | Status: AC
  Administered 2018-09-09: 60 mg via SUBCUTANEOUS

## 2018-09-09 NOTE — Progress Notes (Signed)
Per orders of Dr. Ermalene Searing, injection of Prolia given by Roena Malady. Patient tolerated injection well.

## 2018-09-14 ENCOUNTER — Other Ambulatory Visit: Payer: Self-pay | Admitting: Family Medicine

## 2018-09-16 ENCOUNTER — Encounter: Payer: Self-pay | Admitting: Orthopaedic Surgery

## 2018-09-18 ENCOUNTER — Ambulatory Visit: Payer: Medicare HMO | Admitting: Orthopaedic Surgery

## 2018-09-18 ENCOUNTER — Ambulatory Visit (INDEPENDENT_AMBULATORY_CARE_PROVIDER_SITE_OTHER): Payer: Medicare HMO

## 2018-09-18 ENCOUNTER — Other Ambulatory Visit: Payer: Self-pay

## 2018-09-18 DIAGNOSIS — M25561 Pain in right knee: Secondary | ICD-10-CM

## 2018-09-18 DIAGNOSIS — M25511 Pain in right shoulder: Secondary | ICD-10-CM

## 2018-09-18 DIAGNOSIS — Z96651 Presence of right artificial knee joint: Secondary | ICD-10-CM | POA: Diagnosis not present

## 2018-09-18 NOTE — Progress Notes (Signed)
.  Office Visit Note   Patient: Yesenia Jones           Date of Birth: 1945/09/29           MRN: [id number] Visit Date: 09/18/2018              Requested by: Excell Seltzer, MD [address] [place], [place] [address] PCP: Excell Seltzer, MD   Assessment & Plan: Visit Diagnoses:  1. Acute pain of right knee   2. Acute pain of right shoulder   3. Status post total knee replacement, right     Plan: I was able to give her reassurance that I do not see any type of fracture and that her right total knee arthroplasty implants look good.  She will slowly increase her activities as comfort allows.  All question concerns were answered and addressed.  We will see her back at her regular visit 4 months from now for her one-year postoperative visit after knee replacement surgery.  We can have an AP and lateral of her right knee at that visit.  If she is experiencing any issues before then especially with her right shoulder she will let us know.  Follow-Up Instructions: Return in about 4 months (around 01/19/2019).   Orders:  Orders Placed This Encounter  Procedures  . XR Knee 1-2 Views Right  . XR Shoulder Right   No orders of the defined types were placed in this encounter.     Procedures: No procedures performed   Clinical Data: No additional findings.   Subjective: Chief Complaint  Patient presents with  . Right Knee - Pain  . Right Shoulder - Pain  The patient is a very pleasant 73 year old female who is 8 months status post a right total knee arthroplasty.  Her follow-up was believe later but 10 days ago she had a mechanical fall injuring her right knee and her right arm.  She slipped on a rug in her house.  She had bruising that she is noticed along her right upper arm with pain from the upper arm down to the forearm.  She has been able to use her arm but is painful with overhead activities when using the right shoulder.  Her right knee is also been painful on the medial  aspect of her knee.  It does not feel unstable to her and she is been able to maintain her motion.  She has not had a lot of swelling.  She has had no other acute changes in her medical status.  HPI  Review of Systems She currently denies any headache, chest pain, shortness of breath, fever, chills, nausea, vomiting  Objective: Vital Signs: There were no vitals taken for this visit.  Physical Exam She is alert and orient x3 and in no acute distress Ortho Exam Examination of her right shoulder shows full range of motion that is painful.  She is using her rotator cuff appropriately to abduct her shoulder.  There is slight weakness with external rotation.  There is significant bruising around her upper arm down into her forearm.  Her elbow function and hand function are normal.  Her liftoff is negative.  Examination of her right knee shows full range of motion the right knee.  There is bruising anteriorly at the patella tendon and tibial tubercle area but this is only faint bruising.  There is no knee joint effusion.  The knee has full range of motion and feels ligamentously stable. Specialty  Comments:  No specialty comments available.  Imaging: Xr Knee 1-2 Views Right  Result Date: 09/18/2018 An AP and lateral of the right knee shows a total knee arthroplasty with no complicating features.  There is no evidence of loosening.  The left knee can be seen also on the AP view which has a total knee with no complicating features of the components on the left knee in the AP view.  Xr Shoulder Right  Result Date: 09/18/2018 3 views of the right shoulder show no acute findings.  The shoulder is well located.    PMFS History: Patient Active Problem List   Diagnosis Date Noted  . Status post total knee replacement, right 01/31/2018  . Plantar fasciitis 02/07/2016  . Prediabetes 06/17/2015  . Counseling regarding end of life decision making 05/28/2014  . Hearing loss 04/04/2012  . Tinnitus  04/04/2012  . Hyperlipidemia 04/14/2010  . MORTON'S NEUROMA 04/14/2010  . TALIPES CAVUS 04/14/2010  . Osteoporosis 03/17/2010  . ALLERGIC RHINITIS 08/25/2008   Past Medical History:  Diagnosis Date  . Arthritis   . Hyperlipidemia     Family History  Problem Relation Age of Onset  . Stroke Mother   . Hyperlipidemia Mother   . Hyperlipidemia Father   . Cancer Maternal Aunt        breast cancer    Past Surgical History:  Procedure Laterality Date  . CHOLECYSTECTOMY    . KNEE SURGERY    . TOTAL KNEE ARTHROPLASTY Right 01/31/2018   Procedure: RIGHT TOTAL KNEE ARTHROPLASTY;  Surgeon: Kathryne HitchBlackman, Lynnzie Blackson Y, MD;  Location: [facility];  Service: Orthopedics;  Laterality: Right;  . TUBAL LIGATION     Social History   Occupational History  . Occupation: retired for [employer]    Employer: RETIRED  Tobacco Use  . Smoking status: Never Smoker  . Smokeless tobacco: Never Used  Substance and Sexual Activity  . Alcohol use: Yes    Alcohol/week: 3.0 standard drinks    Types: 3 Glasses of wine per week    Comment: 1 beer yesterday afternoon 01/30/2018  . Drug use: No  . Sexual activity: Not on file

## 2018-09-22 ENCOUNTER — Ambulatory Visit: Payer: Medicare HMO | Admitting: Orthopaedic Surgery

## 2018-10-08 ENCOUNTER — Ambulatory Visit: Payer: Self-pay | Admitting: Orthopaedic Surgery

## 2018-11-04 ENCOUNTER — Other Ambulatory Visit: Payer: Self-pay

## 2018-11-04 ENCOUNTER — Ambulatory Visit: Payer: Medicare HMO | Admitting: Sports Medicine

## 2018-11-04 ENCOUNTER — Encounter: Payer: Self-pay | Admitting: Sports Medicine

## 2018-11-04 DIAGNOSIS — M7581 Other shoulder lesions, right shoulder: Secondary | ICD-10-CM | POA: Diagnosis not present

## 2018-11-04 DIAGNOSIS — Z96651 Presence of right artificial knee joint: Secondary | ICD-10-CM

## 2018-11-04 DIAGNOSIS — G5761 Lesion of plantar nerve, right lower limb: Secondary | ICD-10-CM

## 2018-11-04 NOTE — Patient Instructions (Signed)
Today we saw you for follow up for your neuropathy. Continue 600mg  three times a day of Gabapentin. Discussed common side effects. Follow up in 3 months to reevaluate. Examined orthotics. They are in good shape. Continue wearing them. We can make you another pair when those are worn out if needed.  For your shoulder, recommend rotator cuff strengthening using up to a 3lb weight with repetitions of 10-15 and 3 sets 3 times per week. The handout was given today.  For your right knee, congratulations on your success after your knee replacement. Recommend doing single leg exercises like the 45 degree wall slide and the single leg right squat sets of 8-10 x 3.

## 2018-11-04 NOTE — Assessment & Plan Note (Signed)
-  Reviewed previous x-rays of the knee showing stable hardware without evidence of loosening -Added on single leg squatting exercises 10 repetitions x3 daily as well as 45 degrees squats against the wall for further quadriceps and hamstring strengthening -She will follow-up with her orthopedic surgeon for her one-year follow-up in approximately 4 months

## 2018-11-04 NOTE — Progress Notes (Signed)
Yesenia Jones - 73 y.o. female MRN [id number]  Date of birth: December 05, 1945   Chief complaint: Neuroma follow up, R knee s/p TKA, R shoulder  SUBJECTIVE:    History of present illness: 73 year old female who presents today for follow-up of her Morton's neuroma as well as to discuss her right knee after her total knee arthroplasty that was performed back in September 2019.  For her foot pain, she states this has been controlled significantly with custom orthotics.  She also takes gabapentin 600 mg 3 times daily which has resolved numbness and tingling over the dorsum of her feet bilaterally.  She states she has trialed several times off of gabapentin however her neuropathic symptoms do come back.  She just got a refill from this.  Denies any side effects of the medication.  She would like to continue this if possible.  For her right knee, she is doing very well status post total knee arthroplasty by Dr. Magnus Ivan.  She did have a fall several weeks ago and had x-rays performed at the orthopedic office.  It demonstrated no hardware complications.  Her pain has now resolved in her right knee.  She did however injure her right shoulder during the fall.  She fell on outstretched hand subsequently having pain in her right shoulder.  X-rays at the orthopedic office were negative.  She was placed on a rotator cuff strengthening protocol with improvement in her symptoms.  Today pain is rated 2 out of 10.  Denies any neck pain numbness or tingling.  No significant weakness on the right side.   Review of systems:  Per HPI; in addition no fever, no rash, no additional weakness, no additional numbness, no additional paresthesias.   Interval past medical history, surgical history, family history, and social history obtained and are unchanged.   Of note, she had a recent right-sided total knee arthroplasty in September 2019.  She also has a history of Morton's neuroma on the right.  Medications reviewed and  unchanged.  Of note she is on gabapentin 600 mg 3 times daily. Allergies reviewed and unchanged.  OBJECTIVE:  Physical exam: Vital signs are reviewed. BP 120/66   Ht 5' 6.5" (1.689 m)   Wt 123 lb (55.8 kg)   BMI 19.56 kg/m   Gen.: Alert, oriented, appears stated age, in no apparent distress Integumentary: No rashes or ecchymoses Neurologic:  Sensation intact to light touch L4-S1 bilaterally Gait: normal without associated limp Musculoskeletal:  Inspection of her right knee demonstrates a healed anterior incision.  She has no tenderness to palpation.  Full pain-free range of motion.  Strength testing is 5 out of 5 with resisted knee flexion and extension.  She is able to do a double leg squat without any significant issues.  Mild weakness associated with single-leg squat on the right side as compared to the left.  Stable to varus valgus stress.  Neurovascular intact.  Inspection of her right foot demonstrates a bunion and bunionette deformity.  No significant tenderness to palpation.  Collapse of her transverse arch noted.  Full range of motion ankle dorsiflexion plantarflexion.  Negative anterior drawer negative ankle inversion eversion test.  Posterior tibialis tendon 5 out of 5.  Inspection of the right shoulder demonstrates no acute abnormality.  No tenderness over her AC joint.  Range of motion in flexion actively to 160 degrees and abduction to 160 degrees.  Full passive range of motion although slight pain.  Pain also with abduction of the arm.  Rotator  cuff strength is 5 out of 5.  Mild discomfort with empty can testing.  Negative Hawkins test.  Negative Neer's test.  Negative speeds and Yergason's testing.  Neurovascularly intact.    ASSESSMENT & PLAN: MORTON'S NEUROMA - on gabapentin 600mg  tid, okay to continue, f/u in 3 months.  Counseled on common and severe side effects of the medication -Continue with custom orthotics.  May be a candidate for a repeat pair in the near future  however current orthotics are in good condition  Status post total knee replacement, right -Reviewed previous x-rays of the knee showing stable hardware without evidence of loosening -Added on single leg squatting exercises 10 repetitions x3 daily as well as 45 degrees squats against the wall for further quadriceps and hamstring strengthening -She will follow-up with her orthopedic surgeon for her one-year follow-up in approximately 4 months  Rotator cuff tendonitis, right - traumatic -Reviewed x-rays taken by her orthopedic surgeon showing mild to moderate glenohumeral joint osteoarthritis however no fractures -Rotator cuff strength is adequate today -Added home exercise program for rotator cuff strengthening increasing to approximately 3 pounds -If not improving, recommend diagnostic ultrasound at next visit    Clydene Laming, Yesenia Jones  I observed and examined the patient with Dr. Lunette Stands and agree with assessment and plan.  Note reviewed and modified by me. Yesenia Mcgill, MD

## 2018-11-04 NOTE — Assessment & Plan Note (Addendum)
-   on gabapentin 600mg  tid, okay to continue, f/u in 3 months.  Counseled on common and severe side effects of the medication -Continue with custom orthotics.  May be a candidate for a repeat pair in the near future however current orthotics are in good condition

## 2018-11-04 NOTE — Assessment & Plan Note (Signed)
-   traumatic -Reviewed x-rays taken by her orthopedic surgeon showing mild to moderate glenohumeral joint osteoarthritis however no fractures -Rotator cuff strength is adequate today -Added home exercise program for rotator cuff strengthening increasing to approximately 3 pounds -If not improving, recommend diagnostic ultrasound at next visit

## 2018-11-17 ENCOUNTER — Other Ambulatory Visit: Payer: Self-pay

## 2018-11-17 ENCOUNTER — Ambulatory Visit (INDEPENDENT_AMBULATORY_CARE_PROVIDER_SITE_OTHER): Payer: Medicare HMO | Admitting: Family Medicine

## 2018-11-17 ENCOUNTER — Encounter: Payer: Self-pay | Admitting: Family Medicine

## 2018-11-17 DIAGNOSIS — H1031 Unspecified acute conjunctivitis, right eye: Secondary | ICD-10-CM | POA: Diagnosis not present

## 2018-11-17 DIAGNOSIS — H0289 Other specified disorders of eyelid: Secondary | ICD-10-CM

## 2018-11-17 DIAGNOSIS — H02841 Edema of right upper eyelid: Secondary | ICD-10-CM

## 2018-11-17 DIAGNOSIS — H109 Unspecified conjunctivitis: Secondary | ICD-10-CM | POA: Insufficient documentation

## 2018-11-17 MED ORDER — POLYMYXIN B-TRIMETHOPRIM 10000-0.1 UNIT/ML-% OP SOLN: 1.0000 [drp] | Freq: Four times a day (QID) | OPHTHALMIC | 0 refills | Status: DC

## 2018-11-17 MED ORDER — SULFAMETHOXAZOLE-TRIMETHOPRIM 800-160 MG PO TABS: 1.0000 | ORAL_TABLET | Freq: Two times a day (BID) | ORAL | 0 refills | Status: DC

## 2018-11-17 NOTE — Progress Notes (Signed)
Subjective:    Patient ID: Yesenia Jones, female    DOB: 1945/11/04, 73 y.o.   MRN: [id number]  HPI 73 yo pt of Dr Ermalene Searing here for R eye problem  She could not get in with her eye doctor   Symptoms started 10 d ago with pain lateral to the R eye  Then several days later her eyelid (upper) became red and swollen  Used ibuprofen and hot compresses  Her eyes were watering -not unusual for her   Sat -yellow d/c from eye  That was brief in the am  Yesterday some itching of the R eye  This am had crusty yellow material  Eye ball is not pink - but a little watery at times   Vision is ok  No pain to move eye  No trauma to eye at all  No fb in eye that she knows of  She walks every day early No known insect bites    Does have allergies -she was using visine multi symptom  Yesterday found a "pink eye" homeopathic remedy-no improvement  No h/o auto immune dz  No sinus symptoms at all    Patient Active Problem List   Diagnosis Date Noted  . Pain and swelling of lower eyelid of right eye 11/17/2018  . Conjunctivitis 11/17/2018  . Rotator cuff tendonitis, right 11/04/2018  . Status post total knee replacement, right 01/31/2018  . Plantar fasciitis 02/07/2016  . Prediabetes 06/17/2015  . Counseling regarding end of life decision making 05/28/2014  . Hearing loss 04/04/2012  . Tinnitus 04/04/2012  . Hyperlipidemia 04/14/2010  . MORTON'S NEUROMA 04/14/2010  . TALIPES CAVUS 04/14/2010  . Osteoporosis 03/17/2010  . ALLERGIC RHINITIS 08/25/2008   Past Medical History:  Diagnosis Date  . Arthritis   . Hyperlipidemia    Past Surgical History:  Procedure Laterality Date  . CHOLECYSTECTOMY    . KNEE SURGERY    . TOTAL KNEE ARTHROPLASTY Right 01/31/2018   Procedure: RIGHT TOTAL KNEE ARTHROPLASTY;  Surgeon: Kathryne Hitch, MD;  Location: [facility];  Service: Orthopedics;  Laterality: Right;  . TUBAL LIGATION     Social History   Tobacco Use  . Smoking status: Never  Smoker  . Smokeless tobacco: Never Used  Substance Use Topics  . Alcohol use: Yes    Alcohol/week: 3.0 standard drinks    Types: 3 Glasses of wine per week    Comment: 1 beer yesterday afternoon 01/30/2018  . Drug use: No   Family History  Problem Relation Age of Onset  . Stroke Mother   . Hyperlipidemia Mother   . Hyperlipidemia Father   . Cancer Maternal Aunt        breast cancer   Allergies  Allergen Reactions  . Hydrocodone-Acetaminophen Nausea And Vomiting  . Oxycodone-Acetaminophen Nausea And Vomiting  . Penicillins Rash    Has patient had a PCN reaction causing immediate rash, facial/tongue/throat swelling, SOB or lightheadedness with hypotension: Yes Has patient had a PCN reaction causing severe rash involving mucus membranes or skin necrosis: No Has patient had a PCN reaction that required hospitalization: No Has patient had a PCN reaction occurring within the last 10 years: No If all of the above answers are "NO", then may proceed with Cephalosporin use.    Current Outpatient Medications on File Prior to Visit  Medication Sig Dispense Refill  . acetaminophen (TYLENOL) 500 MG tablet Take 1,000 mg by mouth 2 (two) times daily as needed for moderate pain.    Marland Kitchen  aspirin 81 MG chewable tablet Chew 1 tablet (81 mg total) by mouth 2 (two) times daily. 30 tablet 0  . atorvastatin (LIPITOR) 20 MG tablet TAKE 1 TABLET BY MOUTH EVERYDAY AT BEDTIME 90 tablet 3  . Calcium Carbonate-Vitamin D (CALCIUM 600+D PO) Take 1 tablet by mouth 2 (two) times daily.    . Cholecalciferol (VITAMIN D-3) 1000 UNITS CAPS Take 1,000 Units by mouth daily.     Marland Kitchen denosumab (PROLIA) 60 MG/ML SOSY injection Inject 60 mg into the skin every 6 (six) months.    . dextromethorphan-guaiFENesin (MUCINEX DM) 30-600 MG 12hr tablet Take 1 tablet by mouth 2 (two) times daily as needed for cough.    . diphenhydramine-acetaminophen (TYLENOL PM) 25-500 MG TABS tablet Take 1 tablet by mouth at bedtime as needed (sleep).     . gabapentin (NEURONTIN) 600 MG tablet Take 1 tablet (600 mg total) by mouth 3 (three) times daily. 270 tablet 3  . ibuprofen (ADVIL,MOTRIN) 200 MG tablet Take 600 mg by mouth 2 (two) times daily as needed for headache or moderate pain.    . methocarbamol (ROBAXIN) 500 MG tablet TAKE 1 TABLET (500 MG TOTAL) BY MOUTH EVERY 6 (SIX) HOURS AS NEEDED FOR MUSCLE SPASMS. 60 tablet 0  . Multiple Vitamins-Minerals (CENTRUM SILVER ADULT 50+ PO) Take 1 tablet by mouth daily.    . ondansetron (ZOFRAN ODT) 4 MG disintegrating tablet Take 1 tablet (4 mg total) by mouth every 8 (eight) hours as needed for nausea or vomiting. 20 tablet 0  . Psyllium (METAMUCIL FIBER PO) Take 1 Dose by mouth every evening.    . pyridOXINE (VITAMIN B-6) 100 MG tablet Take 100 mg by mouth daily.     Current Facility-Administered Medications on File Prior to Visit  Medication Dose Route Frequency Provider Last Rate Last Dose  . denosumab (PROLIA) injection 60 mg  60 mg Subcutaneous Q6 months Bedsole, Amy E, MD        Review of Systems  Constitutional: Negative for activity change, appetite change, fatigue, fever and unexpected weight change.  HENT: Negative for congestion, ear pain, rhinorrhea, sinus pressure and sore throat.   Eyes: Positive for discharge, redness and itching. Negative for photophobia, pain and visual disturbance.  Respiratory: Negative for cough, shortness of breath and wheezing.   Cardiovascular: Negative for chest pain and palpitations.  Gastrointestinal: Negative for abdominal pain, blood in stool, constipation and diarrhea.  Endocrine: Negative for polydipsia and polyuria.  Genitourinary: Negative for dysuria, frequency and urgency.  Musculoskeletal: Negative for arthralgias, back pain and myalgias.  Skin: Negative for pallor and rash.  Allergic/Immunologic: Negative for environmental allergies.  Neurological: Negative for dizziness, syncope and headaches.  Hematological: Negative for adenopathy. Does  not bruise/bleed easily.  Psychiatric/Behavioral: Negative for decreased concentration and dysphoric mood. The patient is not nervous/anxious.        Objective:   Physical Exam Constitutional:      General: She is not in acute distress.    Appearance: Normal appearance. She is normal weight. She is not ill-appearing or diaphoretic.  HENT:     Head: Normocephalic and atraumatic.     Right Ear: Tympanic membrane, ear canal and external ear normal.     Left Ear: Tympanic membrane, ear canal and external ear normal.     Nose: Nose normal.     Mouth/Throat:     Mouth: Mucous membranes are moist.     Pharynx: Oropharynx is clear.  Eyes:     General: Vision grossly intact.  Gaze aligned appropriately. No allergic shiner, visual field deficit or scleral icterus.       Right eye: Discharge present.     Extraocular Movements: Extraocular movements intact.     Conjunctiva/sclera:     Right eye: Right conjunctiva is injected. No chemosis, exudate or hemorrhage.    Pupils: Pupils are equal, round, and reactive to light.     Comments: Mild swelling of R eyelid with redness Possible small chalazion R eyelid laterally  No fluctuance/mildly tender  Some mild conj injection as well but no purulent drainage  No fb seen   Cardiovascular:     Rate and Rhythm: Normal rate and regular rhythm.  Pulmonary:     Effort: Pulmonary effort is normal. No respiratory distress.     Breath sounds: Normal breath sounds. No stridor. No wheezing or rales.  Skin:    General: Skin is warm and dry.     Findings: Erythema present. No rash.  Neurological:     Mental Status: She is alert.     Cranial Nerves: No cranial nerve deficit.     Coordination: Coordination normal.  Psychiatric:        Mood and Affect: Mood normal.           Assessment & Plan:   Problem List Items Addressed This Visit      Other   Pain and swelling of upper eyelid of right eye    I suspect related to her mild symptoms of  conjunctivitis in R eye  Cannot r/u chalazion in R eye on exam as well  No vision change or pain with eye movement  (do not suspect orbital cellulitis)  tx with oral bactrim DS (pcn all)  Also polytrim drops Hygiene inst as well  Update if not starting to improve in several days  or if worsening  (consider urgent opth ref if needed) Also watch for vision change or pain with eye movement      Conjunctivitis    In combination with some mild R upper eyelid swelling Cannot r/o chalazion  tx with polytrim opthy 1 drop every 4-6 h  (also oral bactrim) Warm compress Hygiene (linens/throw out prev drops)  Update if not starting to improve in a week or if worsening

## 2018-11-17 NOTE — Assessment & Plan Note (Signed)
In combination with some mild R upper eyelid swelling Cannot r/o chalazion  tx with polytrim opthy 1 drop every 4-6 h  (also oral bactrim) Warm compress Hygiene (linens/throw out prev drops)  Update if not starting to improve in a week or if worsening

## 2018-11-17 NOTE — Assessment & Plan Note (Signed)
I suspect related to her mild symptoms of conjunctivitis in R eye  Cannot r/u chalazion in R eye on exam as well  No vision change or pain with eye movement  (do not suspect orbital cellulitis)  tx with oral bactrim DS (pcn all)  Also polytrim drops Hygiene inst as well  Update if not starting to improve in several days  or if worsening  (consider urgent opth ref if needed) Also watch for vision change or pain with eye movement

## 2018-11-17 NOTE — Patient Instructions (Signed)
Wash hands and linens   Use eye drops as directed Also oral sulfa antibiotic  Use sun protection  Clean warm compress as often as you can   If worse- vision change/ pain moving eye or swelling- let us know asap  If not improving in 2-3 days- call and we will do urgent opth referral

## 2018-11-18 ENCOUNTER — Telehealth: Payer: Self-pay | Admitting: *Deleted

## 2018-11-18 DIAGNOSIS — H02841 Edema of right upper eyelid: Secondary | ICD-10-CM

## 2018-11-18 NOTE — Telephone Encounter (Signed)
Thanks - keep me posted if it does not continue to improve or if worse at any time.

## 2018-11-18 NOTE — Telephone Encounter (Signed)
Pt said it feels better, the redness has improved some, and it's not draining as much. Pt said the swelling is still the same

## 2018-11-18 NOTE — Telephone Encounter (Addendum)
Left VM requesting pt to call the office back and give [place] an update on her eye

## 2018-11-18 NOTE — Telephone Encounter (Signed)
Patient returning call from the office. Requesting call back to give update.

## 2018-11-18 NOTE — Telephone Encounter (Signed)
-----   Message from Abner Greenspan, MD sent at 11/17/2018  6:53 PM EDT ----- Please call and check on tues or WednesdayThanks

## 2018-11-19 DIAGNOSIS — H00021 Hordeolum internum right upper eyelid: Secondary | ICD-10-CM | POA: Diagnosis not present

## 2018-11-19 LAB — HM DIABETES EYE EXAM

## 2018-11-19 NOTE — Telephone Encounter (Signed)
Pt notified of Dr. Marliss Coots comments and said advised me that her eye got a little better yesterday but today it's about the same, pt said it is still swollen a little red and the only thing that has stopped is the eye drainage. Pt said that her vision is still a little blurry but her eye is watering a lot so not sure if that's causing the vision issues. Pt said it's concerning her that the swelling isn't going down at all and pt has been using a warm compress on it often with no improvement. Pt question if she needs to see an eye doctor, pt goes to [facility] pt she has only seen an optician there so she question if Dr. Glori Bickers wants to put an urgent referral in for her to see an ophthalmologist there. Pt wanted to know if Dr. Glori Bickers recommends anything else or if she would pt a referral in for her to see an eye doctor. Whatever Dr. Glori Bickers recommends pt will do

## 2018-11-19 NOTE — Telephone Encounter (Signed)
Sending urgent ref to [facility] now

## 2018-11-19 NOTE — Telephone Encounter (Signed)
Appt made and patient is aware. °

## 2018-12-02 DIAGNOSIS — H00021 Hordeolum internum right upper eyelid: Secondary | ICD-10-CM | POA: Diagnosis not present

## 2018-12-16 ENCOUNTER — Encounter: Payer: Self-pay | Admitting: Orthopaedic Surgery

## 2018-12-23 DIAGNOSIS — H00021 Hordeolum internum right upper eyelid: Secondary | ICD-10-CM | POA: Diagnosis not present

## 2018-12-30 DIAGNOSIS — R69 Illness, unspecified: Secondary | ICD-10-CM | POA: Diagnosis not present

## 2019-01-06 DIAGNOSIS — H2513 Age-related nuclear cataract, bilateral: Secondary | ICD-10-CM | POA: Diagnosis not present

## 2019-01-12 DIAGNOSIS — Z0100 Encounter for examination of eyes and vision without abnormal findings: Secondary | ICD-10-CM | POA: Diagnosis not present

## 2019-01-20 ENCOUNTER — Ambulatory Visit (INDEPENDENT_AMBULATORY_CARE_PROVIDER_SITE_OTHER): Payer: Medicare HMO

## 2019-01-20 ENCOUNTER — Ambulatory Visit (INDEPENDENT_AMBULATORY_CARE_PROVIDER_SITE_OTHER): Payer: Medicare HMO | Admitting: Orthopaedic Surgery

## 2019-01-20 ENCOUNTER — Encounter: Payer: Self-pay | Admitting: Orthopaedic Surgery

## 2019-01-20 DIAGNOSIS — Z96651 Presence of right artificial knee joint: Secondary | ICD-10-CM

## 2019-01-20 DIAGNOSIS — S3992XA Unspecified injury of lower back, initial encounter: Secondary | ICD-10-CM | POA: Diagnosis not present

## 2019-01-20 DIAGNOSIS — W19XXXA Unspecified fall, initial encounter: Secondary | ICD-10-CM | POA: Diagnosis not present

## 2019-01-20 NOTE — Progress Notes (Signed)
The patient is now 1 year status post a right total knee arthroplasty.  She is several years out (at least 6 years) from her left total knee arthroplasty.  She says both knees are doing well.  She is a very active and young appearing 73 year old female.  She reports good motion and strength and some intermittent pain.  There medially.  She denies any symptoms of instability.  On examination of both knees both knees have full range of motion and are ligamentously stable.  Neither knee has an effusion.  Standing AP and lateral of the right knee shows a well-seated implant with no complicating features.  The AP view does show her left knee looks good as well.  At this point she will work on quad strengthening exercises and continue her home exercise program.  All question concerns were answered and addressed.  Follow-up can be as needed.

## 2019-01-27 ENCOUNTER — Ambulatory Visit: Payer: Medicare HMO | Admitting: Sports Medicine

## 2019-02-23 ENCOUNTER — Telehealth: Payer: Self-pay | Admitting: Family Medicine

## 2019-02-23 NOTE — Telephone Encounter (Signed)
Patient called and said she had her last Prolia shot on April 28. Patient wasn't sure if she had to contact you about scheduling to the Prolia shot.  Please call patient.

## 2019-02-27 NOTE — Telephone Encounter (Signed)
Charmaine,  Do you schedule the Prolia visits now?

## 2019-02-27 NOTE — Telephone Encounter (Signed)
Prior Auth received.  [id number] exp 08-29-19.  Discussed benefits w/pt.  Pt would owe approximately $255.  Pt scheduled.

## 2019-03-12 ENCOUNTER — Ambulatory Visit (INDEPENDENT_AMBULATORY_CARE_PROVIDER_SITE_OTHER): Payer: Medicare HMO | Admitting: *Deleted

## 2019-03-12 DIAGNOSIS — M810 Age-related osteoporosis without current pathological fracture: Secondary | ICD-10-CM | POA: Diagnosis not present

## 2019-03-12 MED ORDER — DENOSUMAB 60 MG/ML ~~LOC~~ SOSY
60.0000 mg | PREFILLED_SYRINGE | Freq: Once | SUBCUTANEOUS | Status: AC
  Administered 2019-03-12: 09:00:00 60 mg via SUBCUTANEOUS

## 2019-03-12 NOTE — Progress Notes (Signed)
Per orders of Dr. Diona Browner, injection of Prolia (densumab) 60 mg/mL given by Jabree Pernice. Patient tolerated injection well.

## 2019-05-04 ENCOUNTER — Ambulatory Visit
Admission: RE | Admit: 2019-05-04 | Discharge: 2019-05-04 | Disposition: A | Discharge status: Home / Self Care | Payer: Medicare HMO | Source: Ambulatory Visit | Attending: Family Medicine | Admitting: Family Medicine

## 2019-05-04 ENCOUNTER — Other Ambulatory Visit: Payer: Self-pay

## 2019-05-04 DIAGNOSIS — M810 Age-related osteoporosis without current pathological fracture: Secondary | ICD-10-CM

## 2019-05-04 DIAGNOSIS — Z780 Asymptomatic menopausal state: Secondary | ICD-10-CM | POA: Diagnosis not present

## 2019-05-04 DIAGNOSIS — Z1231 Encounter for screening mammogram for malignant neoplasm of breast: Secondary | ICD-10-CM

## 2019-05-04 DIAGNOSIS — M85852 Other specified disorders of bone density and structure, left thigh: Secondary | ICD-10-CM | POA: Diagnosis not present

## 2019-06-05 ENCOUNTER — Ambulatory Visit: Payer: Medicare HMO | Attending: Internal Medicine

## 2019-06-05 DIAGNOSIS — Z23 Encounter for immunization: Secondary | ICD-10-CM | POA: Insufficient documentation

## 2019-06-05 NOTE — Progress Notes (Signed)
   Covid-19 Vaccination Clinic  Name:  Yesenia Jones    MRN: [id number] DOB: Apr 20, 1946  06/05/2019  [name] was observed post Covid-19 immunization for 15 minutes without incidence. She was provided with Vaccine Information Sheet and instruction to access the V-Safe system.   [name] was instructed to call 911 with any severe reactions post vaccine: Marland Kitchen Difficulty breathing  . Swelling of your face and throat  . A fast heartbeat  . A bad rash all over your body  . Dizziness and weakness    Immunizations Administered    Name Date Dose VIS Date Route   Pfizer COVID-19 Vaccine 06/05/2019  9:05 AM 0.3 mL 04/24/2019 Intramuscular   Manufacturer: [employer], [employer]   Lot: [id number]   NDC: [address]-2

## 2019-06-25 ENCOUNTER — Ambulatory Visit: Payer: Medicare HMO | Attending: Internal Medicine

## 2019-06-25 DIAGNOSIS — Z23 Encounter for immunization: Secondary | ICD-10-CM

## 2019-06-25 NOTE — Progress Notes (Signed)
   Covid-19 Vaccination Clinic  Name:  KERRY ODONOHUE    MRN: [id number] DOB: 1945-07-21  06/25/2019  Ms. Gipe was observed post Covid-19 immunization for 15 minutes without incidence. She was provided with Vaccine Information Sheet and instruction to access the V-Safe system.   Ms. Splinter was instructed to call 911 with any severe reactions post vaccine: Marland Kitchen Difficulty breathing  . Swelling of your face and throat  . A fast heartbeat  . A bad rash all over your body  . Dizziness and weakness    Immunizations Administered    Name Date Dose VIS Date Route   Pfizer COVID-19 Vaccine 06/25/2019  2:34 PM 0.3 mL 04/24/2019 Intramuscular   Manufacturer: [employer], [employer]   Lot: [id number]   NDC: [address]-1

## 2019-07-02 ENCOUNTER — Telehealth: Payer: Self-pay | Admitting: Family Medicine

## 2019-07-02 DIAGNOSIS — E782 Mixed hyperlipidemia: Secondary | ICD-10-CM

## 2019-07-02 DIAGNOSIS — Z1321 Encounter for screening for nutritional disorder: Secondary | ICD-10-CM

## 2019-07-02 DIAGNOSIS — R7303 Prediabetes: Secondary | ICD-10-CM

## 2019-07-02 NOTE — Telephone Encounter (Signed)
-----   Message from Aquilla Solian, RT sent at 06/25/2019  1:13 PM EST ----- Regarding: Lab Orders for Friday 2.19.2021 Please place lab orders for Friday 2.19.2021, office visit for physical on Friday 2.26.2021 Thank you, Jones Bales RT(R)

## 2019-07-03 ENCOUNTER — Other Ambulatory Visit: Payer: Medicare HMO

## 2019-07-06 ENCOUNTER — Other Ambulatory Visit: Payer: Self-pay

## 2019-07-06 ENCOUNTER — Other Ambulatory Visit (INDEPENDENT_AMBULATORY_CARE_PROVIDER_SITE_OTHER): Payer: Medicare HMO

## 2019-07-06 DIAGNOSIS — E782 Mixed hyperlipidemia: Secondary | ICD-10-CM | POA: Diagnosis not present

## 2019-07-06 DIAGNOSIS — R7303 Prediabetes: Secondary | ICD-10-CM

## 2019-07-06 DIAGNOSIS — Z1321 Encounter for screening for nutritional disorder: Secondary | ICD-10-CM

## 2019-07-06 LAB — LIPID PANEL
Cholesterol: 154 mg/dL (ref 0–200)
HDL: 68.1 mg/dL (ref >39.00)
LDL Cholesterol: 72 mg/dL (ref 0–99)
NonHDL: 86.36
Total CHOL/HDL Ratio: 2
Triglycerides: 70 mg/dL (ref 0.0–149.0)
VLDL: 14 mg/dL (ref 0.0–40.0)

## 2019-07-06 LAB — COMPREHENSIVE METABOLIC PANEL
ALT: 16 U/L (ref 0–35)
AST: 15 U/L (ref 0–37)
Albumin: 4.2 g/dL (ref 3.5–5.2)
Alkaline Phosphatase: 38 U/L — ABNORMAL LOW (ref 39–117)
BUN: 16 mg/dL (ref 6–23)
CO2: 25 mEq/L (ref 19–32)
Calcium: 10.6 mg/dL — ABNORMAL HIGH (ref 8.4–10.5)
Chloride: 104 mEq/L (ref 96–112)
Creatinine, Ser: 0.82 mg/dL (ref 0.40–1.20)
GFR: 68.45 mL/min (ref >60.00)
Glucose, Bld: 107 mg/dL — ABNORMAL HIGH (ref 70–99)
Potassium: 3.9 mEq/L (ref 3.5–5.1)
Sodium: 141 mEq/L (ref 135–145)
Total Bilirubin: 0.5 mg/dL (ref 0.2–1.2)
Total Protein: 7 g/dL (ref 6.0–8.3)

## 2019-07-06 LAB — VITAMIN D 25 HYDROXY (VIT D DEFICIENCY, FRACTURES): VITD: 60.14 ng/mL (ref 30.00–100.00)

## 2019-07-06 LAB — HEMOGLOBIN A1C: Hgb A1c MFr Bld: 5.8 % (ref 4.6–6.5)

## 2019-07-07 DIAGNOSIS — R69 Illness, unspecified: Secondary | ICD-10-CM | POA: Diagnosis not present

## 2019-07-07 NOTE — Progress Notes (Signed)
No critical labs need to be addressed urgently. We will discuss labs in detail at upcoming office visit.   

## 2019-07-09 ENCOUNTER — Ambulatory Visit (INDEPENDENT_AMBULATORY_CARE_PROVIDER_SITE_OTHER): Payer: Medicare HMO

## 2019-07-09 ENCOUNTER — Ambulatory Visit: Payer: Medicare HMO

## 2019-07-09 DIAGNOSIS — Z0000 Encounter for general adult medical examination without abnormal findings: Secondary | ICD-10-CM | POA: Diagnosis not present

## 2019-07-09 NOTE — Progress Notes (Signed)
Subjective:   ARDENIA STINER is a 74 y.o. female who presents for Medicare Annual (Subsequent) preventive examination.  Review of Systems: N/A   This visit is being conducted through telemedicine via telephone at the nurse health advisor's home address due to the COVID-19 pandemic. This patient has given me verbal consent via doximity to conduct this visit, patient states they are participating from their home address. Patient and myself are on the telephone call. There is no referral for this visit. Some vital signs may be absent or patient reported.    Patient identification: identified by name, DOB, and current address   Cardiac Risk Factors include: advanced age (>107men, >87 women);dyslipidemia     Objective:     Vitals: There were no vitals taken for this visit.  There is no height or weight on file to calculate BMI.  Advanced Directives 07/09/2019 07/03/2018 01/31/2018 01/31/2018 01/23/2018 06/21/2017 12/25/2016  Does Patient Have a Medical Advance Directive? No No No No No No No  Would patient like information on creating a medical advance directive? No - Patient declined No - Patient declined No - Patient declined No - Patient declined No - Patient declined No - Patient declined No - Patient declined    Tobacco Social History   Tobacco Use  Smoking Status Never Smoker  Smokeless Tobacco Never Used     Counseling given: Not Answered   Clinical Intake:  Pre-visit preparation completed: Yes  Pain : No/denies pain     Nutritional Risks: None Diabetes: No  How often do you need to have someone help you when you read instructions, pamphlets, or other written materials from your doctor or pharmacy?: 1 - Never What is the last grade level you completed in school?: 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 Arthritis   . Hyperlipidemia    Past Surgical History:  Procedure  Laterality Date  . CHOLECYSTECTOMY    . KNEE SURGERY    . TOTAL KNEE ARTHROPLASTY Right 01/31/2018   Procedure: RIGHT TOTAL KNEE ARTHROPLASTY;  Surgeon: Kathryne Hitch, MD;  Location: [facility];  Service: Orthopedics;  Laterality: Right;  . TUBAL LIGATION     Family History  Problem Relation Age of Onset  . Stroke Mother   . Hyperlipidemia Mother   . Hyperlipidemia Father   . Cancer Maternal Aunt        breast cancer   Social History   Socioeconomic History  . Marital status: Married    Spouse name: Not on file  . Number of children: 2  . Years of education: Not on file  . Highest education level: Not on file  Occupational History  . Occupation: retired for [employer]: RETIRED  Tobacco Use  . Smoking status: Never Smoker  . Smokeless tobacco: Never Used  Substance and Sexual Activity  . Alcohol use: Yes    Alcohol/week: 7.0 standard drinks    Types: 7 Glasses of wine per week  . Drug use: No  . Sexual activity: Not on file  Other Topics Concern  . Not on file  Social History Narrative   Regular exercise-yes   Diet: fruits and veggies,water   Social Determinants of Health   Financial Resource Strain: Low Risk   . Difficulty of Paying Living Expenses: Not hard at all  Food Insecurity: No Food Insecurity  . Worried About Programme researcher, broadcasting/film/video  in the Last Year: Never true  . Ran Out of Food in the Last Year: Never true  Transportation Needs: No Transportation Needs  . Lack of Transportation (Medical): No  . Lack of Transportation (Non-Medical): No  Physical Activity: Sufficiently Active  . Days of Exercise per Week: 7 days  . Minutes of Exercise per Session: 60 min  Stress: No Stress Concern Present  . Feeling of Stress : Not at all  Social Connections:   . Frequency of Communication with Friends and Family: Not on file  . Frequency of Social Gatherings with Friends and Family: Not on file  . Attends Religious Services: Not on file  . Active Member of  Clubs or Organizations: Not on file  . Attends Banker Meetings: Not on file  . Marital Status: Not on file    Outpatient Encounter Medications as of 07/09/2019  Medication Sig  . acetaminophen (TYLENOL) 500 MG tablet Take 1,000 mg by mouth 2 (two) times daily as needed for moderate pain.  Marland Kitchen aspirin 81 MG chewable tablet Chew 1 tablet (81 mg total) by mouth 2 (two) times daily. (Patient taking differently: Chew 81 mg by mouth daily. )  . atorvastatin (LIPITOR) 20 MG tablet TAKE 1 TABLET BY MOUTH EVERYDAY AT BEDTIME  . Calcium Carbonate-Vitamin D (CALCIUM 600+D PO) Take 1 tablet by mouth 2 (two) times daily.  . Cholecalciferol (VITAMIN D-3) 1000 UNITS CAPS Take 1,000 Units by mouth daily.   Marland Kitchen denosumab (PROLIA) 60 MG/ML SOSY injection Inject 60 mg into the skin every 6 (six) months.  . dextromethorphan-guaiFENesin (MUCINEX DM) 30-600 MG 12hr tablet Take 1 tablet by mouth 2 (two) times daily as needed for cough.  . diphenhydramine-acetaminophen (TYLENOL PM) 25-500 MG TABS tablet Take 1 tablet by mouth at bedtime as needed (sleep).  . gabapentin (NEURONTIN) 600 MG tablet Take 1 tablet (600 mg total) by mouth 3 (three) times daily.  Marland Kitchen ibuprofen (ADVIL,MOTRIN) 200 MG tablet Take 600 mg by mouth 2 (two) times daily as needed for headache or moderate pain.  . Multiple Vitamins-Minerals (CENTRUM SILVER ADULT 50+ PO) Take 1 tablet by mouth daily.  . Psyllium (METAMUCIL FIBER PO) Take 1 Dose by mouth every evening.  . methocarbamol (ROBAXIN) 500 MG tablet TAKE 1 TABLET (500 MG TOTAL) BY MOUTH EVERY 6 (SIX) HOURS AS NEEDED FOR MUSCLE SPASMS.  Marland Kitchen ondansetron (ZOFRAN ODT) 4 MG disintegrating tablet Take 1 tablet (4 mg total) by mouth every 8 (eight) hours as needed for nausea or vomiting.  . pyridOXINE (VITAMIN B-6) 100 MG tablet Take 100 mg by mouth daily.  Marland Kitchen sulfamethoxazole-trimethoprim (BACTRIM DS) 800-160 MG tablet Take 1 tablet by mouth 2 (two) times daily. (Patient not taking: Reported on  07/09/2019)  . trimethoprim-polymyxin b (POLYTRIM) ophthalmic solution Place 1 drop into the right eye every 6 (six) hours.   Facility-Administered Encounter Medications as of 07/09/2019  Medication  . denosumab (PROLIA) injection 60 mg    Activities of Daily Living In your present state of health, do you have any difficulty performing the following activities: 07/09/2019  Hearing? Y  Comment wears hearing aids  Vision? N  Difficulty concentrating or making decisions? N  Walking or climbing stairs? N  Dressing or bathing? N  Doing errands, shopping? N  Preparing Food and eating ? N  Using the Toilet? N  In the past six months, have you accidently leaked urine? Y  Comment wears a liner sometimes  Do you have problems with loss of bowel  control? N  Managing your Medications? N  Managing your Finances? N  Housekeeping or managing your Housekeeping? N  Some recent data might be hidden    Patient Care Team: Jinny Sanders, MD as PCP - General (Family Medicine)    Assessment:   This is a routine wellness examination for Cailyn.  Exercise Activities and Dietary recommendations Current Exercise Habits: Home exercise routine, Type of exercise: walking, Time (Minutes): 60, Frequency (Times/Week): 7, Weekly Exercise (Minutes/Week): 420, Intensity: Moderate, Exercise limited by: None identified  Goals    . Increase physical activity     Starting 07/03/2018, I will continue to exercise for 45-60 minutes 5-6 days per week.     . Patient Stated     07/09/2019, I will continue to walk 1 mile everyday and exercise on my treadmill 5 days a week.        Fall Risk Fall Risk  07/09/2019 07/03/2018 06/21/2017 12/25/2016 06/19/2016  Falls in the past year? 1 0 Yes Yes Yes  Comment tripped over throw rug - accidental fall after tripping over curb; sprain to left ankle - -  Number falls in past yr: 0 - 1 1 1   Injury with Fall? 1 - Yes Yes Yes  Comment hurt shoulder and knee - - - -  Risk for fall  due to : Medication side effect - - - -  Follow up Falls evaluation completed;Falls prevention discussed - - - -   Is the patient's home free of loose throw rugs in walkways, pet beds, electrical cords, etc?   yes      Grab bars in the bathroom? yes      Handrails on the stairs?   yes      Adequate lighting?   yes  Timed Get Up and Go performed: N/A  Depression Screen PHQ 2/9 Scores 07/09/2019 07/03/2018 06/21/2017 06/19/2016  PHQ - 2 Score 0 0 0 0  PHQ- 9 Score 0 0 0 -  Exception Documentation - - - -     Cognitive Function MMSE - Mini Mental State Exam 07/09/2019 07/03/2018 06/21/2017  Orientation to time 5 5 5   Orientation to Place 5 5 5   Registration 3 3 3   Attention/ Calculation 5 0 0  Recall 3 3 3   Language- name 2 objects - 0 0  Language- repeat 1 1 1   Language- follow 3 step command - 3 3  Language- read & follow direction - 0 0  Write a sentence - 0 0  Copy design - 0 0  Total score - 20 20  Mini Cog  Mini-Cog screen was completed. Maximum score is 22. A value of 0 denotes this part of the MMSE was not completed or the patient failed this part of the Mini-Cog screening.       Immunization History  Administered Date(s) Administered  . Influenza Split 03/23/2011  . Influenza Whole 03/11/2008, 03/17/2010  . Influenza, High Dose Seasonal PF 03/03/2014, 03/15/2018, 12/30/2018  . Influenza,inj,Quad PF,6+ Mos 03/08/2015, 02/27/2016  . Influenza-Unspecified 02/11/2013, 03/01/2017  . PFIZER SARS-COV-2 Vaccination 06/05/2019, 06/25/2019  . Pneumococcal Conjugate-13 05/28/2014, 02/27/2016  . Pneumococcal Polysaccharide-23 03/25/2008, 06/21/2017  . Td 07/21/2007, 06/21/2017  . Zoster 04/12/2008    Qualifies for Shingles Vaccine: Yes  Screening Tests Health Maintenance  Topic Date Due  . MAMMOGRAM  05/03/2020  . COLONOSCOPY  05/11/2020  . TETANUS/TDAP  06/22/2027  . INFLUENZA VACCINE  Completed  . DEXA SCAN  Completed  . Hepatitis C Screening  Completed  .  PNA vac Low  Risk Adult  Completed    Cancer Screenings: Lung: Low Dose CT Chest recommended if Age 63-80 years, 30 pack-year currently smoking OR have quit w/in 15years. Patient does not qualify. Breast:  Up to date on Mammogram: Yes, completed 05/04/2019   Up to date of Bone Density/Dexa: Yes, completed 05/04/2019 Colorectal: completed 05/11/2010  Additional Screenings:  Hepatitis C Screening: 06/13/2015     Plan:   Patient will continue to walk for 1 hour everyday and ride her treadmill 5 days a week.   I have personally reviewed and noted the following in the patient's chart:   . Medical and social history . Use of alcohol, tobacco or illicit drugs  . Current medications and supplements . Functional ability and status . Nutritional status . Physical activity . Advanced directives . List of other physicians . Hospitalizations, surgeries, and ER visits in previous 12 months . Vitals . Screenings to include cognitive, depression, and falls . Referrals and appointments  In addition, I have reviewed and discussed with patient certain preventive protocols, quality metrics, and best practice recommendations. A written personalized care plan for preventive services as well as general preventive health recommendations were provided to patient.     Janalyn Shy, LPN  07/25/9700

## 2019-07-09 NOTE — Patient Instructions (Signed)
[name] , Thank you for taking time to come for your Medicare Wellness Visit. I appreciate your ongoing commitment to your health goals. Please review the following plan we discussed and let me know if I can assist you in the future.   Screening recommendations/referrals: Colonoscopy: Up to date, completed 05/11/2010 Mammogram: Up to date, completed 05/04/2019 Bone Density: Up to date, completed 05/04/2019 Recommended yearly ophthalmology/optometry visit for glaucoma screening and checkup Recommended yearly dental visit for hygiene and checkup  Vaccinations: Influenza vaccine: Up to date, completed 12/30/2018 Pneumococcal vaccine: Completed series Tdap vaccine: Up to date, completed 06/21/2017 Shingles vaccine: discussed    Advanced directives: Advance directive discussed with you today. Even though you declined this today please call our office should you change your mind and we can give you the proper paperwork for you to fill out.  Conditions/risks identified: hyperlipidemia  Next appointment: 07/10/2019 @ 8:40 am    Preventive Care 65 Years and Older, Female Preventive care refers to lifestyle choices and visits with your health care provider that can promote health and wellness. What does preventive care include?  A yearly physical exam. This is also called an annual well check.  Dental exams once or twice a year.  Routine eye exams. Ask your health care provider how often you should have your eyes checked.  Personal lifestyle choices, including:  Daily care of your teeth and gums.  Regular physical activity.  Eating a healthy diet.  Avoiding tobacco and drug use.  Limiting alcohol use.  Practicing safe sex.  Taking low-dose aspirin every day.  Taking vitamin and mineral supplements as recommended by your health care provider. What happens during an annual well check? The services and screenings done by your health care provider during your annual well check  will depend on your age, overall health, lifestyle risk factors, and family history of disease. Counseling  Your health care provider may ask you questions about your:  Alcohol use.  Tobacco use.  Drug use.  Emotional well-being.  Home and relationship well-being.  Sexual activity.  Eating habits.  History of falls.  Memory and ability to understand (cognition).  Work and work Astronomer.  Reproductive health. Screening  You may have the following tests or measurements:  Height, weight, and BMI.  Blood pressure.  Lipid and cholesterol levels. These may be checked every 5 years, or more frequently if you are over 59 years old.  Skin check.  Lung cancer screening. You may have this screening every year starting at age 46 if you have a 30-pack-year history of smoking and currently smoke or have quit within the past 15 years.  Fecal occult blood test (FOBT) of the stool. You may have this test every year starting at age 21.  Flexible sigmoidoscopy or colonoscopy. You may have a sigmoidoscopy every 5 years or a colonoscopy every 10 years starting at age 26.  Hepatitis C blood test.  Hepatitis B blood test.  Sexually transmitted disease (STD) testing.  Diabetes screening. This is done by checking your blood sugar (glucose) after you have not eaten for a while (fasting). You may have this done every 1-3 years.  Bone density scan. This is done to screen for osteoporosis. You may have this done starting at age 36.  Mammogram. This may be done every 1-2 years. Talk to your health care provider about how often you should have regular mammograms. Talk with your health care provider about your test results, treatment options, and if necessary, the need  for more tests. Vaccines  Your health care provider may recommend certain vaccines, such as:  Influenza vaccine. This is recommended every year.  Tetanus, diphtheria, and acellular pertussis (Tdap, Td) vaccine. You may  need a Td booster every 10 years.  Zoster vaccine. You may need this after age 74.  Pneumococcal 13-valent conjugate (PCV13) vaccine. One dose is recommended after age 75.  Pneumococcal polysaccharide (PPSV23) vaccine. One dose is recommended after age 35. Talk to your health care provider about which screenings and vaccines you need and how often you need them. This information is not intended to replace advice given to you by your health care provider. Make sure you discuss any questions you have with your health care provider. Document Released: 05/27/2015 Document Revised: 01/18/2016 Document Reviewed: 03/01/2015 Elsevier Interactive Patient Education  2017 [place] Prevention in the Home Falls can cause injuries. They can happen to people of all ages. There are many things you can do to make your home safe and to help prevent falls. What can I do on the outside of my home?  Regularly fix the edges of walkways and driveways and fix any cracks.  Remove anything that might make you trip as you walk through a door, such as a raised step or threshold.  Trim any bushes or trees on the path to your home.  Use bright outdoor lighting.  Clear any walking paths of anything that might make someone trip, such as rocks or tools.  Regularly check to see if handrails are loose or broken. Make sure that both sides of any steps have handrails.  Any raised decks and porches should have guardrails on the edges.  Have any leaves, snow, or ice cleared regularly.  Use sand or salt on walking paths during winter.  Clean up any spills in your garage right away. This includes oil or grease spills. What can I do in the bathroom?  Use night lights.  Install grab bars by the toilet and in the tub and shower. Do not use towel bars as grab bars.  Use non-skid mats or decals in the tub or shower.  If you need to sit down in the shower, use a plastic, non-slip stool.  Keep the floor  dry. Clean up any water that spills on the floor as soon as it happens.  Remove soap buildup in the tub or shower regularly.  Attach bath mats securely with double-sided non-slip rug tape.  Do not have throw rugs and other things on the floor that can make you trip. What can I do in the bedroom?  Use night lights.  Make sure that you have a light by your bed that is easy to reach.  Do not use any sheets or blankets that are too big for your bed. They should not hang down onto the floor.  Have a firm chair that has side arms. You can use this for support while you get dressed.  Do not have throw rugs and other things on the floor that can make you trip. What can I do in the kitchen?  Clean up any spills right away.  Avoid walking on wet floors.  Keep items that you use a lot in easy-to-reach places.  If you need to reach something above you, use a strong step stool that has a grab bar.  Keep electrical cords out of the way.  Do not use floor polish or wax that makes floors slippery. If you must use wax, use  non-skid floor wax.  Do not have throw rugs and other things on the floor that can make you trip. What can I do with my stairs?  Do not leave any items on the stairs.  Make sure that there are handrails on both sides of the stairs and use them. Fix handrails that are broken or loose. Make sure that handrails are as long as the stairways.  Check any carpeting to make sure that it is firmly attached to the stairs. Fix any carpet that is loose or worn.  Avoid having throw rugs at the top or bottom of the stairs. If you do have throw rugs, attach them to the floor with carpet tape.  Make sure that you have a light switch at the top of the stairs and the bottom of the stairs. If you do not have them, ask someone to add them for you. What else can I do to help prevent falls?  Wear shoes that:  Do not have high heels.  Have rubber bottoms.  Are comfortable and fit you  well.  Are closed at the toe. Do not wear sandals.  If you use a stepladder:  Make sure that it is fully opened. Do not climb a closed stepladder.  Make sure that both sides of the stepladder are locked into place.  Ask someone to hold it for you, if possible.  Clearly mark and make sure that you can see:  Any grab bars or handrails.  First and last steps.  Where the edge of each step is.  Use tools that help you move around (mobility aids) if they are needed. These include:  Canes.  Walkers.  Scooters.  Crutches.  Turn on the lights when you go into a dark area. Replace any light bulbs as soon as they burn out.  Set up your furniture so you have a clear path. Avoid moving your furniture around.  If any of your floors are uneven, fix them.  If there are any pets around you, be aware of where they are.  Review your medicines with your doctor. Some medicines can make you feel dizzy. This can increase your chance of falling. Ask your doctor what other things that you can do to help prevent falls. This information is not intended to replace advice given to you by your health care provider. Make sure you discuss any questions you have with your health care provider. Document Released: 02/24/2009 Document Revised: 10/06/2015 Document Reviewed: 06/04/2014 Elsevier Interactive Patient Education  2017 [employer].

## 2019-07-09 NOTE — Progress Notes (Signed)
PCP notes:  Health Maintenance: No gaps noted   Abnormal Screenings: none   Patient concerns: none   Nurse concerns: Discuss allergy issues   Next PCP appt.: 07/10/2019 @ 8:40 am

## 2019-07-10 ENCOUNTER — Encounter: Payer: Self-pay | Admitting: Family Medicine

## 2019-07-10 ENCOUNTER — Ambulatory Visit (INDEPENDENT_AMBULATORY_CARE_PROVIDER_SITE_OTHER): Payer: Medicare HMO | Admitting: Family Medicine

## 2019-07-10 ENCOUNTER — Encounter: Payer: Medicare HMO | Admitting: Family Medicine

## 2019-07-10 ENCOUNTER — Other Ambulatory Visit: Payer: Self-pay

## 2019-07-10 VITALS — BP 108/70 | HR 74 | Temp 97.0°F | Ht 65.5 in | Wt 133.0 lb

## 2019-07-10 DIAGNOSIS — Z0000 Encounter for general adult medical examination without abnormal findings: Secondary | ICD-10-CM | POA: Diagnosis not present

## 2019-07-10 DIAGNOSIS — E782 Mixed hyperlipidemia: Secondary | ICD-10-CM | POA: Diagnosis not present

## 2019-07-10 DIAGNOSIS — R7303 Prediabetes: Secondary | ICD-10-CM | POA: Diagnosis not present

## 2019-07-10 DIAGNOSIS — M810 Age-related osteoporosis without current pathological fracture: Secondary | ICD-10-CM | POA: Diagnosis not present

## 2019-07-10 MED ORDER — FLUTICASONE PROPIONATE 50 MCG/ACT NA SUSP: 2.0000 | Freq: Every day | NASAL | 6 refills | Status: AC

## 2019-07-10 NOTE — Assessment & Plan Note (Signed)
LDL at goal on atorvastatin. Encouraged exercise, weight maintanance, healthy eating habits.

## 2019-07-10 NOTE — Assessment & Plan Note (Signed)
Stable control. 

## 2019-07-10 NOTE — Patient Instructions (Addendum)
For 2 weeks decrease calcium to once daily.Marland Kitchen then return to twice daily.   Preventive Care 13 Years and Older, Female Preventive care refers to lifestyle choices and visits with your health care provider that can promote health and wellness. This includes:  A yearly physical exam. This is also called an annual well check.  Regular dental and eye exams.  Immunizations.  Screening for certain conditions.  Healthy lifestyle choices, such as diet and exercise. What can I expect for my preventive care visit? Physical exam Your health care provider will check:  Height and weight. These may be used to calculate body mass index (BMI), which is a measurement that tells if you are at a healthy weight.  Heart rate and blood pressure.  Your skin for abnormal spots. Counseling Your health care provider may ask you questions about:  Alcohol, tobacco, and drug use.  Emotional well-being.  Home and relationship well-being.  Sexual activity.  Eating habits.  History of falls.  Memory and ability to understand (cognition).  Work and work Statistician.  Pregnancy and menstrual history. What immunizations do I need?  Influenza (flu) vaccine  This is recommended every year. Tetanus, diphtheria, and pertussis (Tdap) vaccine  You may need a Td booster every 10 years. Varicella (chickenpox) vaccine  You may need this vaccine if you have not already been vaccinated. Zoster (shingles) vaccine  You may need this after age 84. Pneumococcal conjugate (PCV13) vaccine  One dose is recommended after age 44. Pneumococcal polysaccharide (PPSV23) vaccine  One dose is recommended after age 4. Measles, mumps, and rubella (MMR) vaccine  You may need at least one dose of MMR if you were born in 1957 or later. You may also need a second dose. Meningococcal conjugate (MenACWY) vaccine  You may need this if you have certain conditions. Hepatitis A vaccine  You may need this if you have  certain conditions or if you travel or work in places where you may be exposed to hepatitis A. Hepatitis B vaccine  You may need this if you have certain conditions or if you travel or work in places where you may be exposed to hepatitis B. Haemophilus influenzae type b (Hib) vaccine  You may need this if you have certain conditions. You may receive vaccines as individual doses or as more than one vaccine together in one shot (combination vaccines). Talk with your health care provider about the risks and benefits of combination vaccines. What tests do I need? Blood tests  Lipid and cholesterol levels. These may be checked every 5 years, or more frequently depending on your overall health.  Hepatitis C test.  Hepatitis B test. Screening  Lung cancer screening. You may have this screening every year starting at age 61 if you have a 30-pack-year history of smoking and currently smoke or have quit within the past 15 years.  Colorectal cancer screening. All adults should have this screening starting at age 89 and continuing until age 48. Your health care provider may recommend screening at age 76 if you are at increased risk. You will have tests every 1-10 years, depending on your results and the type of screening test.  Diabetes screening. This is done by checking your blood sugar (glucose) after you have not eaten for a while (fasting). You may have this done every 1-3 years.  Mammogram. This may be done every 1-2 years. Talk with your health care provider about how often you should have regular mammograms.  BRCA-related cancer screening. This may  be done if you have a family history of breast, ovarian, tubal, or peritoneal cancers. Other tests  Sexually transmitted disease (STD) testing.  Bone density scan. This is done to screen for osteoporosis. You may have this done starting at age 66. Follow these instructions at home: Eating and drinking  Eat a diet that includes fresh fruits  and vegetables, whole grains, lean protein, and low-fat dairy products. Limit your intake of foods with high amounts of sugar, saturated fats, and salt.  Take vitamin and mineral supplements as recommended by your health care provider.  Do not drink alcohol if your health care provider tells you not to drink.  If you drink alcohol: ? Limit how much you have to 0-1 drink a day. ? Be aware of how much alcohol is in your drink. In the [place] one drink equals one 12 oz bottle of beer (355 mL), one 5 oz glass of wine (148 mL), or one 1 oz glass of hard liquor (44 mL). Lifestyle  Take daily care of your teeth and gums.  Stay active. Exercise for at least 30 minutes on 5 or more days each week.  Do not use any products that contain nicotine or tobacco, such as cigarettes, e-cigarettes, and chewing tobacco. If you need help quitting, ask your health care provider.  If you are sexually active, practice safe sex. Use a condom or other form of protection in order to prevent STIs (sexually transmitted infections).  Talk with your health care provider about taking a low-dose aspirin or statin. What's next?  Go to your health care provider once a year for a well check visit.  Ask your health care provider how often you should have your eyes and teeth checked.  Stay up to date on all vaccines. This information is not intended to replace advice given to you by your health care provider. Make sure you discuss any questions you have with your health care provider. Document Revised: 04/24/2018 Document Reviewed: 04/24/2018 Elsevier Patient Education  2020 [employer].

## 2019-07-10 NOTE — Progress Notes (Signed)
Chief Complaint  Patient presents with  . Part 2 Annual    History of Present Illness: HPI  The patient presents for complete physical and review of chronic health problems. He/She also has the following acute concerns today: none  The patient saw a LPN or RN for medicare wellness visit.  Prevention and wellness was reviewed in detail. Note reviewed and important notes copied below.  Health Maintenance: No gaps noted   Abnormal Screenings: none   07/10/19  Osteoporosis: Last checked 04/2019: There has been a statistically significant increase in BMD of Lumbar spine, and no change in BMD of Total Mean hip since prior exam dated 05/01/2017. Femur neck  T-2.4 and in osteopenia range now. On prolia. Repeat in 2 years.  Elevated Cholesterol:  LDL at goal on atorvastatin. Lab Results  Component Value Date   CHOL 154 07/06/2019   HDL 68.10 07/06/2019   LDLCALC 72 07/06/2019   LDLDIRECT 149.3 04/06/2013   TRIG 70.0 07/06/2019   CHOLHDL 2 07/06/2019  Using medications without problems: Muscle aches:  Diet compliance: moderate Exercise: walking 1 mile daily, now has home gym Other complaints:  Prediabetes  stable Lab Results  Component Value Date   HGBA1C 5.8 07/06/2019     This visit occurred during the SARS-CoV-2 public health emergency.  Safety protocols were in place, including screening questions prior to the visit, additional usage of staff PPE, and extensive cleaning of exam room while observing appropriate contact time as indicated for disinfecting solutions.   COVID 19 screen:  No recent travel or known exposure to COVID19 The patient denies respiratory symptoms of COVID 19 at this time. The importance of social distancing was discussed today.     Review of Systems  Constitutional: Negative for chills and fever.  HENT: Negative for congestion and ear pain.   Eyes: Negative for pain and redness.  Respiratory: Negative for cough and shortness of breath.    Cardiovascular: Negative for chest pain, palpitations and leg swelling.  Gastrointestinal: Negative for abdominal pain, blood in stool, constipation, diarrhea, nausea and vomiting.  Genitourinary: Negative for dysuria.  Musculoskeletal: Negative for falls and myalgias.  Skin: Negative for rash.  Neurological: Negative for dizziness.  Psychiatric/Behavioral: Negative for depression. The patient is not nervous/anxious.       Past Medical History:  Diagnosis Date  . Arthritis   . Hyperlipidemia     reports that she has never smoked. She has never used smokeless tobacco. She reports current alcohol use of about 7.0 standard drinks of alcohol per week. She reports that she does not use drugs.   Current Outpatient Medications:  .  acetaminophen (TYLENOL) 500 MG tablet, Take 1,000 mg by mouth 2 (two) times daily as needed for moderate pain., Disp: , Rfl:  .  aspirin 81 MG chewable tablet, Chew 1 tablet (81 mg total) by mouth 2 (two) times daily. (Patient taking differently: Chew 81 mg by mouth daily. ), Disp: 30 tablet, Rfl: 0 .  atorvastatin (LIPITOR) 20 MG tablet, TAKE 1 TABLET BY MOUTH EVERYDAY AT BEDTIME, Disp: 90 tablet, Rfl: 3 .  Calcium Carbonate-Vitamin D (CALCIUM 600+D PO), Take 1 tablet by mouth 2 (two) times daily., Disp: , Rfl:  .  Cholecalciferol (VITAMIN D-3) 1000 UNITS CAPS, Take 1,000 Units by mouth daily. , Disp: , Rfl:  .  denosumab (PROLIA) 60 MG/ML SOSY injection, Inject 60 mg into the skin every 6 (six) months., Disp: , Rfl:  .  dextromethorphan-guaiFENesin (MUCINEX DM) 30-600 MG 12hr tablet,  Take 1 tablet by mouth 2 (two) times daily as needed for cough., Disp: , Rfl:  .  diphenhydramine-acetaminophen (TYLENOL PM) 25-500 MG TABS tablet, Take 1 tablet by mouth at bedtime as needed (sleep)., Disp: , Rfl:  .  gabapentin (NEURONTIN) 600 MG tablet, Take 1 tablet (600 mg total) by mouth 3 (three) times daily., Disp: 270 tablet, Rfl: 3 .  ibuprofen (ADVIL,MOTRIN) 200 MG tablet,  Take 600 mg by mouth 2 (two) times daily as needed for headache or moderate pain., Disp: , Rfl:  .  Multiple Vitamins-Minerals (CENTRUM SILVER ADULT 50+ PO), Take 1 tablet by mouth daily., Disp: , Rfl:  .  Psyllium (METAMUCIL FIBER PO), Take 1 Dose by mouth every evening., Disp: , Rfl:   Current Facility-Administered Medications:  .  denosumab (PROLIA) injection 60 mg, 60 mg, Subcutaneous, Q6 months, Briunna Leicht E, MD   Observations/Objective: Blood pressure 108/70, pulse 74, temperature (!) 97 F (36.1 C), temperature source Temporal, height 5' 5.5" (1.664 m), weight 133 lb (60.3 kg), SpO2 98 %.  Physical Exam Constitutional:      General: She is not in acute distress.    Appearance: Normal appearance. She is well-developed. She is not ill-appearing or toxic-appearing.  HENT:     Head: Normocephalic.     Right Ear: Hearing, tympanic membrane, ear canal and external ear normal.     Left Ear: Hearing, tympanic membrane, ear canal and external ear normal.     Nose: Nose normal.  Eyes:     General: Lids are normal. Lids are everted, no foreign bodies appreciated.     Conjunctiva/sclera: Conjunctivae normal.     Pupils: Pupils are equal, round, and reactive to light.  Neck:     Thyroid: No thyroid mass or thyromegaly.     Vascular: No carotid bruit.     Trachea: Trachea normal.  Cardiovascular:     Rate and Rhythm: Normal rate and regular rhythm.     Heart sounds: Normal heart sounds, S1 normal and S2 normal. No murmur. No gallop.   Pulmonary:     Effort: Pulmonary effort is normal. No respiratory distress.     Breath sounds: Normal breath sounds. No wheezing, rhonchi or rales.  Abdominal:     General: Bowel sounds are normal. There is no distension or abdominal bruit.     Palpations: Abdomen is soft. There is no fluid wave or mass.     Tenderness: There is no abdominal tenderness. There is no guarding or rebound.     Hernia: No hernia is present.  Musculoskeletal:     Cervical  back: Normal range of motion and neck supple.  Lymphadenopathy:     Cervical: No cervical adenopathy.  Skin:    General: Skin is warm and dry.     Findings: No rash.  Neurological:     Mental Status: She is alert.     Cranial Nerves: No cranial nerve deficit.     Sensory: No sensory deficit.  Psychiatric:        Mood and Affect: Mood is not anxious or depressed.        Speech: Speech normal.        Behavior: Behavior normal. Behavior is cooperative.        Judgment: Judgment normal.      Assessment and Plan The patient's preventative maintenance and recommended screening tests for an annual wellness exam were reviewed in full today. Brought up to date unless services declined.  Counselled on the importance  of diet, exercise, and its role in overall health and mortality. The patient's FH and SH was reviewed, including their home life, tobacco status, and drug and alcohol status.   Vaccines: Up to date , had both COVID vaccines Colon: 04/2010 no polyps.. Repeat in 10 years. Dr. Juanda Chance.  Mammo:Last 12/2020nml. DXA: osteopenia 02/2009 on fosamax (on for 5-6 years, never diagnosed with osteoporosis)... Stopped in May 2013 when she ran out.Marland Kitchen DEXA 04/22/2013: osteoporosis, started back on fosamax. Recheck in 04/2015: worsened.. Started on prolia 05/2015 04/2017 on yr 2 prolia..Improvement in spine, none yet in hip, 04/2019 yr 4 prolia improvement I spine and now osteopenia -2.4 in hip. PAP/DVE: Previously done at [facility], but they retired. Last PAP done nml 2011, all previous were normal 3 in a row,has history of abnormal pap and crypothrapy 20 years ago, No further paps needed, plan everyfew yearDVE. Asymptomatic. Hep C:neg   Hyperlipidemia LDL at goal on atorvastatin. Encouraged exercise, weight maintanance, healthy eating habits.   Prediabetes Stable control.    Kerby Nora, MD

## 2019-07-16 ENCOUNTER — Other Ambulatory Visit: Payer: Self-pay | Admitting: Sports Medicine

## 2019-07-21 ENCOUNTER — Telehealth: Payer: Self-pay

## 2019-07-21 DIAGNOSIS — M810 Age-related osteoporosis without current pathological fracture: Secondary | ICD-10-CM

## 2019-07-21 NOTE — Telephone Encounter (Signed)
Placed a remind me to check with pt for scheduling labs and injection

## 2019-07-21 NOTE — Telephone Encounter (Signed)
Pt's last Prolia injection 03-12-19.  Next injection due 09-11-19.  PA approved.  [employer] [facility] MAYO#[id number], for 2 injections, valid 08-30-19 to 08-29-20. Pt would owe approximately $260 after insurance.

## 2019-08-13 NOTE — Addendum Note (Signed)
Addended by: Sherrie George on: 08/13/2019 03:48 PM   Modules accepted: Orders

## 2019-08-13 NOTE — Telephone Encounter (Signed)
Pt agreed and scheduled labs for 4/13 and injection 5/4.  Placed remind me to follow lab results.

## 2019-08-19 ENCOUNTER — Other Ambulatory Visit: Payer: Self-pay

## 2019-08-24 ENCOUNTER — Telehealth: Payer: Self-pay | Admitting: Family Medicine

## 2019-08-24 NOTE — Telephone Encounter (Signed)
-----   Message from [name] sent at 08/13/2019  3:11 PM EDT ----- Regarding: Lab orders for Tuesday 4.13.21 Lab orders, no f/u appt

## 2019-08-25 ENCOUNTER — Other Ambulatory Visit: Payer: Self-pay

## 2019-08-25 ENCOUNTER — Other Ambulatory Visit (INDEPENDENT_AMBULATORY_CARE_PROVIDER_SITE_OTHER): Payer: Medicare HMO

## 2019-08-25 DIAGNOSIS — M810 Age-related osteoporosis without current pathological fracture: Secondary | ICD-10-CM

## 2019-08-25 LAB — BASIC METABOLIC PANEL
BUN: 15 mg/dL (ref 6–23)
CO2: 27 mEq/L (ref 19–32)
Calcium: 9.7 mg/dL (ref 8.4–10.5)
Chloride: 102 mEq/L (ref 96–112)
Creatinine, Ser: 0.81 mg/dL (ref 0.40–1.20)
GFR: 69.4 mL/min (ref >60.00)
Glucose, Bld: 93 mg/dL (ref 70–99)
Potassium: 4.2 mEq/L (ref 3.5–5.1)
Sodium: 135 mEq/L (ref 135–145)

## 2019-09-07 DIAGNOSIS — H2513 Age-related nuclear cataract, bilateral: Secondary | ICD-10-CM | POA: Diagnosis not present

## 2019-09-15 ENCOUNTER — Ambulatory Visit (INDEPENDENT_AMBULATORY_CARE_PROVIDER_SITE_OTHER): Payer: Medicare HMO | Admitting: *Deleted

## 2019-09-15 DIAGNOSIS — M810 Age-related osteoporosis without current pathological fracture: Secondary | ICD-10-CM | POA: Diagnosis not present

## 2019-09-15 MED ORDER — DENOSUMAB 60 MG/ML ~~LOC~~ SOSY
60.0000 mg | PREFILLED_SYRINGE | Freq: Once | SUBCUTANEOUS | Status: AC
  Administered 2019-09-15: 60 mg via SUBCUTANEOUS

## 2019-09-15 NOTE — Progress Notes (Signed)
Per orders of Dr. Ermalene Searing, injection of Prolia given in the left arm SQ by Thelda Gagan M. Patient tolerated injection well.

## 2019-09-17 ENCOUNTER — Other Ambulatory Visit: Payer: Self-pay | Admitting: Family Medicine

## 2019-10-06 IMAGING — DX DG KNEE 1-2V PORT*R*
2 series · 2 of 2 positions shown · non-contrast
Comparison: None.

CLINICAL DATA: Status post right knee arthroplasty.

EXAM:
PORTABLE RIGHT KNEE - 1-2 VIEW

[knee lat (1 of 2)]
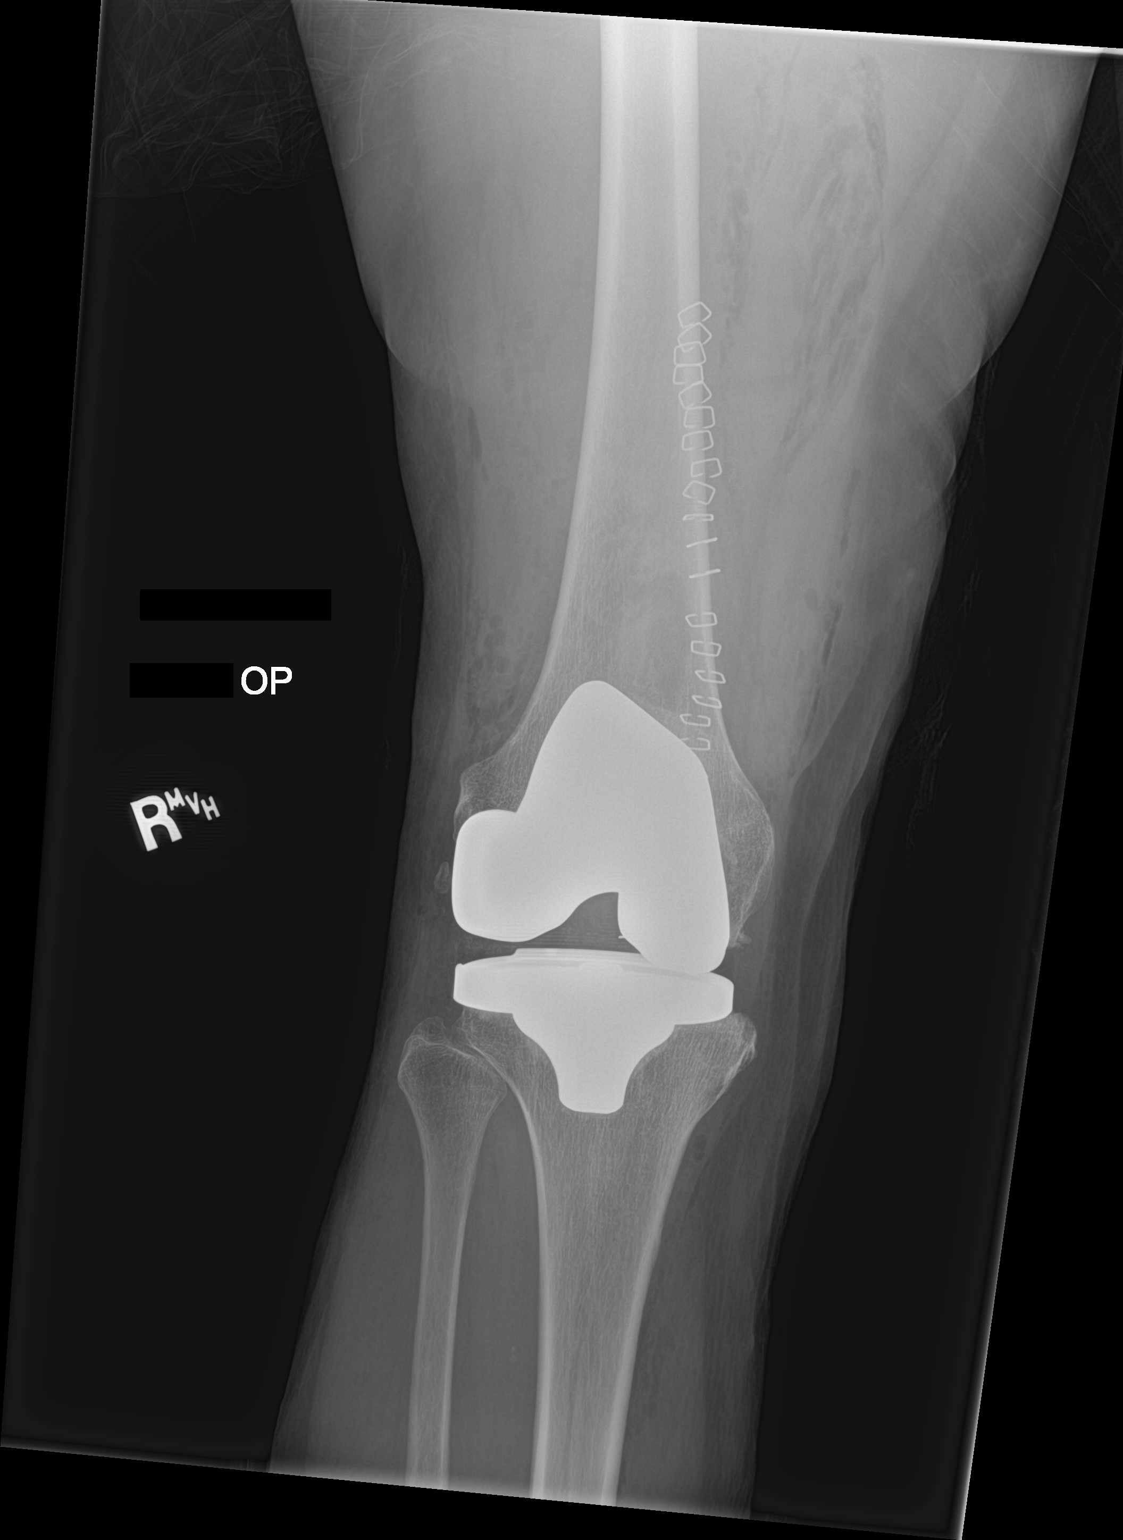

[knee lat (2 of 2)]
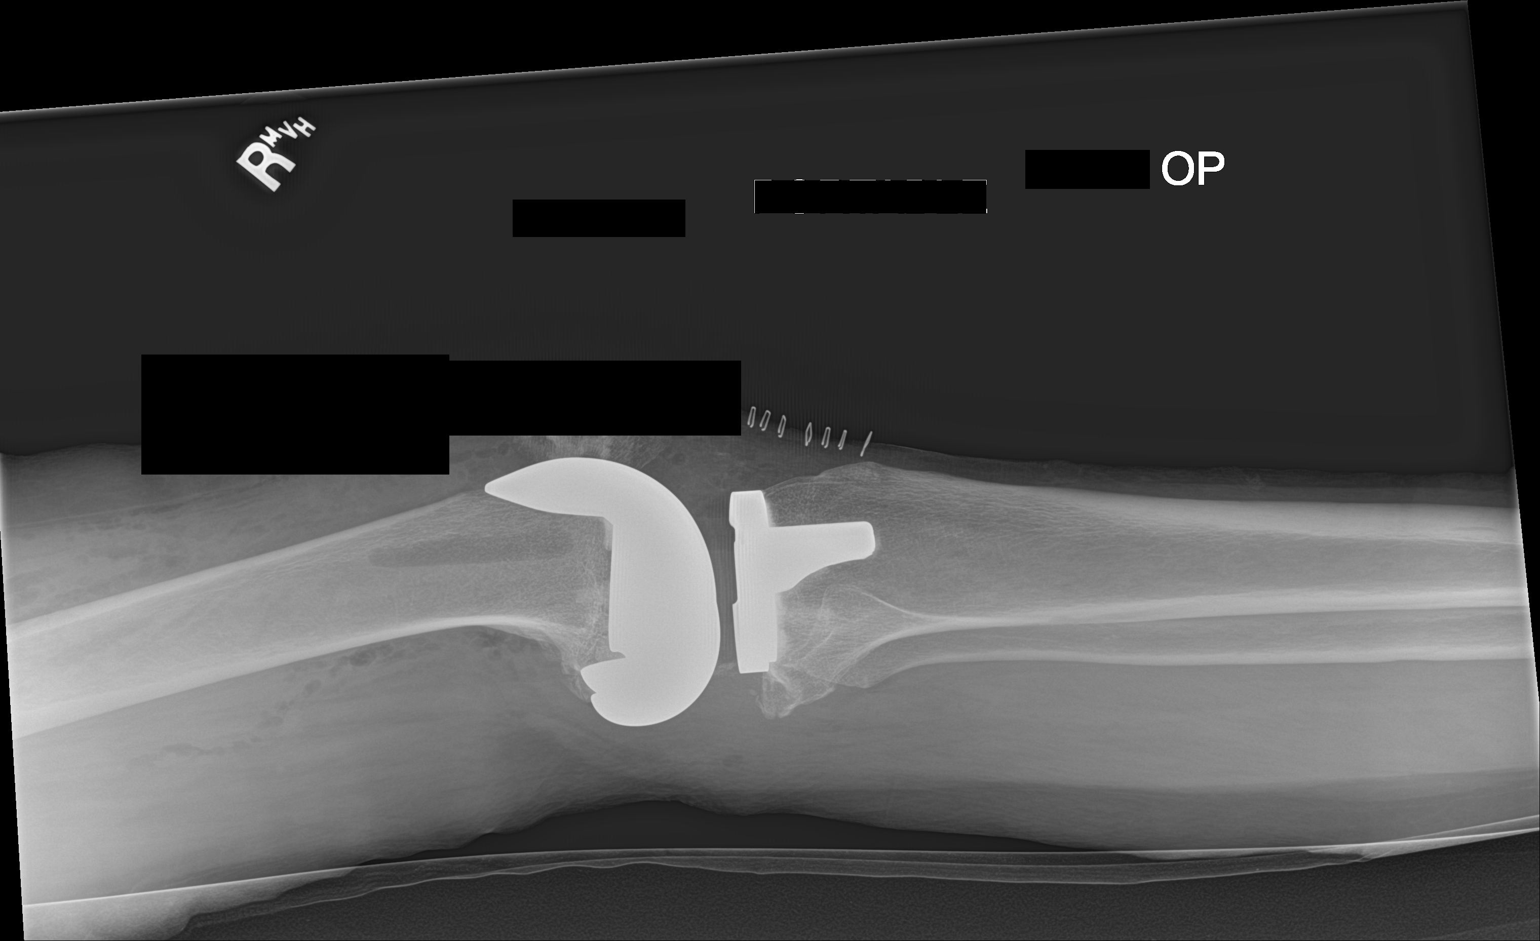

[2 of 2 positions shown; findings below may reference images not displayed]

FINDINGS: Knee prosthetic components appear well seated and well aligned. No
acute fracture or evidence of an operative complication.
IMPRESSION: Well-positioned total right knee arthroplasty.

## 2019-10-23 DIAGNOSIS — H2513 Age-related nuclear cataract, bilateral: Secondary | ICD-10-CM | POA: Diagnosis not present

## 2019-10-27 ENCOUNTER — Encounter: Payer: Self-pay | Admitting: Ophthalmology

## 2019-10-27 DIAGNOSIS — H2512 Age-related nuclear cataract, left eye: Secondary | ICD-10-CM | POA: Diagnosis not present

## 2019-10-27 DIAGNOSIS — M069 Rheumatoid arthritis, unspecified: Secondary | ICD-10-CM | POA: Diagnosis not present

## 2019-11-03 ENCOUNTER — Other Ambulatory Visit
Admission: RE | Admit: 2019-11-03 | Discharge: 2019-11-03 | Disposition: A | Discharge status: Home / Self Care | Payer: Medicare HMO | Source: Ambulatory Visit | Attending: Ophthalmology | Admitting: Ophthalmology

## 2019-11-03 ENCOUNTER — Other Ambulatory Visit: Payer: Self-pay

## 2019-11-03 DIAGNOSIS — Z01812 Encounter for preprocedural laboratory examination: Secondary | ICD-10-CM | POA: Diagnosis not present

## 2019-11-03 DIAGNOSIS — Z20822 Contact with and (suspected) exposure to covid-19: Secondary | ICD-10-CM | POA: Insufficient documentation

## 2019-11-03 LAB — SARS CORONAVIRUS 2 (TAT 6-24 HRS): SARS Coronavirus 2: NEGATIVE

## 2019-11-03 NOTE — Discharge Instructions (Signed)

## 2019-11-05 ENCOUNTER — Encounter: Payer: Self-pay | Admitting: Ophthalmology

## 2019-11-05 ENCOUNTER — Encounter
Admission: RE | Disposition: A | Discharge status: Home / Self Care | Payer: Self-pay | Source: Home / Self Care | Attending: Ophthalmology

## 2019-11-05 ENCOUNTER — Ambulatory Visit
Admission: RE | Admit: 2019-11-05 | Discharge: 2019-11-05 | Disposition: A | Discharge status: Home / Self Care | Payer: Medicare HMO | Attending: Ophthalmology | Admitting: Ophthalmology

## 2019-11-05 ENCOUNTER — Ambulatory Visit: Payer: Medicare HMO | Admitting: Anesthesiology

## 2019-11-05 DIAGNOSIS — M1990 Unspecified osteoarthritis, unspecified site: Secondary | ICD-10-CM | POA: Diagnosis not present

## 2019-11-05 DIAGNOSIS — Z96653 Presence of artificial knee joint, bilateral: Secondary | ICD-10-CM | POA: Diagnosis not present

## 2019-11-05 DIAGNOSIS — H25812 Combined forms of age-related cataract, left eye: Secondary | ICD-10-CM | POA: Diagnosis not present

## 2019-11-05 DIAGNOSIS — Z885 Allergy status to narcotic agent status: Secondary | ICD-10-CM | POA: Insufficient documentation

## 2019-11-05 DIAGNOSIS — Z79899 Other long term (current) drug therapy: Secondary | ICD-10-CM | POA: Diagnosis not present

## 2019-11-05 DIAGNOSIS — M810 Age-related osteoporosis without current pathological fracture: Secondary | ICD-10-CM | POA: Diagnosis not present

## 2019-11-05 DIAGNOSIS — Z7982 Long term (current) use of aspirin: Secondary | ICD-10-CM | POA: Diagnosis not present

## 2019-11-05 DIAGNOSIS — E7800 Pure hypercholesterolemia, unspecified: Secondary | ICD-10-CM | POA: Insufficient documentation

## 2019-11-05 DIAGNOSIS — Z880 Allergy status to penicillin: Secondary | ICD-10-CM | POA: Diagnosis not present

## 2019-11-05 DIAGNOSIS — H2512 Age-related nuclear cataract, left eye: Secondary | ICD-10-CM | POA: Insufficient documentation

## 2019-11-05 HISTORY — DX: Unspecified hearing loss, unspecified ear: H91.90

## 2019-11-05 HISTORY — PX: CATARACT EXTRACTION W/PHACO: SHX586

## 2019-11-05 HISTORY — DX: Age-related osteoporosis without current pathological fracture: M81.0

## 2019-11-05 HISTORY — DX: Other complications of anesthesia, initial encounter: T88.59XA

## 2019-11-05 HISTORY — DX: Allergy, unspecified, initial encounter: T78.40XA

## 2019-11-05 SURGERY — PHACOEMULSIFICATION, CATARACT, WITH IOL INSERTION
Anesthesia: Monitor Anesthesia Care | Site: Eye | Laterality: Left

## 2019-11-05 MED ORDER — PROVISC 10 MG/ML IO SOLN
INTRAOCULAR | Status: DC | PRN
  Administered 2019-11-05: 0.55 mL via INTRAOCULAR

## 2019-11-05 MED ORDER — NA CHONDROIT SULF-NA HYALURON 40-17 MG/ML IO SOLN
INTRAOCULAR | Status: DC | PRN
  Administered 2019-11-05: 1 mL via INTRAOCULAR

## 2019-11-05 MED ORDER — FENTANYL CITRATE (PF) 100 MCG/2ML IJ SOLN
INTRAMUSCULAR | Status: DC | PRN
  Administered 2019-11-05 (×2): 50 ug via INTRAVENOUS

## 2019-11-05 MED ORDER — LACTATED RINGERS IV SOLN: INTRAVENOUS | Status: DC

## 2019-11-05 MED ORDER — EPINEPHRINE PF 1 MG/ML IJ SOLN
INTRAOCULAR | Status: DC | PRN
  Administered 2019-11-05: 109 mL via OPHTHALMIC

## 2019-11-05 MED ORDER — TRYPAN BLUE 0.06 % OP SOLN
OPHTHALMIC | Status: DC | PRN
  Administered 2019-11-05: 0.5 mL via INTRAOCULAR

## 2019-11-05 MED ORDER — TETRACAINE HCL 0.5 % OP SOLN
1.0000 [drp] | OPHTHALMIC | Status: DC | PRN
  Administered 2019-11-05 (×3): 1 [drp] via OPHTHALMIC

## 2019-11-05 MED ORDER — ARMC OPHTHALMIC DILATING DROPS
1.0000 "application " | OPHTHALMIC | Status: DC | PRN
  Administered 2019-11-05 (×3): 1 via OPHTHALMIC

## 2019-11-05 MED ORDER — TETRACAINE 0.5 % OP SOLN OPTIME - NO CHARGE
OPHTHALMIC | Status: DC | PRN
  Administered 2019-11-05: 2 [drp] via OPHTHALMIC

## 2019-11-05 MED ORDER — BRIMONIDINE TARTRATE-TIMOLOL 0.2-0.5 % OP SOLN
OPHTHALMIC | Status: DC | PRN
  Administered 2019-11-05: 1 [drp] via OPHTHALMIC

## 2019-11-05 MED ORDER — MIDAZOLAM HCL 2 MG/2ML IJ SOLN
INTRAMUSCULAR | Status: DC | PRN
  Administered 2019-11-05: 2 mg via INTRAVENOUS

## 2019-11-05 MED ORDER — ONDANSETRON HCL 4 MG/2ML IJ SOLN
INTRAMUSCULAR | Status: DC | PRN
  Administered 2019-11-05: 4 mg via INTRAVENOUS

## 2019-11-05 MED ORDER — LIDOCAINE HCL (PF) 2 % IJ SOLN
INTRAOCULAR | Status: DC | PRN
  Administered 2019-11-05: 1 mL via INTRAOCULAR

## 2019-11-05 MED ORDER — MOXIFLOXACIN HCL 0.5 % OP SOLN
OPHTHALMIC | Status: DC | PRN
  Administered 2019-11-05: 0.2 mL via OPHTHALMIC

## 2019-11-05 SURGICAL SUPPLY — 20 items
DISSECTOR HYDRO NUCLEUS 50X22 (MISCELLANEOUS) ×8 IMPLANT
DRSG TEGADERM 2-3/8X2-3/4 SM (GAUZE/BANDAGES/DRESSINGS) ×2 IMPLANT
GLOVE BIOGEL PI IND STRL 8 (GLOVE) ×1 IMPLANT
GLOVE BIOGEL PI INDICATOR 8 (GLOVE) ×1
GOWN STRL REUS W/ TWL LRG LVL3 (GOWN DISPOSABLE) ×1 IMPLANT
GOWN STRL REUS W/ TWL XL LVL3 (GOWN DISPOSABLE) ×1 IMPLANT
GOWN STRL REUS W/TWL LRG LVL3 (GOWN DISPOSABLE) ×2
GOWN STRL REUS W/TWL XL LVL3 (GOWN DISPOSABLE) ×2
KNIFE 45D UP 2.3 (MISCELLANEOUS) ×2 IMPLANT
LENS IOL DIOP 18.5 (Intraocular Lens) ×2 IMPLANT
LENS IOL TECNIS MONO 18.5 (Intraocular Lens) IMPLANT
MARKER SKIN DUAL TIP RULER LAB (MISCELLANEOUS) ×2 IMPLANT
NDL CAPSULORHEX 25GA (NEEDLE) ×1 IMPLANT
NEEDLE CAPSULORHEX 25GA (NEEDLE) ×2 IMPLANT
PACK CATARACT (MISCELLANEOUS) ×2 IMPLANT
PACK DR. KING ARMS (PACKS) ×2 IMPLANT
PACK EYE AFTER SURG (MISCELLANEOUS) ×2 IMPLANT
SOLUTION OPHTHALMIC SALT (MISCELLANEOUS) ×2 IMPLANT
WATER STERILE IRR 250ML POUR (IV SOLUTION) ×2 IMPLANT
WIPE NON LINTING 3.25X3.25 (MISCELLANEOUS) ×2 IMPLANT

## 2019-11-05 NOTE — Anesthesia Procedure Notes (Signed)
Procedure Name: MAC Date/Time: 11/05/2019 10:10 AM Performed by: Jeannene Patella, CRNA Pre-anesthesia Checklist: Patient identified, Emergency Drugs available, Suction available, Patient being monitored and Timeout performed Patient Re-evaluated:Patient Re-evaluated prior to induction Oxygen Delivery Method: Nasal cannula

## 2019-11-05 NOTE — Anesthesia Preprocedure Evaluation (Signed)
Anesthesia Evaluation  Patient identified by MRN, date of birth, ID band Patient awake    Reviewed: Allergy & Precautions, H&P , NPO status , Patient's Chart, lab work & pertinent test results  History of Anesthesia Complications Negative for: history of anesthetic complications  Airway Mallampati: I  TM Distance: >3 FB Neck ROM: full    Dental no notable dental hx.    Pulmonary neg pulmonary ROS,    Pulmonary exam normal        Cardiovascular negative cardio ROS Normal cardiovascular exam Rhythm:regular Rate:Normal     Neuro/Psych negative neurological ROS     GI/Hepatic negative GI ROS, Neg liver ROS,   Endo/Other  negative endocrine ROS  Renal/GU negative Renal ROS  negative genitourinary   Musculoskeletal   Abdominal   Peds  Hematology negative hematology ROS (+)   Anesthesia Other Findings   Reproductive/Obstetrics                             Anesthesia Physical Anesthesia Plan  ASA: II  Anesthesia Plan: MAC   Post-op Pain Management:    Induction:   PONV Risk Score and Plan: Treatment may vary due to age or medical condition  Airway Management Planned:   Additional Equipment:   Intra-op Plan:   Post-operative Plan:   Informed Consent: I have reviewed the patients History and Physical, chart, labs and discussed the procedure including the risks, benefits and alternatives for the proposed anesthesia with the patient or authorized representative who has indicated his/her understanding and acceptance.       Plan Discussed with:   Anesthesia Plan Comments:         Anesthesia Quick Evaluation

## 2019-11-05 NOTE — Op Note (Signed)
  PREOPERATIVE DIAGNOSIS:  Nuclear sclerotic cataract of the LEFT eye.   POSTOPERATIVE DIAGNOSIS:  Nuclear sclerotic cataract of the LEFT eye.   OPERATIVE PROCEDURE: Cataract surgery OS   SURGEON:  Elliot Cousin, MD.   ANESTHESIA:  Anesthesiologist: Jolayne Panther, MD CRNA: Jinny Blossom, CRNA  1.      Managed anesthesia care. 2.     0.27ml of Shugarcaine was instilled following the paracentesis   COMPLICATIONS:  None.   TECHNIQUE:   Divide and conquer   DESCRIPTION OF PROCEDURE:  The patient was examined and consented in the preoperative holding area where the aforementioned topical anesthesia was applied to the LEFT eye and then brought back to the Operating Room where the left eye was prepped and draped in the usual sterile ophthalmic fashion and a lid speculum was placed. A paracentesis was created with the side port blade, the anterior chamber was washed out with trypan blue to stain the anterior capsule, and the anterior chamber was filled with viscoelastic. A near clear corneal incision was performed with the steel keratome. A continuous curvilinear capsulorrhexis was performed with a cystotome followed by the capsulorrhexis forceps. Hydrodissection and hydrodelineation were carried out with BSS on a blunt cannula. The lens was removed in a divide and conquer  technique and the remaining cortical material was removed with the irrigation-aspiration handpiece. The capsular bag was inflated with viscoelastic and the lens was placed in the capsular bag without complication. The remaining viscoelastic was removed from the eye with the irrigation-aspiration handpiece. The wounds were hydrated. The anterior chamber was flushed and the eye was inflated to physiologic pressure. 0.87ml Vigamox was placed in the anterior chamber. The wounds were found to be water tight. The eye was dressed with Vigamox. The patient was given protective glasses to wear throughout the day and a shield with which to  sleep tonight. The patient was also given drops with which to begin a drop regimen today and will follow-up with me in one day. Implant Name Type Inv. Item Serial No. Manufacturer Lot No. LRB No. Used Action  LENS IOL DIOP 18.5 - [id number] Intraocular Lens LENS IOL DIOP 18.5 [id number] [employer]  Left 1 Implanted    Procedure(s): CATARACT EXTRACTION PHACO AND INTRAOCULAR LENS PLACEMENT (IOC) LEFT 7.72  00:49.9 (Left)  Electronically signed: Elliot Cousin 11/05/2019 10:48 AM

## 2019-11-05 NOTE — Anesthesia Postprocedure Evaluation (Signed)
Anesthesia Post Note  Patient: Yesenia Jones  Procedure(s) Performed: CATARACT EXTRACTION PHACO AND INTRAOCULAR LENS PLACEMENT (IOC) LEFT 7.72  00:49.9 (Left Eye)     Patient location during evaluation: PACU Anesthesia Type: MAC Level of consciousness: awake and alert Pain management: pain level controlled Vital Signs Assessment: post-procedure vital signs reviewed and stable Respiratory status: spontaneous breathing Cardiovascular status: stable Anesthetic complications: no   No complications documented.  Gillian Scarce

## 2019-11-05 NOTE — Transfer of Care (Signed)
Immediate Anesthesia Transfer of Care Note  Patient: Yesenia Jones  Procedure(s) Performed: CATARACT EXTRACTION PHACO AND INTRAOCULAR LENS PLACEMENT (IOC) LEFT (Left Eye)  Patient Location: PACU  Anesthesia Type: MAC  Level of Consciousness: awake, alert  and patient cooperative  Airway and Oxygen Therapy: Patient Spontanous Breathing and Patient connected to supplemental oxygen  Post-op Assessment: Post-op Vital signs reviewed, Patient's Cardiovascular Status Stable, Respiratory Function Stable, Patent Airway and No signs of Nausea or vomiting  Post-op Vital Signs: Reviewed and stable  Complications: No complications documented.

## 2019-11-05 NOTE — H&P (Signed)
   I have reviewed the patient's H&P and agree with its findings. There have been no interval changes.  Jiraiya Mcewan MD Ophthalmology 

## 2019-11-06 ENCOUNTER — Encounter: Payer: Self-pay | Admitting: Ophthalmology

## 2019-12-01 ENCOUNTER — Encounter: Payer: Self-pay | Admitting: Ophthalmology

## 2019-12-02 DIAGNOSIS — M1991 Primary osteoarthritis, unspecified site: Secondary | ICD-10-CM | POA: Diagnosis not present

## 2019-12-02 DIAGNOSIS — H2511 Age-related nuclear cataract, right eye: Secondary | ICD-10-CM | POA: Diagnosis not present

## 2019-12-08 ENCOUNTER — Other Ambulatory Visit
Admission: RE | Admit: 2019-12-08 | Discharge: 2019-12-08 | Disposition: A | Discharge status: Home / Self Care | Payer: Medicare HMO | Source: Ambulatory Visit | Attending: Ophthalmology | Admitting: Ophthalmology

## 2019-12-08 ENCOUNTER — Other Ambulatory Visit: Payer: Self-pay

## 2019-12-08 DIAGNOSIS — Z20822 Contact with and (suspected) exposure to covid-19: Secondary | ICD-10-CM | POA: Diagnosis not present

## 2019-12-08 DIAGNOSIS — Z01812 Encounter for preprocedural laboratory examination: Secondary | ICD-10-CM | POA: Diagnosis not present

## 2019-12-08 LAB — SARS CORONAVIRUS 2 (TAT 6-24 HRS): SARS Coronavirus 2: NEGATIVE

## 2019-12-08 NOTE — Discharge Instructions (Signed)

## 2019-12-10 ENCOUNTER — Other Ambulatory Visit: Payer: Self-pay

## 2019-12-10 ENCOUNTER — Encounter: Payer: Self-pay | Admitting: Ophthalmology

## 2019-12-10 ENCOUNTER — Encounter
Admission: RE | Disposition: A | Discharge status: Home / Self Care | Payer: Self-pay | Source: Home / Self Care | Attending: Ophthalmology

## 2019-12-10 ENCOUNTER — Ambulatory Visit: Payer: Medicare HMO | Admitting: Anesthesiology

## 2019-12-10 ENCOUNTER — Ambulatory Visit
Admission: RE | Admit: 2019-12-10 | Discharge: 2019-12-10 | Disposition: A | Discharge status: Home / Self Care | Payer: Medicare HMO | Attending: Ophthalmology | Admitting: Ophthalmology

## 2019-12-10 DIAGNOSIS — H25811 Combined forms of age-related cataract, right eye: Secondary | ICD-10-CM | POA: Diagnosis not present

## 2019-12-10 DIAGNOSIS — M810 Age-related osteoporosis without current pathological fracture: Secondary | ICD-10-CM | POA: Insufficient documentation

## 2019-12-10 DIAGNOSIS — Z7982 Long term (current) use of aspirin: Secondary | ICD-10-CM | POA: Diagnosis not present

## 2019-12-10 DIAGNOSIS — Z79899 Other long term (current) drug therapy: Secondary | ICD-10-CM | POA: Insufficient documentation

## 2019-12-10 DIAGNOSIS — H9190 Unspecified hearing loss, unspecified ear: Secondary | ICD-10-CM | POA: Diagnosis not present

## 2019-12-10 DIAGNOSIS — H2511 Age-related nuclear cataract, right eye: Secondary | ICD-10-CM | POA: Diagnosis not present

## 2019-12-10 HISTORY — PX: CATARACT EXTRACTION W/PHACO: SHX586

## 2019-12-10 SURGERY — PHACOEMULSIFICATION, CATARACT, WITH IOL INSERTION
Anesthesia: Monitor Anesthesia Care | Site: Eye | Laterality: Right

## 2019-12-10 MED ORDER — LIDOCAINE HCL (PF) 2 % IJ SOLN
INTRAOCULAR | Status: DC | PRN
  Administered 2019-12-10: 1 mL via INTRAOCULAR

## 2019-12-10 MED ORDER — MIDAZOLAM HCL 2 MG/2ML IJ SOLN
INTRAMUSCULAR | Status: DC | PRN
  Administered 2019-12-10: 2 mg via INTRAVENOUS

## 2019-12-10 MED ORDER — NA CHONDROIT SULF-NA HYALURON 40-17 MG/ML IO SOLN
INTRAOCULAR | Status: DC | PRN
  Administered 2019-12-10: 1 mL via INTRAOCULAR

## 2019-12-10 MED ORDER — TETRACAINE 0.5 % OP SOLN OPTIME - NO CHARGE
OPHTHALMIC | Status: DC | PRN
  Administered 2019-12-10: 2 [drp] via OPHTHALMIC

## 2019-12-10 MED ORDER — TETRACAINE HCL 0.5 % OP SOLN
1.0000 [drp] | OPHTHALMIC | Status: DC | PRN
  Administered 2019-12-10 (×3): 1 [drp] via OPHTHALMIC

## 2019-12-10 MED ORDER — EPINEPHRINE PF 1 MG/ML IJ SOLN
INTRAOCULAR | Status: DC | PRN
  Administered 2019-12-10: 63 mL via OPHTHALMIC

## 2019-12-10 MED ORDER — FENTANYL CITRATE (PF) 100 MCG/2ML IJ SOLN
INTRAMUSCULAR | Status: DC | PRN
  Administered 2019-12-10 (×2): 50 ug via INTRAVENOUS

## 2019-12-10 MED ORDER — ACETAMINOPHEN 325 MG PO TABS: 325.0000 mg | ORAL_TABLET | Freq: Once | ORAL | Status: DC

## 2019-12-10 MED ORDER — BRIMONIDINE TARTRATE-TIMOLOL 0.2-0.5 % OP SOLN
OPHTHALMIC | Status: DC | PRN
  Administered 2019-12-10: 1 [drp] via OPHTHALMIC

## 2019-12-10 MED ORDER — ONDANSETRON HCL 4 MG/2ML IJ SOLN
INTRAMUSCULAR | Status: DC | PRN
  Administered 2019-12-10: 4 mg via INTRAVENOUS

## 2019-12-10 MED ORDER — MOXIFLOXACIN HCL 0.5 % OP SOLN
OPHTHALMIC | Status: DC | PRN
  Administered 2019-12-10: 0.2 mL via OPHTHALMIC

## 2019-12-10 MED ORDER — ARMC OPHTHALMIC DILATING DROPS
1.0000 "application " | OPHTHALMIC | Status: DC | PRN
  Administered 2019-12-10 (×2): 1 via OPHTHALMIC

## 2019-12-10 MED ORDER — LACTATED RINGERS IV SOLN: INTRAVENOUS | Status: DC

## 2019-12-10 MED ORDER — ACETAMINOPHEN 160 MG/5ML PO SOLN: 325.0000 mg | Freq: Once | ORAL | Status: DC

## 2019-12-10 MED ORDER — PROVISC 10 MG/ML IO SOLN
INTRAOCULAR | Status: DC | PRN
  Administered 2019-12-10: 0.55 mL via INTRAOCULAR

## 2019-12-10 SURGICAL SUPPLY — 20 items
DISSECTOR HYDRO NUCLEUS 50X22 (MISCELLANEOUS) ×8 IMPLANT
DRSG TEGADERM 2-3/8X2-3/4 SM (GAUZE/BANDAGES/DRESSINGS) ×2 IMPLANT
GLOVE BIOGEL PI IND STRL 8 (GLOVE) ×1 IMPLANT
GLOVE BIOGEL PI INDICATOR 8 (GLOVE) ×1
GOWN STRL REUS W/ TWL LRG LVL3 (GOWN DISPOSABLE) ×1 IMPLANT
GOWN STRL REUS W/ TWL XL LVL3 (GOWN DISPOSABLE) ×1 IMPLANT
GOWN STRL REUS W/TWL LRG LVL3 (GOWN DISPOSABLE) ×2
GOWN STRL REUS W/TWL XL LVL3 (GOWN DISPOSABLE) ×2
KNIFE 45D UP 2.3 (MISCELLANEOUS) ×2 IMPLANT
LENS IOL DIOP 18.5 (Intraocular Lens) ×2 IMPLANT
LENS IOL TECNIS MONO 18.5 (Intraocular Lens) IMPLANT
MARKER SKIN DUAL TIP RULER LAB (MISCELLANEOUS) ×2 IMPLANT
NDL CAPSULORHEX 25GA (NEEDLE) ×1 IMPLANT
NEEDLE CAPSULORHEX 25GA (NEEDLE) ×2 IMPLANT
PACK CATARACT (MISCELLANEOUS) ×2 IMPLANT
PACK DR. KING ARMS (PACKS) ×2 IMPLANT
PACK EYE AFTER SURG (MISCELLANEOUS) ×2 IMPLANT
SOLUTION OPHTHALMIC SALT (MISCELLANEOUS) ×2 IMPLANT
WATER STERILE IRR 250ML POUR (IV SOLUTION) ×2 IMPLANT
WIPE NON LINTING 3.25X3.25 (MISCELLANEOUS) ×2 IMPLANT

## 2019-12-10 NOTE — Op Note (Signed)
  PREOPERATIVE DIAGNOSIS:  Nuclear sclerotic cataract of the RIGHT eye.   POSTOPERATIVE DIAGNOSIS:  Nuclear sclerotic cataract of the RIGHT eye.   OPERATIVE PROCEDURE: Cataract surgery OD   SURGEON:  Elliot Cousin, MD.   ANESTHESIA:  Anesthesiologist: Ranee Gosselin, MD CRNA: Jinny Blossom, CRNA  1.      Managed anesthesia care. 2.     0.6ml of Shugarcaine was instilled following the paracentesis   COMPLICATIONS:  None.   TECHNIQUE:   Divide and conquer   DESCRIPTION OF PROCEDURE:  The patient was examined and consented in the preoperative holding area where the aforementioned topical anesthesia was applied to the RIGHT eye and then brought back to the Operating Room where the RIGHT eye was prepped and draped in the usual sterile ophthalmic fashion and a lid speculum was placed. A paracentesis was created with the side port blade, the anterior chamber was washed out with trypan blue to stain the anterior capsule, and the anterior chamber was filled with viscoelastic. A near clear corneal incision was performed with the steel keratome. A continuous curvilinear capsulorrhexis was performed with a cystotome followed by the capsulorrhexis forceps. Hydrodissection and hydrodelineation were carried out with BSS on a blunt cannula. The lens was removed in a divide and conquer  technique and the remaining cortical material was removed with the irrigation-aspiration handpiece. The capsular bag was inflated with viscoelastic and the lens was placed in the capsular bag without complication. The remaining viscoelastic was removed from the eye with the irrigation-aspiration handpiece. The wounds were hydrated. The anterior chamber was flushed and the eye was inflated to physiologic pressure. 0.44ml Vigamox was placed in the anterior chamber. The wounds were found to be water tight. The eye was dressed with Vigamox. The patient was given protective glasses to wear throughout the day and a shield with  which to sleep tonight. The patient was also given drops with which to begin a drop regimen today and will follow-up with me in one day. Implant Name Type Inv. Item Serial No. Manufacturer Lot No. LRB No. Used Action  LENS IOL DIOP 18.5 - [id number] Intraocular Lens LENS IOL DIOP 18.5 [id number] [employer]  Right 1 Implanted    Procedure(s): CATARACT EXTRACTION PHACO AND INTRAOCULAR LENS PLACEMENT (IOC) RIGHT 7.62  00:42.2 (Right)  Electronically signed: Cailey Trigueros 12/10/2019 11:04 AM

## 2019-12-10 NOTE — H&P (Signed)
   I have reviewed the patient's H&P and agree with its findings. There have been no interval changes.  Rozalyn Osland MD Ophthalmology 

## 2019-12-10 NOTE — Anesthesia Postprocedure Evaluation (Signed)
Anesthesia Post Note  Patient: Yesenia Jones  Procedure(s) Performed: CATARACT EXTRACTION PHACO AND INTRAOCULAR LENS PLACEMENT (IOC) RIGHT 7.62  00:42.2 (Right Eye)     Patient location during evaluation: PACU Anesthesia Type: MAC Level of consciousness: awake and alert and oriented Pain management: satisfactory to patient Vital Signs Assessment: post-procedure vital signs reviewed and stable Respiratory status: spontaneous breathing, nonlabored ventilation and respiratory function stable Cardiovascular status: blood pressure returned to baseline and stable Postop Assessment: Adequate PO intake and No signs of nausea or vomiting Anesthetic complications: no   No complications documented.  Raliegh Ip

## 2019-12-10 NOTE — Anesthesia Procedure Notes (Signed)
Procedure Name: MAC Date/Time: 12/10/2019 10:38 AM Performed by: Jeannene Patella, CRNA Pre-anesthesia Checklist: Patient identified, Emergency Drugs available, Suction available, Timeout performed and Patient being monitored Patient Re-evaluated:Patient Re-evaluated prior to induction Oxygen Delivery Method: Nasal cannula Placement Confirmation: positive ETCO2

## 2019-12-10 NOTE — Anesthesia Preprocedure Evaluation (Signed)
Anesthesia Evaluation  Patient identified by MRN, date of birth, ID band Patient awake    Reviewed: Allergy & Precautions, H&P , NPO status , Patient's Chart, lab work & pertinent test results  History of Anesthesia Complications Negative for: history of anesthetic complications  Airway Mallampati: II  TM Distance: >3 FB Neck ROM: full    Dental no notable dental hx.    Pulmonary neg pulmonary ROS,    Pulmonary exam normal breath sounds clear to auscultation       Cardiovascular negative cardio ROS Normal cardiovascular exam Rhythm:regular Rate:Normal     Neuro/Psych negative neurological ROS     GI/Hepatic negative GI ROS, Neg liver ROS,   Endo/Other  negative endocrine ROS  Renal/GU negative Renal ROS  negative genitourinary   Musculoskeletal   Abdominal   Peds  Hematology negative hematology ROS (+)   Anesthesia Other Findings   Reproductive/Obstetrics                             Anesthesia Physical  Anesthesia Plan  ASA: II  Anesthesia Plan: MAC   Post-op Pain Management:    Induction:   PONV Risk Score and Plan: 2 and Treatment may vary due to age or medical condition, TIVA and Midazolam  Airway Management Planned:   Additional Equipment:   Intra-op Plan:   Post-operative Plan:   Informed Consent: I have reviewed the patients History and Physical, chart, labs and discussed the procedure including the risks, benefits and alternatives for the proposed anesthesia with the patient or authorized representative who has indicated his/her understanding and acceptance.     Dental Advisory Given  Plan Discussed with: CRNA  Anesthesia Plan Comments:         Anesthesia Quick Evaluation

## 2019-12-10 NOTE — Transfer of Care (Signed)
Immediate Anesthesia Transfer of Care Note  Patient: Yesenia Jones  Procedure(s) Performed: CATARACT EXTRACTION PHACO AND INTRAOCULAR LENS PLACEMENT (IOC) RIGHT 7.62  00:42.2 (Right Eye)  Patient Location: PACU  Anesthesia Type: MAC  Level of Consciousness: awake, alert  and patient cooperative  Airway and Oxygen Therapy: Patient Spontanous Breathing and Patient connected to supplemental oxygen  Post-op Assessment: Post-op Vital signs reviewed, Patient's Cardiovascular Status Stable, Respiratory Function Stable, Patent Airway and No signs of Nausea or vomiting  Post-op Vital Signs: Reviewed and stable  Complications: No complications documented.

## 2020-01-06 DIAGNOSIS — R69 Illness, unspecified: Secondary | ICD-10-CM | POA: Diagnosis not present

## 2020-01-07 DIAGNOSIS — H2511 Age-related nuclear cataract, right eye: Secondary | ICD-10-CM | POA: Diagnosis not present

## 2020-01-07 DIAGNOSIS — Z961 Presence of intraocular lens: Secondary | ICD-10-CM | POA: Diagnosis not present

## 2020-01-26 DIAGNOSIS — R69 Illness, unspecified: Secondary | ICD-10-CM | POA: Diagnosis not present

## 2020-02-01 ENCOUNTER — Telehealth: Payer: Self-pay

## 2020-02-01 DIAGNOSIS — M810 Age-related osteoporosis without current pathological fracture: Secondary | ICD-10-CM

## 2020-02-01 DIAGNOSIS — R69 Illness, unspecified: Secondary | ICD-10-CM | POA: Diagnosis not present

## 2020-02-01 NOTE — Telephone Encounter (Signed)
auth received from [place]. Second visit of 2. auth number [id number].  Last injection 09/15/2019 Will need labs

## 2020-02-12 DIAGNOSIS — R69 Illness, unspecified: Secondary | ICD-10-CM | POA: Diagnosis not present

## 2020-03-16 ENCOUNTER — Telehealth: Payer: Self-pay | Admitting: Family Medicine

## 2020-03-16 NOTE — Telephone Encounter (Signed)
Pt called stating it is time for her prolia injection.  She wanted to schedule

## 2020-03-18 NOTE — Telephone Encounter (Signed)
This has been documented in the last phone encounter.

## 2020-03-18 NOTE — Addendum Note (Signed)
Addended by: Sherrie George on: 03/18/2020 11:13 AM   Modules accepted: Orders

## 2020-03-18 NOTE — Telephone Encounter (Signed)
This has been taken care of and documented in the prior telephone encounter.

## 2020-03-18 NOTE — Telephone Encounter (Signed)
Last injection was actually on 4/128/21. Cost would be the same as last inj and auth is still ok.  Contacted pt and advised of cost. Pt agreed to cost. . Scheduled pt for lab on 03/24/20 and NV for prolia inj on 03/30/20. Pt verbalized understanding  Added BMP order for lab draw.

## 2020-03-22 ENCOUNTER — Other Ambulatory Visit: Payer: Self-pay | Admitting: Family Medicine

## 2020-03-22 DIAGNOSIS — I739 Peripheral vascular disease, unspecified: Secondary | ICD-10-CM

## 2020-03-24 ENCOUNTER — Other Ambulatory Visit (INDEPENDENT_AMBULATORY_CARE_PROVIDER_SITE_OTHER): Payer: Medicare HMO

## 2020-03-24 ENCOUNTER — Other Ambulatory Visit: Payer: Self-pay

## 2020-03-24 DIAGNOSIS — M810 Age-related osteoporosis without current pathological fracture: Secondary | ICD-10-CM

## 2020-03-24 LAB — BASIC METABOLIC PANEL
BUN: 17 mg/dL (ref 6–23)
CO2: 29 mEq/L (ref 19–32)
Calcium: 10.3 mg/dL (ref 8.4–10.5)
Chloride: 102 mEq/L (ref 96–112)
Creatinine, Ser: 0.9 mg/dL (ref 0.40–1.20)
GFR: 63.6 mL/min (ref >60.00)
Glucose, Bld: 100 mg/dL — ABNORMAL HIGH (ref 70–99)
Potassium: 3.8 mEq/L (ref 3.5–5.1)
Sodium: 137 mEq/L (ref 135–145)

## 2020-03-28 NOTE — Telephone Encounter (Signed)
Ca level normal and CrCl is 52.64. It is ok for pt to receive Prolia inj on 03/30/20

## 2020-03-30 ENCOUNTER — Other Ambulatory Visit: Payer: Self-pay

## 2020-03-30 ENCOUNTER — Ambulatory Visit (INDEPENDENT_AMBULATORY_CARE_PROVIDER_SITE_OTHER): Payer: Medicare HMO

## 2020-03-30 DIAGNOSIS — M810 Age-related osteoporosis without current pathological fracture: Secondary | ICD-10-CM | POA: Diagnosis not present

## 2020-03-30 MED ORDER — DENOSUMAB 60 MG/ML ~~LOC~~ SOSY
60.0000 mg | PREFILLED_SYRINGE | Freq: Once | SUBCUTANEOUS | Status: AC
  Administered 2020-03-30: 60 mg via SUBCUTANEOUS

## 2020-03-30 NOTE — Progress Notes (Signed)
Per orders of Dr. Bedsole, injection of Prolia given by Tonee Silverstein P Jennaya Pogue, CMA in Right Arm. Patient tolerated injection well.  

## 2020-05-04 ENCOUNTER — Ambulatory Visit
Admission: RE | Admit: 2020-05-04 | Discharge: 2020-05-04 | Disposition: A | Discharge status: Home / Self Care | Payer: Medicare HMO | Source: Ambulatory Visit | Attending: Family Medicine | Admitting: Family Medicine

## 2020-05-04 ENCOUNTER — Other Ambulatory Visit: Payer: Self-pay

## 2020-05-04 DIAGNOSIS — Z1231 Encounter for screening mammogram for malignant neoplasm of breast: Secondary | ICD-10-CM | POA: Diagnosis not present

## 2020-05-04 DIAGNOSIS — I739 Peripheral vascular disease, unspecified: Secondary | ICD-10-CM

## 2020-06-13 ENCOUNTER — Emergency Department (HOSPITAL_COMMUNITY)
Admission: EM | Admit: 2020-06-13 | Discharge: 2020-06-13 | Disposition: A | Discharge status: Home / Self Care | Payer: Medicare HMO | Attending: Emergency Medicine | Admitting: Emergency Medicine

## 2020-06-13 ENCOUNTER — Telehealth: Payer: Self-pay

## 2020-06-13 ENCOUNTER — Other Ambulatory Visit: Payer: Self-pay

## 2020-06-13 DIAGNOSIS — B349 Viral infection, unspecified: Secondary | ICD-10-CM | POA: Insufficient documentation

## 2020-06-13 DIAGNOSIS — Z20822 Contact with and (suspected) exposure to covid-19: Secondary | ICD-10-CM | POA: Insufficient documentation

## 2020-06-13 DIAGNOSIS — Z7982 Long term (current) use of aspirin: Secondary | ICD-10-CM | POA: Diagnosis not present

## 2020-06-13 DIAGNOSIS — E860 Dehydration: Secondary | ICD-10-CM | POA: Diagnosis not present

## 2020-06-13 DIAGNOSIS — R197 Diarrhea, unspecified: Secondary | ICD-10-CM

## 2020-06-13 DIAGNOSIS — R1084 Generalized abdominal pain: Secondary | ICD-10-CM | POA: Diagnosis not present

## 2020-06-13 DIAGNOSIS — Z96651 Presence of right artificial knee joint: Secondary | ICD-10-CM | POA: Insufficient documentation

## 2020-06-13 DIAGNOSIS — R6883 Chills (without fever): Secondary | ICD-10-CM | POA: Insufficient documentation

## 2020-06-13 LAB — COMPREHENSIVE METABOLIC PANEL
ALT: 13 U/L (ref 0–44)
AST: 18 U/L (ref 15–41)
Albumin: 3.8 g/dL (ref 3.5–5.0)
Alkaline Phosphatase: 39 U/L (ref 38–126)
Anion gap: 12 (ref 5–15)
BUN: 8 mg/dL (ref 8–23)
CO2: 21 mmol/L — ABNORMAL LOW (ref 22–32)
Calcium: 9.6 mg/dL (ref 8.9–10.3)
Chloride: 102 mmol/L (ref 98–111)
Creatinine, Ser: 0.79 mg/dL (ref 0.44–1.00)
GFR, Estimated: >60 mL/min (ref >60)
Glucose, Bld: 116 mg/dL — ABNORMAL HIGH (ref 70–99)
Potassium: 3.5 mmol/L (ref 3.5–5.1)
Sodium: 135 mmol/L (ref 135–145)
Total Bilirubin: 0.6 mg/dL (ref 0.3–1.2)
Total Protein: 6.7 g/dL (ref 6.5–8.1)

## 2020-06-13 LAB — POC SARS CORONAVIRUS 2 AG -  ED: SARS Coronavirus 2 Ag: NEGATIVE

## 2020-06-13 LAB — URINALYSIS, ROUTINE W REFLEX MICROSCOPIC
Bilirubin Urine: NEGATIVE
Glucose, UA: NEGATIVE mg/dL
Hgb urine dipstick: NEGATIVE
Ketones, ur: 5 mg/dL — AB
Leukocytes,Ua: NEGATIVE
Nitrite: NEGATIVE
Protein, ur: NEGATIVE mg/dL
Specific Gravity, Urine: 1.002 — ABNORMAL LOW (ref 1.005–1.030)
pH: 6 (ref 5.0–8.0)

## 2020-06-13 LAB — CBC
HCT: 42.2 % (ref 36.0–46.0)
Hemoglobin: 14.3 g/dL (ref 12.0–15.0)
MCH: 30.7 pg (ref 26.0–34.0)
MCHC: 33.9 g/dL (ref 30.0–36.0)
MCV: 90.6 fL (ref 80.0–100.0)
Platelets: 284 10*3/uL (ref 150–400)
RBC: 4.66 MIL/uL (ref 3.87–5.11)
RDW: 13 % (ref 11.5–15.5)
WBC: 7.1 10*3/uL (ref 4.0–10.5)
nRBC: 0 % (ref 0.0–0.2)

## 2020-06-13 LAB — LIPASE, BLOOD: Lipase: 33 U/L (ref 11–51)

## 2020-06-13 MED ORDER — LACTINEX PO CHEW: 1.0000 | CHEWABLE_TABLET | Freq: Three times a day (TID) | ORAL | 0 refills | Status: AC

## 2020-06-13 MED ORDER — SODIUM CHLORIDE 0.9 % IV BOLUS
1000.0000 mL | Freq: Once | INTRAVENOUS | Status: AC
  Administered 2020-06-13: 1000 mL via INTRAVENOUS

## 2020-06-13 MED ORDER — FAMOTIDINE IN NACL 20-0.9 MG/50ML-% IV SOLN
20.0000 mg | INTRAVENOUS | Status: AC
  Administered 2020-06-13: 20 mg via INTRAVENOUS
  Filled 2020-06-13: qty 50

## 2020-06-13 MED ORDER — METOCLOPRAMIDE HCL 5 MG/ML IJ SOLN
10.0000 mg | INTRAMUSCULAR | Status: AC
  Administered 2020-06-13: 10 mg via INTRAVENOUS
  Filled 2020-06-13: qty 2

## 2020-06-13 MED ORDER — METOCLOPRAMIDE HCL 10 MG PO TABS: 10.0000 mg | ORAL_TABLET | Freq: Three times a day (TID) | ORAL | 0 refills | Status: DC | PRN

## 2020-06-13 MED ORDER — ONDANSETRON 4 MG PO TBDP
4.0000 mg | ORAL_TABLET | Freq: Once | ORAL | Status: AC | PRN
  Administered 2020-06-13: 4 mg via ORAL
  Filled 2020-06-13: qty 1

## 2020-06-13 NOTE — Telephone Encounter (Signed)
Per chart review tab pt is at [place]. 

## 2020-06-13 NOTE — ED Triage Notes (Signed)
Pt reports covid like symptoms (congestion, cough, sore throat) onset 1/11 after getting covid booster, which have resolved. Since Saturday having abdominal pain, n/v/d. Getting up to bathroom in the middle of the night, feeling dizzy.

## 2020-06-13 NOTE — Telephone Encounter (Signed)
Noted  

## 2020-06-13 NOTE — Discharge Instructions (Signed)
Your evaluation in the ED has been reassuring today.  We believe that your nausea and diarrhea are due to a viral illness.  You have been prescribed Reglan for management of any persistent nausea in addition to Lactinex which is a probiotic.  Take this as prescribed to help relieve diarrhea.  Drink plenty of fluids to prevent dehydration.  You may return to the ED for new or concerning symptoms.

## 2020-06-13 NOTE — ED Provider Notes (Signed)
[facility] EMERGENCY DEPARTMENT Provider Note   CSN: [id number] Arrival date & time: 06/13/20  0840     History Chief Complaint  Patient presents with  . Abdominal Pain  . Emesis  . Diarrhea    Yesenia Jones is a 75 y.o. female.  75 year old female presents to the emergency department for evaluation of nausea and diarrhea.  Symptoms have been constant x3 days.  She has tried over-the-counter Pepto without improvement.  Endorses TNTC episdoes of diarrhea in the past 24 hours. Symptoms associated with generalized abdominal "discomfort". Had some chills and lightheadedness today upon standing prompting ED assessment. Patient states she has continued to tolerate PO fluids. Denies fever, vomiting, melena, hematochezia, dysuria, urinary frequency. No hx of abdominal surgeries. Received COVID booster on 05/24/20.       Past Medical History:  Diagnosis Date  . Allergies   . Arthritis   . Complication of anesthesia    slow to wake  . HOH (hard of hearing)    wearing bilateral hearing aids  . Hyperlipidemia   . Osteoporosis     Patient Active Problem List   Diagnosis Date Noted  . Conjunctivitis 11/17/2018  . Rotator cuff tendonitis, right 11/04/2018  . Status post total knee replacement, right 01/31/2018  . Plantar fasciitis 02/07/2016  . Prediabetes 06/17/2015  . Counseling regarding end of life decision making 05/28/2014  . Hearing loss 04/04/2012  . Tinnitus 04/04/2012  . Hyperlipidemia 04/14/2010  . MORTON'S NEUROMA 04/14/2010  . TALIPES CAVUS 04/14/2010  . Osteoporosis 03/17/2010  . ALLERGIC RHINITIS 08/25/2008    Past Surgical History:  Procedure Laterality Date  . CATARACT EXTRACTION W/PHACO Left 11/05/2019   Procedure: CATARACT EXTRACTION PHACO AND INTRAOCULAR LENS PLACEMENT (IOC) LEFT 7.72  00:49.9;  Surgeon: Elliot Cousin, MD;  Location: [facility];  Service: Ophthalmology;  Laterality: Left;  . CATARACT EXTRACTION W/PHACO Right  12/10/2019   Procedure: CATARACT EXTRACTION PHACO AND INTRAOCULAR LENS PLACEMENT (IOC) RIGHT 7.62  00:42.2;  Surgeon: Elliot Cousin, MD;  Location: [facility];  Service: Ophthalmology;  Laterality: Right;  . CHOLECYSTECTOMY    . KNEE SURGERY    . TOTAL KNEE ARTHROPLASTY Right 01/31/2018   Procedure: RIGHT TOTAL KNEE ARTHROPLASTY;  Surgeon: Kathryne Hitch, MD;  Location: [facility];  Service: Orthopedics;  Laterality: Right;  . TUBAL LIGATION       OB History   No obstetric history on file.     Family History  Problem Relation Age of Onset  . Stroke Mother   . Hyperlipidemia Mother   . Hyperlipidemia Father   . Cancer Maternal Aunt        breast cancer    Social History   Tobacco Use  . Smoking status: Never Smoker  . Smokeless tobacco: Never Used  Vaping Use  . Vaping Use: Never used  Substance Use Topics  . Alcohol use: Yes    Alcohol/week: 7.0 standard drinks    Types: 7 Glasses of wine per week  . Drug use: No    Home Medications Prior to Admission medications   Medication Sig Start Date End Date Taking? Authorizing Provider  lactobacillus acidophilus & bulgar (LACTINEX) chewable tablet Chew 1 tablet by mouth 3 (three) times daily with meals for 15 days. 06/13/20 06/28/20 Yes Antony Madura, PA-C  metoCLOPramide (REGLAN) 10 MG tablet Take 1 tablet (10 mg total) by mouth 3 (three) times daily as needed for nausea (headache / nausea). 06/13/20  Yes Antony Madura, PA-C  acetaminophen (  TYLENOL) 500 MG tablet Take 1,000 mg by mouth 2 (two) times daily as needed for moderate pain.    [provider]  aspirin 81 MG chewable tablet Chew 1 tablet (81 mg total) by mouth 2 (two) times daily. Patient taking differently: Chew 81 mg by mouth daily.  02/01/18   Kathryne Hitch, MD  atorvastatin (LIPITOR) 20 MG tablet TAKE 1 TABLET BY MOUTH EVERYDAY AT BEDTIME 09/17/19   Bedsole, Amy E, MD  Calcium Carbonate-Vitamin D (CALCIUM 600+D PO) Take 1 tablet by mouth 2  (two) times daily.    [provider]  Cholecalciferol (VITAMIN D-3) 1000 UNITS CAPS Take 1,000 Units by mouth daily.     [provider]  denosumab (PROLIA) 60 MG/ML SOSY injection Inject 60 mg into the skin every 6 (six) months.    [provider]  dextromethorphan-guaiFENesin (MUCINEX DM) 30-600 MG 12hr tablet Take 1 tablet by mouth 2 (two) times daily as needed for cough.    [provider]  diclofenac Sodium (VOLTAREN) 1 % GEL Apply topically 4 (four) times daily.    [provider]  diphenhydramine-acetaminophen (TYLENOL PM) 25-500 MG TABS tablet Take 1 tablet by mouth at bedtime as needed (sleep).    [provider]  fluticasone (FLONASE) 50 MCG/ACT nasal spray Place 2 sprays into both nostrils daily. 07/10/19   Bedsole, Amy E, MD  gabapentin (NEURONTIN) 600 MG tablet TAKE 1 TABLET BY MOUTH THREE TIMES A DAY 07/16/19   Enid Baas, MD  ibuprofen (ADVIL,MOTRIN) 200 MG tablet Take 600 mg by mouth 2 (two) times daily as needed for headache or moderate pain.    [provider]  Multiple Vitamins-Minerals (CENTRUM SILVER ADULT 50+ PO) Take 1 tablet by mouth daily.    [provider]  Propylene Glycol (SYSTANE BALANCE OP) Apply to eye in the morning, at noon, in the evening, and at bedtime.    [provider]  Psyllium (METAMUCIL FIBER PO) Take 1 Dose by mouth every evening.    [provider]    Allergies    Hydrocodone-acetaminophen, Oxycodone-acetaminophen, and Penicillins  Review of Systems   Review of Systems  Ten systems reviewed and are negative for acute change, except as noted in the HPI.    Physical Exam Updated Vital Signs BP 123/80   Pulse 71   Temp 98 F (36.7 C) (Oral)   Resp 13   Ht 5\' 5"  (1.651 m)   Wt 59.9 kg   SpO2 100%   BMI 21.97 kg/m   Physical Exam Vitals and nursing note reviewed.  Constitutional:      General: She is not in acute distress.    Appearance: She is  well-developed and well-nourished. She is not diaphoretic.     Comments: Nontoxic-appearing and in no distress  HENT:     Head: Normocephalic and atraumatic.  Eyes:     General: No scleral icterus.    Extraocular Movements: EOM normal.     Conjunctiva/sclera: Conjunctivae normal.  Cardiovascular:     Rate and Rhythm: Normal rate and regular rhythm.     Pulses: Normal pulses.  Pulmonary:     Effort: Pulmonary effort is normal. No respiratory distress.     Comments: Respirations even and unlabored.  Lungs clear to auscultation bilaterally. Abdominal:     Comments: Soft, nontender, nondistended abdomen.  No palpable masses or peritoneal signs.  Musculoskeletal:        General: Normal range of motion.     Cervical  back: Normal range of motion.  Skin:    General: Skin is warm and dry.     Coloration: Skin is not pale.     Findings: No erythema or rash.  Neurological:     Mental Status: She is alert and oriented to person, place, and time.     Comments: Ambulatory to the bathroom without assistance  Psychiatric:        Mood and Affect: Mood and affect normal.        Behavior: Behavior normal.     ED Results / Procedures / Treatments   Labs (all labs ordered are listed, but only abnormal results are displayed) Labs Reviewed  COMPREHENSIVE METABOLIC PANEL - Abnormal; Notable for the following components:      Result Value   CO2 21 (*)    Glucose, Bld 116 (*)    All other components within normal limits  URINALYSIS, ROUTINE W REFLEX MICROSCOPIC - Abnormal; Notable for the following components:   Color, Urine COLORLESS (*)    Specific Gravity, Urine 1.002 (*)    Ketones, ur 5 (*)    All other components within normal limits  LIPASE, BLOOD  CBC  POC SARS CORONAVIRUS 2 AG -  ED    EKG None  Radiology No results found.  Procedures Procedures   Medications Ordered in ED Medications  ondansetron (ZOFRAN-ODT) disintegrating tablet 4 mg (4 mg Oral Given 06/13/20 0910)   sodium chloride 0.9 % bolus 1,000 mL (0 mLs Intravenous Stopped 06/13/20 1156)  metoCLOPramide (REGLAN) injection 10 mg (10 mg Intravenous Given 06/13/20 1053)  famotidine (PEPCID) IVPB 20 mg premix (0 mg Intravenous Stopped 06/13/20 1130)    ED Course  I have reviewed the triage vital signs and the nursing notes.  Pertinent labs & imaging results that were available during my care of the patient were reviewed by me and considered in my medical decision making (see chart for details).  Clinical Course as of 06/13/20 1401  Mon Jun 13, 2020  1246 Continues to ambulate to bathroom without assistance. Orthstatic vital signs without hypotension. Serum bicarb borderline low; likely from GI losses/diarrhea. [KH]  1341 Tolerating PO fluids. Is feeling better. Stable for discharge. [KH]    Clinical Course User Index [KH] Darylene Price   MDM Rules/Calculators/A&P                          75 year old female presents to the emergency department for evaluation of nausea, diarrhea, lightheadedness x 3 days.  Likely viral in etiology  Suspect that her lightheadedness is related to mild dehydration from GI losses.  She has had symptomatic improvement following IV fluids, antiemetics.  Is tolerating does report some minimal periumbilical discomfort, but has no abdominal tenderness to palpation.  Abdominal exam remains benign on repeat assessments.  Vital signs and laboratory work-up reassuring.  Will discharge with supportive care instructions and PCP follow-up.  Return precautions discussed and provided. Patient discharged in stable condition with no unaddressed concerns.   Final Clinical Impression(s) / ED Diagnoses Final diagnoses:  Diarrhea, unspecified type  Mild dehydration  Viral illness    Rx / DC Orders ED Discharge Orders         Ordered    metoCLOPramide (REGLAN) 10 MG tablet  3 times daily PRN        06/13/20 1342    lactobacillus acidophilus & bulgar (LACTINEX) chewable tablet  3  times daily with meals  06/13/20 1342           Antony Madura, PA-C 06/13/20 1402    Pollyann Savoy, MD 06/13/20 [phone]

## 2020-06-13 NOTE — Telephone Encounter (Signed)
[facility] Night - Client TELEPHONE ADVICE RECORD AccessNurse Patient Name: Yesenia Jones Gender: Female DOB: 1945/12/27 Age: 75 Y 4 M 1 D Return Phone Number: [phone] (Primary) Address: [place] [address] Client Center [facility] Night - Client Client Site [facility] - Night Physician Kerby Nora - MD Contact Type Call Who Is Calling Patient / Member / Family / Caregiver Call Type Triage / Clinical Relationship To Patient Self Return Phone Number [phone] (Primary) Chief Complaint Nausea Reason for Call Symptomatic / Request for Health Information Initial Comment Caller states Sunday she started having diarrhea/ nausea, medication not helping, hasn't eaten much, and diarrhea is better but nausea is worse. She hasn't vomitted. Translation No Nurse Assessment Nurse: Thana Farr, RN, Ladona Ridgel Date/Time Lamount Cohen Time): 06/13/2020 8:06:30 AM Confirm and document reason for call. If symptomatic, describe symptoms. ---Pt started having diarrhea and nausea early Sunday morning. Have taken OTC medication and changed diet with no relief. Diarrhea has gotten better but nausea worse. Some rectal bleeding yesterday from hemorrhoids. Denies vomiting, blood in stool, difficulty breathing, chest pain, or any other symptoms at this time. Does the patient have any new or worsening symptoms? ---Yes Will a triage be completed? ---Yes Related visit to physician within the last 2 weeks? ---No Does the PT have any chronic conditions? (i.e. diabetes, asthma, this includes High risk factors for pregnancy, etc.) ---Yes List chronic conditions. ---High cholesterol Is this a behavioral health or substance abuse call? ---No Guidelines Guideline Title Affirmed Question Affirmed Notes Nurse Date/Time Lamount Cohen Time) Rectal Bleeding Pale skin (pallor) of newonset or worsening [facility] 06/13/2020 8:09:19  AM Disp. Time Lamount Cohen Time) Disposition Final User 06/13/2020 8:11:57 AM Go to ED Now Yes Thana Farr, RN, Ladona Ridgel PLEASE NOTE: All timestamps contained within this report are represented as [place] Standard Time. CONFIDENTIALTY NOTICE: This fax transmission is intended only for the addressee. It contains information that is legally privileged, confidential or otherwise protected from use or disclosure. If you are not the intended recipient, you are strictly prohibited from reviewing, disclosing, copying using or disseminating any of this information or taking any action in reliance on or regarding this information. If you have received this fax in error, please notify us immediately by telephone so that we can arrange for its return to [place]. Phone: [phone], Toll-Free: [phone], Fax: [phone] Page: 2 of 2 Call Id: [id number] Caller Disagree/Comply Comply Caller Understands Yes PreDisposition InappropriateToAsk Care Advice Given Per Guideline * It is better and safer if another adult drives instead of you. * Leave now. Drive carefully. * Go to the ED at ___________ Hospital. * You need to be seen in the Emergency Department. GO TO ED NOW: ANOTHER ADULT SHOULD DRIVE: Referrals [facility] - ED

## 2020-06-13 NOTE — ED Notes (Signed)
Pt d/c home per MD order. Discharge summary reviewed with pt, pt verbalizes understanding. Off unit via WC. No s/s of acute distress noted at discharge.    

## 2020-06-14 DIAGNOSIS — U071 COVID-19: Secondary | ICD-10-CM

## 2020-06-14 HISTORY — DX: COVID-19: U07.1

## 2020-06-16 DIAGNOSIS — M810 Age-related osteoporosis without current pathological fracture: Secondary | ICD-10-CM

## 2020-06-16 DIAGNOSIS — R7303 Prediabetes: Secondary | ICD-10-CM

## 2020-06-16 DIAGNOSIS — E782 Mixed hyperlipidemia: Secondary | ICD-10-CM

## 2020-06-27 NOTE — Telephone Encounter (Signed)
-----   Message from Aquilla Solian, RT sent at 06/20/2020  1:21 PM EST ----- Regarding: Lab Orders for Monday 2.21.2022 Please place lab orders for Monday 2.21.2022, office visit for physical on Tuesday 3.1.2022 Thank you, Jones Bales RT(R)

## 2020-07-04 ENCOUNTER — Other Ambulatory Visit: Payer: Self-pay

## 2020-07-04 ENCOUNTER — Other Ambulatory Visit (INDEPENDENT_AMBULATORY_CARE_PROVIDER_SITE_OTHER): Payer: Medicare HMO

## 2020-07-04 DIAGNOSIS — R7303 Prediabetes: Secondary | ICD-10-CM

## 2020-07-04 DIAGNOSIS — E782 Mixed hyperlipidemia: Secondary | ICD-10-CM

## 2020-07-04 DIAGNOSIS — M810 Age-related osteoporosis without current pathological fracture: Secondary | ICD-10-CM

## 2020-07-04 LAB — COMPREHENSIVE METABOLIC PANEL
ALT: 13 U/L (ref 0–35)
AST: 14 U/L (ref 0–37)
Albumin: 3.9 g/dL (ref 3.5–5.2)
Alkaline Phosphatase: 40 U/L (ref 39–117)
BUN: 13 mg/dL (ref 6–23)
CO2: 28 mEq/L (ref 19–32)
Calcium: 10.2 mg/dL (ref 8.4–10.5)
Chloride: 105 mEq/L (ref 96–112)
Creatinine, Ser: 0.78 mg/dL (ref 0.40–1.20)
GFR: 75.36 mL/min (ref >60.00)
Glucose, Bld: 100 mg/dL — ABNORMAL HIGH (ref 70–99)
Potassium: 4.1 mEq/L (ref 3.5–5.1)
Sodium: 139 mEq/L (ref 135–145)
Total Bilirubin: 0.5 mg/dL (ref 0.2–1.2)
Total Protein: 6.9 g/dL (ref 6.0–8.3)

## 2020-07-04 LAB — LIPID PANEL
Cholesterol: 150 mg/dL (ref 0–200)
HDL: 58.9 mg/dL (ref >39.00)
LDL Cholesterol: 76 mg/dL (ref 0–99)
NonHDL: 90.65
Total CHOL/HDL Ratio: 3
Triglycerides: 75 mg/dL (ref 0.0–149.0)
VLDL: 15 mg/dL (ref 0.0–40.0)

## 2020-07-04 LAB — VITAMIN D 25 HYDROXY (VIT D DEFICIENCY, FRACTURES): VITD: 74.04 ng/mL (ref 30.00–100.00)

## 2020-07-04 LAB — HEMOGLOBIN A1C: Hgb A1c MFr Bld: 5.8 % (ref 4.6–6.5)

## 2020-07-04 NOTE — Progress Notes (Signed)
No critical labs need to be addressed urgently. We will discuss labs in detail at upcoming office visit.   

## 2020-07-11 ENCOUNTER — Other Ambulatory Visit: Payer: Self-pay | Admitting: Family Medicine

## 2020-07-12 ENCOUNTER — Telehealth: Payer: Self-pay | Admitting: Family Medicine

## 2020-07-12 ENCOUNTER — Other Ambulatory Visit: Payer: Self-pay

## 2020-07-12 ENCOUNTER — Ambulatory Visit (INDEPENDENT_AMBULATORY_CARE_PROVIDER_SITE_OTHER): Payer: Medicare HMO | Admitting: Family Medicine

## 2020-07-12 ENCOUNTER — Encounter: Payer: Self-pay | Admitting: Family Medicine

## 2020-07-12 VITALS — BP 110/80 | HR 76 | Temp 98.3°F | Ht 65.5 in | Wt 131.0 lb

## 2020-07-12 DIAGNOSIS — Z0000 Encounter for general adult medical examination without abnormal findings: Secondary | ICD-10-CM | POA: Diagnosis not present

## 2020-07-12 DIAGNOSIS — E782 Mixed hyperlipidemia: Secondary | ICD-10-CM

## 2020-07-12 DIAGNOSIS — M810 Age-related osteoporosis without current pathological fracture: Secondary | ICD-10-CM

## 2020-07-12 DIAGNOSIS — R7303 Prediabetes: Secondary | ICD-10-CM

## 2020-07-12 DIAGNOSIS — Z1211 Encounter for screening for malignant neoplasm of colon: Secondary | ICD-10-CM | POA: Diagnosis not present

## 2020-07-12 MED ORDER — ASPIRIN 81 MG PO CHEW: 81.0000 mg | CHEWABLE_TABLET | Freq: Once | ORAL | 0 refills | Status: AC

## 2020-07-12 NOTE — Telephone Encounter (Signed)
This nice lady is a prolia patient that will be due for Prolia in 10/03/2020.  She is planning a trip around this dose and is worried it may not all be handles correctly and she will have to miss her trip. ( In past Prolia has not always been handled well and she has had doses postponed rescheduled etc)  Can you please call her to reassure her, schedule labs prior as well as prolia and start the insurance approval process etc.   Thanks!

## 2020-07-12 NOTE — Assessment & Plan Note (Signed)
Stable control with diet and exercise.

## 2020-07-12 NOTE — Assessment & Plan Note (Signed)
On prolia 

## 2020-07-12 NOTE — Telephone Encounter (Signed)
Last prolia inj was 03/30/21. Need to process insurance closer to when next inj is due, which will be after 09/28/20. Labs will need to be no more than one month before the injection.  Unsure how long the insurance process will take and a new PA will most likely be necessary and but unsure how long that process will take. But will check to see if it is ok to process insurance now. Will not be able to process PA until closer to time of treatment.   Advised pt benefits will be run and a PA will be started if needed. When this is finished this nurse will f/u with her to let her know cost and schedule her for labs and NV for inj after 09/28/20. Pt is requested this done now so she can schedule her vacation in May. Pt very appreciative and verbalized understanding.

## 2020-07-12 NOTE — Progress Notes (Signed)
Patient ID: Yesenia Jones, female    DOB: 1945-09-22, 75 y.o.   MRN: [id number]  This visit was conducted in person.  Temp 98.3 F (36.8 C) (Temporal)   Ht 5' 5.5" (1.664 m)   Wt 131 lb (59.4 kg)   BMI 21.47 kg/m   BP Readings from Last 3 Encounters:  07/12/20 110/80  06/13/20 123/80  12/10/19 103/83     CC:  Chief Complaint  Patient presents with  . Medicare Wellness    Subjective:   HPI: Yesenia Jones is a 75 y.o. female presenting on 07/12/2020 for Medicare Wellness No new acute issues   Had COVID in 06/13/2020.. treated with IVF. Since then  Resolving fatigue.  Appetite improved. Resolved diarrhea, but still slightly irregular .. on probiotic.   I have personally reviewed the Medicare Annual Wellness questionnaire and have noted 1. The patient's medical and social history 2. Their use of alcohol, tobacco or illicit drugs 3. Their current medications and supplements 4. The patient's functional ability including ADL's, fall risks, home safety risks and hearing or visual             impairment. 5. Diet and physical activities 6. Evidence for depression or mood disorders 7.         Updated provider list Cognitive evaluation was performed and recorded on pt medicare questionnaire form. The patients weight, height, BMI and visual acuity have been recorded in the chart  I have made referrals, counseling and provided education to the patient based review of the above and I have provided the pt with a written personalized care plan for preventive services.   Documentation of this information was scanned into the electronic record under the media tab.   Advance directives and end of life planning reviewed in detail with patient and documented in EMR. Patient given handout on advance care directives if needed. HCPOA and living will updated if needed.   Hearing Screening   125Hz  250Hz  500Hz  1000Hz  2000Hz  3000Hz  4000Hz  6000Hz  8000Hz   Right ear:           Left ear:            Comments: Wears Bilateral Hearing Aides  Vision Screening Comments: Wears Glasses-Eye Exam at 12/2019  Fall Risk  07/12/2020 07/09/2019 07/03/2018 06/21/2017 12/25/2016  Falls in the past year? 0 1 0 Yes Yes  Comment - tripped over throw rug - accidental fall after tripping over curb; sprain to left ankle -  Number falls in past yr: - 0 - 1 1  Injury with Fall? - 1 - Yes Yes  Comment - hurt shoulder and knee - - -  Risk for fall due to : - Medication side effect - - -  Follow up - Falls evaluation completed;Falls prevention discussed - - -    Flowsheet Row Office Visit from 07/12/2020 in [facility] at [facility] Total Score 0      Elevated Cholesterol: At goal on atorvastatin 20 mg daily Lab Results  Component Value Date   CHOL 150 07/04/2020   HDL 58.90 07/04/2020   LDLCALC 76 07/04/2020   LDLDIRECT 149.3 04/06/2013   TRIG 75.0 07/04/2020   CHOLHDL 3 07/04/2020  Using medications without problems: none Muscle aches:  Diet compliance: improving Exercise: returning to..  Doing treadmill 2 days a week. Other complaints:  prediabetes  Lab Results  Component Value Date   HGBA1C 5.8 07/04/2020        Relevant  past medical, surgical, family and social history reviewed and updated as indicated. Interim medical history since our last visit reviewed. Allergies and medications reviewed and updated. Outpatient Medications Prior to Visit  Medication Sig Dispense Refill  . acetaminophen (TYLENOL) 500 MG tablet Take 1,000 mg by mouth 2 (two) times daily as needed for moderate pain.    Marland Kitchen aspirin 81 MG chewable tablet Chew 1 tablet (81 mg total) by mouth 2 (two) times daily. 30 tablet 0  . atorvastatin (LIPITOR) 20 MG tablet TAKE 1 TABLET BY MOUTH EVERYDAY AT BEDTIME 90 tablet 3  . Calcium Carbonate-Vitamin D (CALCIUM 600+D PO) Take 1 tablet by mouth 2 (two) times daily.    . Cholecalciferol (VITAMIN D-3) 1000 UNITS CAPS Take 1,000 Units by mouth daily.      Marland Kitchen denosumab (PROLIA) 60 MG/ML SOSY injection Inject 60 mg into the skin every 6 (six) months.    . dextromethorphan-guaiFENesin (MUCINEX DM) 30-600 MG 12hr tablet Take 1 tablet by mouth 2 (two) times daily as needed for cough.    . diclofenac Sodium (VOLTAREN) 1 % GEL Apply topically 4 (four) times daily.    . diphenhydramine-acetaminophen (TYLENOL PM) 25-500 MG TABS tablet Take 1 tablet by mouth at bedtime as needed (sleep).    . fluticasone (FLONASE) 50 MCG/ACT nasal spray Place 2 sprays into both nostrils daily. 16 g 6  . gabapentin (NEURONTIN) 600 MG tablet TAKE 1 TABLET BY MOUTH THREE TIMES A DAY 270 tablet 3  . ibuprofen (ADVIL,MOTRIN) 200 MG tablet Take 600 mg by mouth 2 (two) times daily as needed for headache or moderate pain.    Marland Kitchen metoCLOPramide (REGLAN) 10 MG tablet Take 1 tablet (10 mg total) by mouth 3 (three) times daily as needed for nausea (headache / nausea). 20 tablet 0  . Multiple Vitamins-Minerals (CENTRUM SILVER ADULT 50+ PO) Take 1 tablet by mouth daily.    Marland Kitchen Propylene Glycol (SYSTANE BALANCE OP) Apply to eye in the morning, at noon, in the evening, and at bedtime.    . Psyllium (METAMUCIL FIBER PO) Take 1 Dose by mouth every evening.     Facility-Administered Medications Prior to Visit  Medication Dose Route Frequency Provider Last Rate Last Admin  . denosumab (PROLIA) injection 60 mg  60 mg Subcutaneous Q6 months Madilynn Montante E, MD         Per HPI unless specifically indicated in ROS section below Review of Systems  Constitutional: Negative for fatigue and fever.  HENT: Negative for congestion.   Eyes: Negative for pain.  Respiratory: Negative for cough and shortness of breath.   Cardiovascular: Negative for chest pain, palpitations and leg swelling.  Gastrointestinal: Negative for abdominal pain.  Genitourinary: Negative for dysuria and vaginal bleeding.  Musculoskeletal: Negative for back pain.  Neurological: Negative for syncope, light-headedness and headaches.   Psychiatric/Behavioral: Negative for dysphoric mood.   Objective:  Temp 98.3 F (36.8 C) (Temporal)   Ht 5' 5.5" (1.664 m)   Wt 131 lb (59.4 kg)   BMI 21.47 kg/m   Wt Readings from Last 3 Encounters:  07/12/20 131 lb (59.4 kg)  06/13/20 132 lb (59.9 kg)  12/10/19 132 lb (59.9 kg)      Physical Exam Constitutional:      General: She is not in acute distress.Vital signs are normal.     Appearance: Normal appearance. She is well-developed and well-nourished. She is not ill-appearing or toxic-appearing.  HENT:     Head: Normocephalic.     Right Ear:  Hearing, tympanic membrane, ear canal and external ear normal. Tympanic membrane is not erythematous, retracted or bulging.     Left Ear: Hearing, tympanic membrane, ear canal and external ear normal. Tympanic membrane is not erythematous, retracted or bulging.     Nose: No mucosal edema or rhinorrhea.     Right Sinus: No maxillary sinus tenderness or frontal sinus tenderness.     Left Sinus: No maxillary sinus tenderness or frontal sinus tenderness.     Mouth/Throat:     Mouth: Oropharynx is clear and moist and mucous membranes are normal.     Pharynx: Uvula midline.  Eyes:     General: Lids are normal. Lids are everted, no foreign bodies appreciated.     Extraocular Movements: EOM normal.     Conjunctiva/sclera: Conjunctivae normal.     Pupils: Pupils are equal, round, and reactive to light.  Neck:     Thyroid: No thyroid mass or thyromegaly.     Vascular: No carotid bruit.     Trachea: Trachea normal.  Cardiovascular:     Rate and Rhythm: Normal rate and regular rhythm.     Pulses: Normal pulses and intact distal pulses.     Heart sounds: Normal heart sounds, S1 normal and S2 normal. No murmur heard. No friction rub. No gallop.   Pulmonary:     Effort: Pulmonary effort is normal. No tachypnea or respiratory distress.     Breath sounds: Normal breath sounds. No decreased breath sounds, wheezing, rhonchi or rales.  Abdominal:      General: Bowel sounds are normal.     Palpations: Abdomen is soft.     Tenderness: There is no abdominal tenderness.  Musculoskeletal:     Cervical back: Normal range of motion and neck supple.  Skin:    General: Skin is warm, dry and intact.     Findings: No rash.  Neurological:     Mental Status: She is alert.  Psychiatric:        Mood and Affect: Mood is not anxious or depressed.        Speech: Speech normal.        Behavior: Behavior normal. Behavior is cooperative.        Thought Content: Thought content normal.        Cognition and Memory: Cognition and memory normal.        Judgment: Judgment normal.       Results for orders placed or performed in visit on 07/04/20  VITAMIN D 25 Hydroxy (Vit-D Deficiency, Fractures)  Result Value Ref Range   VITD 74.04 30.00 - 100.00 ng/mL  Comprehensive metabolic panel  Result Value Ref Range   Sodium 139 135 - 145 mEq/L   Potassium 4.1 3.5 - 5.1 mEq/L   Chloride 105 96 - 112 mEq/L   CO2 28 19 - 32 mEq/L   Glucose, Bld 100 (H) 70 - 99 mg/dL   BUN 13 6 - 23 mg/dL   Creatinine, Ser 3.66 0.40 - 1.20 mg/dL   Total Bilirubin 0.5 0.2 - 1.2 mg/dL   Alkaline Phosphatase 40 39 - 117 U/L   AST 14 0 - 37 U/L   ALT 13 0 - 35 U/L   Total Protein 6.9 6.0 - 8.3 g/dL   Albumin 3.9 3.5 - 5.2 g/dL   GFR 44.03 >47.42 mL/min   Calcium 10.2 8.4 - 10.5 mg/dL  Lipid panel  Result Value Ref Range   Cholesterol 150 0 - 200 mg/dL   Triglycerides 75.0  0.0 - 149.0 mg/dL   HDL 21.30 >86.57 mg/dL   VLDL 84.6 0.0 - 96.2 mg/dL   LDL Cholesterol 76 0 - 99 mg/dL   Total CHOL/HDL Ratio 3    NonHDL 90.65   Hemoglobin A1c  Result Value Ref Range   Hgb A1c MFr Bld 5.8 4.6 - 6.5 %    This visit occurred during the SARS-CoV-2 public health emergency.  Safety protocols were in place, including screening questions prior to the visit, additional usage of staff PPE, and extensive cleaning of exam room while observing appropriate contact time as indicated for  disinfecting solutions.   COVID 19 screen:  No recent travel or known exposure to COVID19 The patient denies respiratory symptoms of COVID 19 at this time. The importance of social distancing was discussed today.   Assessment and Plan   The patient's preventative maintenance and recommended screening tests for an annual wellness exam were reviewed in full today. Brought up to date unless services declined.  Counselled on the importance of diet, exercise, and its role in overall health and mortality. The patient's FH and SH was reviewed, including their home life, tobacco status, and drug and alcohol status.   Last Prolia 03/2020.. plan repeat in 09/2020  Vaccines: Up to date , had both COVID vaccines and booster, high dose flu, consider shingrix. Colon: 04/2010 no polyps.. Repeat in 10 years. Dr. Juanda Chance. DUE NOW Mammo:Last 12/2021nml. DXA: osteopenia 02/2009 on fosamax (on for 5-6 years, never diagnosed with osteoporosis)... Stopped in May 2013 when she ran out.Marland Kitchen DEXA 04/22/2013: osteoporosis, started back on fosamax. Recheck in 04/2015: worsened.. Started on prolia 05/2015 04/2017 on yr 2 prolia..Improvement in spine, none yet in hip, 04/2019 yr 4 prolia improvement I spine and now osteopenia -2.4 in hip. Repeat in 04/2021 PAP/DVE: Previously done at G[facility], but they retired. Last PAP done nml 2011, all previous were normal 3 in a row,has history of abnormal pap and crypothrapy 20 years ago, No further paps needed, plan everyfew yearDVE. Asymptomatic. Hep C:neg  Problem List Items Addressed This Visit    Hyperlipidemia (Chronic)    Stable, chronic.  Continue current medication.   Atorvastatin 20 mg daily      Relevant Medications   aspirin 81 MG chewable tablet   Osteoporosis (Chronic)    On prolia.      Prediabetes (Chronic)    Stable control with diet and exercise.       Other Visit Diagnoses    Medicare annual wellness visit, subsequent    -  Primary   Colon  cancer screening       Relevant Orders   Ambulatory referral to Gastroenterology      Kerby Nora, MD

## 2020-07-12 NOTE — Assessment & Plan Note (Signed)
Stable, chronic.  Continue current medication. ? ? ?Atorvastatin 20 mg daily ?

## 2020-07-12 NOTE — Patient Instructions (Addendum)
Plan repeat colonoscopy this year.. they should call you to set this up.  Plan Prolia  10/03/2020.Marland Kitchen Yesenia Jones will call you regarding this  Stop decongestant nasal spray and use flonase instead.. call if congestion continuing.

## 2020-07-12 NOTE — Telephone Encounter (Signed)
Benefits submitted on amagen

## 2020-07-14 ENCOUNTER — Encounter: Payer: Self-pay | Admitting: Gastroenterology

## 2020-07-25 NOTE — Telephone Encounter (Signed)
Benefits ran. PA approved. Out of pocket $280 for the patient.

## 2020-08-04 DIAGNOSIS — H903 Sensorineural hearing loss, bilateral: Secondary | ICD-10-CM | POA: Diagnosis not present

## 2020-08-05 NOTE — Addendum Note (Signed)
Addended by: Karenann Cai on: 08/05/2020 04:42 PM   Modules accepted: Orders

## 2020-08-05 NOTE — Telephone Encounter (Signed)
Spoke with patient and updated her of the $72 OOP cost. Patient verbalized understanding. Lab scheduled for 09/19/20 and NV scheduled for 5/25.

## 2020-09-07 DIAGNOSIS — H43392 Other vitreous opacities, left eye: Secondary | ICD-10-CM | POA: Diagnosis not present

## 2020-09-19 ENCOUNTER — Other Ambulatory Visit: Payer: Self-pay

## 2020-09-19 ENCOUNTER — Other Ambulatory Visit (INDEPENDENT_AMBULATORY_CARE_PROVIDER_SITE_OTHER): Payer: Medicare HMO

## 2020-09-19 DIAGNOSIS — M810 Age-related osteoporosis without current pathological fracture: Secondary | ICD-10-CM | POA: Diagnosis not present

## 2020-09-19 LAB — BASIC METABOLIC PANEL
BUN: 16 mg/dL (ref 6–23)
CO2: 27 mEq/L (ref 19–32)
Calcium: 10.6 mg/dL — ABNORMAL HIGH (ref 8.4–10.5)
Chloride: 102 mEq/L (ref 96–112)
Creatinine, Ser: 0.81 mg/dL (ref 0.40–1.20)
GFR: 71.92 mL/min (ref >60.00)
Glucose, Bld: 94 mg/dL (ref 70–99)
Potassium: 3.9 mEq/L (ref 3.5–5.1)
Sodium: 138 mEq/L (ref 135–145)

## 2020-09-21 ENCOUNTER — Ambulatory Visit (AMBULATORY_SURGERY_CENTER): Payer: Medicare HMO | Admitting: *Deleted

## 2020-09-21 ENCOUNTER — Other Ambulatory Visit: Payer: Self-pay

## 2020-09-21 VITALS — Ht 65.0 in | Wt 128.0 lb

## 2020-09-21 DIAGNOSIS — Z1211 Encounter for screening for malignant neoplasm of colon: Secondary | ICD-10-CM

## 2020-09-21 NOTE — Telephone Encounter (Signed)
Noted! Thank you

## 2020-09-21 NOTE — Telephone Encounter (Signed)
Creatinine Clearance Calculation result is 56.74 mL/min, which is under 60 and above level recommended.  Is it still ok to proceed with Prolia injection?

## 2020-09-21 NOTE — Progress Notes (Signed)
Pt verified name, DOB, address and insurance during PV today. Pt mailed instruction packet to included paper to complete and mail back to [facility] with addressed and stamped envelope, Emmi video, copy of consent form to read and not return, and instructions.PV completed over the phone. Pt encouraged to call with questions or issues.  My Chart instructions to pt as well    No egg or soy allergy known to patient  No issues with past sedation with any surgeries or procedures Patient denies ever being told they had issues or difficulty with intubation  No FH of Malignant Hyperthermia No diet pills per patient No home 02 use per patient  No blood thinners per patient  Pt denies issues with constipation - since FEB 2022 with stomach virus stools have been different - uses Metamucil daily - having loose stools and doesn't feel empty all the way  No A fib or A flutter  EMMI video to pt or via MyChart  COVID 19 guidelines implemented in PV today with Pt and RN  Pt is fully vaccinated  for Covid    Due to the COVID-19 pandemic we are asking patients to follow certain guidelines.  Pt aware of COVID protocols and [facility] guidelines

## 2020-09-21 NOTE — Telephone Encounter (Signed)
Yesenia Jones, okay to proceed.  CrCl>30

## 2020-09-29 ENCOUNTER — Ambulatory Visit (INDEPENDENT_AMBULATORY_CARE_PROVIDER_SITE_OTHER): Payer: Medicare HMO

## 2020-09-29 ENCOUNTER — Other Ambulatory Visit: Payer: Self-pay

## 2020-09-29 DIAGNOSIS — M810 Age-related osteoporosis without current pathological fracture: Secondary | ICD-10-CM

## 2020-09-29 MED ORDER — DENOSUMAB 60 MG/ML ~~LOC~~ SOSY
60.0000 mg | PREFILLED_SYRINGE | Freq: Once | SUBCUTANEOUS | Status: AC
Start: 2020-09-29 — End: 2020-09-29
  Administered 2020-09-29: 60 mg via SUBCUTANEOUS

## 2020-09-29 NOTE — Progress Notes (Signed)
Per orders of Dr. Patsy Lager in Dr. Albertina Senegal absence, injection of Prolia in L arm given by Sherrie George. Patient tolerated injection well.

## 2020-09-30 ENCOUNTER — Encounter: Payer: Self-pay | Admitting: Gastroenterology

## 2020-10-05 ENCOUNTER — Ambulatory Visit (AMBULATORY_SURGERY_CENTER): Payer: Medicare HMO | Admitting: Gastroenterology

## 2020-10-05 ENCOUNTER — Other Ambulatory Visit: Payer: Self-pay

## 2020-10-05 ENCOUNTER — Encounter: Payer: Self-pay | Admitting: Gastroenterology

## 2020-10-05 VITALS — BP 124/70 | HR 74 | Temp 98.8°F | Resp 16 | Ht 65.5 in | Wt 128.0 lb

## 2020-10-05 DIAGNOSIS — K648 Other hemorrhoids: Secondary | ICD-10-CM | POA: Diagnosis not present

## 2020-10-05 DIAGNOSIS — Z1211 Encounter for screening for malignant neoplasm of colon: Secondary | ICD-10-CM

## 2020-10-05 DIAGNOSIS — R195 Other fecal abnormalities: Secondary | ICD-10-CM | POA: Diagnosis not present

## 2020-10-05 DIAGNOSIS — K5730 Diverticulosis of large intestine without perforation or abscess without bleeding: Secondary | ICD-10-CM | POA: Diagnosis not present

## 2020-10-05 MED ORDER — SODIUM CHLORIDE 0.9 % IV SOLN: 500.0000 mL | Freq: Once | INTRAVENOUS | Status: DC

## 2020-10-05 NOTE — Progress Notes (Signed)
Pt's states no medical or surgical changes since previsit or office visit. 

## 2020-10-05 NOTE — Patient Instructions (Signed)
YOU HAD AN ENDOSCOPIC PROCEDURE TODAY AT THE [facility]:   Refer to the procedure report that was given to you for any specific questions about what was found during the examination.  If the procedure report does not answer your questions, please call your gastroenterologist to clarify.  If you requested that your care partner not be given the details of your procedure findings, then the procedure report has been included in a sealed envelope for you to review at your convenience later.  YOU SHOULD EXPECT: Some feelings of bloating in the abdomen. Passage of more gas than usual.  Walking can help get rid of the air that was put into your GI tract during the procedure and reduce the bloating. If you had a lower endoscopy (such as a colonoscopy or flexible sigmoidoscopy) you may notice spotting of blood in your stool or on the toilet paper. If you underwent a bowel prep for your procedure, you may not have a normal bowel movement for a few days.  Please Note:  You might notice some irritation and congestion in your nose or some drainage.  This is from the oxygen used during your procedure.  There is no need for concern and it should clear up in a day or so.  SYMPTOMS TO REPORT IMMEDIATELY:   Following lower endoscopy (colonoscopy or flexible sigmoidoscopy):  Excessive amounts of blood in the stool  Significant tenderness or worsening of abdominal pains  Swelling of the abdomen that is new, acute  Fever of 100F or higher  For urgent or emergent issues, a gastroenterologist can be reached at any hour by calling [phone]. Do not use MyChart messaging for urgent concerns.    DIET:  We do recommend a small meal at first, but then you may proceed to your regular diet.  Drink plenty of fluids but you should avoid alcoholic beverages for 24 hours. Please follow a High Fiber Diet (see handout given to you by your recovery nurse).  MEDICATIONS: Continue present medications.  Please  see handouts given to you by your recovery nurse.  FOLLOW UP: Consider evaluation with a colorectal surgeon if you would like to consider repair of your prolapse.  Thank you for allowing [place] to provide for your healthcare needs today.  ACTIVITY:  You should plan to take it easy for the rest of today and you should NOT DRIVE or use heavy machinery until tomorrow (because of the sedation medicines used during the test).    FOLLOW UP: Our staff will call the number listed on your records 48-72 hours following your procedure to check on you and address any questions or concerns that you may have regarding the information given to you following your procedure. If we do not reach you, we will leave a message.  We will attempt to reach you two times.  During this call, we will ask if you have developed any symptoms of COVID 19. If you develop any symptoms (ie: fever, flu-like symptoms, shortness of breath, cough etc.) before then, please call [phone].  If you test positive for Covid 19 in the 2 weeks post procedure, please call and report this information to [place].    If any biopsies were taken you will be contacted by phone or by letter within the next 1-3 weeks.  Please call us at [phone] if you have not heard about the biopsies in 3 weeks.    SIGNATURES/CONFIDENTIALITY: You and/or your care partner have signed paperwork which will be entered  into your electronic medical record.  These signatures attest to the fact that that the information above on your After Visit Summary has been reviewed and is understood.  Full responsibility of the confidentiality of this discharge information lies with you and/or your care-partner.

## 2020-10-05 NOTE — Progress Notes (Signed)
PT taken to PACU. Monitors in place. VSS. Report given to RN. 

## 2020-10-05 NOTE — Progress Notes (Signed)
Called to room to assist during endoscopic procedure.  Patient ID and intended procedure confirmed with present staff. Received instructions for my participation in the procedure from the performing physician.  

## 2020-10-05 NOTE — Op Note (Addendum)
[facility] Patient Name: Yesenia Jones Procedure Date: 10/05/2020 7:55 AM MRN: [id number] Endoscopist: Tressia Danas MD, MD Age: 75 Referring MD:  Date of Birth: 09/28/1945 Gender: Female Account #: [id number] Procedure:                Colonoscopy Indications:              Screening for colorectal malignant neoplasm,                            Incidental change in bowel habits noted after Covid                            earlier this year                           Normal colonoscopy 2011 Medicines:                Monitored Anesthesia Care Procedure:                Pre-Anesthesia Assessment:                           - Prior to the procedure, a History and Physical                            was performed, and patient medications and                            allergies were reviewed. The patient's tolerance of                            previous anesthesia was also reviewed. The risks                            and benefits of the procedure and the sedation                            options and risks were discussed with the patient.                            All questions were answered, and informed consent                            was obtained. Prior Anticoagulants: The patient has                            taken no previous anticoagulant or antiplatelet                            agents. ASA Grade Assessment: II - A patient with                            mild systemic disease. After reviewing the risks  and benefits, the patient was deemed in                            satisfactory condition to undergo the procedure.                           After obtaining informed consent, the colonoscope                            was passed under direct vision. Throughout the                            procedure, the patient's blood pressure, pulse, and                            oxygen saturations were monitored continuously. The                             Colonoscope was introduced through the anus and                            advanced to the 3 cm into the ileum. A second                            forward view of the right colon was performed. The                            colonoscopy was performed without difficulty. The                            patient tolerated the procedure well. The quality                            of the bowel preparation was good. Scope In: 8:05:27 AM Scope Out: 8:22:34 AM Scope Withdrawal Time: 0 hours 12 minutes 42 seconds  Total Procedure Duration: 0 hours 17 minutes 7 seconds  Findings:                 Rectal prolapse is present. Hemorrhoids were seen.                           A few small-mouthed diverticula were found in the                            sigmoid colon.                           The colon (entire examined portion) appeared                            normal. Biopsies were taken from the right colon                            and left colon with a cold forceps for histology.  Estimated blood loss was minimal. Complications:            No immediate complications. Impression:               - Hemorrhoids found on perianal exam.                           - Diverticulosis in the sigmoid colon. Recommendation:           - Patient has a contact number available for                            emergencies. The signs and symptoms of potential                            delayed complications were discussed with the                            patient. Return to normal activities tomorrow.                            Written discharge instructions were provided to the                            patient.                           - High fiber diet.                           - Continue present medications.                           - Await pathology results to exclude microscopic                            colitis.                           - Emerging evidence supports  eating a diet of                            fruits, vegetables, grains, calcium, and yogurt                            while reducing red meat and alcohol may reduce the                            risk of colon cancer.                           - No further screening colon recommended.                           - Consider evaluation with a colorectal surgeon if  you would like to consider repair of your prolapse.                           - Thank you for allowing me to be involved in your                            colon cancer prevention. Tressia Danas MD, MD 10/05/2020 8:29:19 AM This report has been signed electronically.

## 2020-10-07 ENCOUNTER — Telehealth: Payer: Self-pay

## 2020-10-07 NOTE — Telephone Encounter (Signed)
First attempt follow up call to pt, lm on vm 

## 2020-10-12 ENCOUNTER — Other Ambulatory Visit: Payer: Self-pay | Admitting: Sports Medicine

## 2020-10-18 ENCOUNTER — Other Ambulatory Visit: Payer: Self-pay | Admitting: Family Medicine

## 2020-10-18 MED ORDER — NITROFURANTOIN MONOHYD MACRO 100 MG PO CAPS: 100.0000 mg | ORAL_CAPSULE | Freq: Every day | ORAL | 1 refills | Status: DC

## 2020-10-21 ENCOUNTER — Telehealth: Payer: Self-pay | Admitting: Gastroenterology

## 2020-10-21 NOTE — Telephone Encounter (Signed)
Pt called inquiring about path results. °

## 2020-10-21 NOTE — Telephone Encounter (Signed)
Pt aware that Dr. Orvan Falconer is out of the office. She knows we will call her back once the results have been reviewed by Dr. Orvan Falconer.

## 2020-10-24 ENCOUNTER — Encounter: Payer: Self-pay | Admitting: Gastroenterology

## 2020-10-24 NOTE — Progress Notes (Signed)
.  res

## 2020-11-10 ENCOUNTER — Telehealth (INDEPENDENT_AMBULATORY_CARE_PROVIDER_SITE_OTHER): Payer: Medicare HMO | Admitting: Sports Medicine

## 2020-11-10 DIAGNOSIS — G5753 Tarsal tunnel syndrome, bilateral lower limbs: Secondary | ICD-10-CM | POA: Diagnosis not present

## 2020-11-10 MED ORDER — GABAPENTIN 600 MG PO TABS: 600.0000 mg | ORAL_TABLET | Freq: Three times a day (TID) | ORAL | 3 refills | Status: DC

## 2020-11-10 NOTE — Progress Notes (Addendum)
Patient agrees to a tele-visit Patient at home Doctor at [facility] office  Patient with longstanding bilateral tarsal tunnel syndrome This is likely related to her cavus feet We started her on gabapentin several years ago and finally we were able to get control of burning and tingling that occurs in both feet She had tried orthopedic inserts She had tried straps for both arches None of the's made much of a difference in the amount of tingling  Over the past year she has often been able to get by on 2 gabapentin a day rather than 3 However when she tried to wean completely off this she had lots of symptoms in both feet  She is calling in today because other things have not changed She has no new musculoskeletal problems She would like to continue the gabapentin and will see me if this is not working   Impression Tarsal tunnel syndrome with radiculopathy bilaterally  Plan Continue to use foot support but we will keep her on gabapentin 2-3 times daily to control her neuropathic symptoms  Addendum: This patient chose a telephone only visit. Time spent total 18 minutes. She voiced understandings of medical therapy and of her diagnosis.  Sterling Big, MD  addendeum 12/06/20

## 2020-11-10 NOTE — Assessment & Plan Note (Signed)
Doing well with gabapentin and we will renew this for the next year  Otherwise continue foot support and her exercise plan

## 2020-11-29 DIAGNOSIS — Z961 Presence of intraocular lens: Secondary | ICD-10-CM | POA: Diagnosis not present

## 2020-12-28 DIAGNOSIS — H16233 Neurotrophic keratoconjunctivitis, bilateral: Secondary | ICD-10-CM | POA: Diagnosis not present

## 2021-01-30 DIAGNOSIS — H43392 Other vitreous opacities, left eye: Secondary | ICD-10-CM | POA: Diagnosis not present

## 2021-02-01 DIAGNOSIS — H524 Presbyopia: Secondary | ICD-10-CM | POA: Diagnosis not present

## 2021-03-02 ENCOUNTER — Telehealth: Payer: Self-pay

## 2021-03-02 DIAGNOSIS — M810 Age-related osteoporosis without current pathological fracture: Secondary | ICD-10-CM

## 2021-03-02 NOTE — Telephone Encounter (Signed)
Benefit submitted-pending  Next injection due after 04/02/2021

## 2021-03-15 NOTE — Telephone Encounter (Signed)
Benefits received. OOP cost is $295. Patient aware. Lab 03/31/21 and NV 04/04/21

## 2021-03-15 NOTE — Addendum Note (Signed)
Addended by: Consuella Lose on: 03/15/2021 12:20 PM   Modules accepted: Orders

## 2021-03-31 ENCOUNTER — Other Ambulatory Visit: Payer: Self-pay | Admitting: Family Medicine

## 2021-03-31 ENCOUNTER — Other Ambulatory Visit (INDEPENDENT_AMBULATORY_CARE_PROVIDER_SITE_OTHER): Payer: Medicare HMO

## 2021-03-31 ENCOUNTER — Other Ambulatory Visit: Payer: Self-pay

## 2021-03-31 DIAGNOSIS — M810 Age-related osteoporosis without current pathological fracture: Secondary | ICD-10-CM

## 2021-03-31 DIAGNOSIS — Z1231 Encounter for screening mammogram for malignant neoplasm of breast: Secondary | ICD-10-CM

## 2021-03-31 LAB — BASIC METABOLIC PANEL
BUN: 13 mg/dL (ref 6–23)
CO2: 29 mEq/L (ref 19–32)
Calcium: 10.2 mg/dL (ref 8.4–10.5)
Chloride: 104 mEq/L (ref 96–112)
Creatinine, Ser: 0.77 mg/dL (ref 0.40–1.20)
GFR: 76.14 mL/min (ref >60.00)
Glucose, Bld: 101 mg/dL — ABNORMAL HIGH (ref 70–99)
Potassium: 4 mEq/L (ref 3.5–5.1)
Sodium: 139 mEq/L (ref 135–145)

## 2021-03-31 NOTE — Telephone Encounter (Signed)
Labs checked. Calcium was normal 10.2 CrCl is 58.79 mL/min

## 2021-04-04 ENCOUNTER — Ambulatory Visit (INDEPENDENT_AMBULATORY_CARE_PROVIDER_SITE_OTHER): Payer: Medicare HMO

## 2021-04-04 ENCOUNTER — Other Ambulatory Visit: Payer: Self-pay

## 2021-04-04 DIAGNOSIS — M810 Age-related osteoporosis without current pathological fracture: Secondary | ICD-10-CM

## 2021-04-04 MED ORDER — DENOSUMAB 60 MG/ML ~~LOC~~ SOSY
60.0000 mg | PREFILLED_SYRINGE | Freq: Once | SUBCUTANEOUS | Status: AC
Start: 2021-04-04 — End: 2021-04-04
  Administered 2021-04-04: 60 mg via SUBCUTANEOUS

## 2021-04-04 NOTE — Progress Notes (Signed)
Per orders of Dr. Ermalene Searing, injection of Prolia given by Erby Pian. Patient tolerated injection well.

## 2021-05-10 ENCOUNTER — Ambulatory Visit
Admission: RE | Admit: 2021-05-10 | Discharge: 2021-05-10 | Disposition: A | Discharge status: Home / Self Care | Payer: Medicare HMO | Source: Ambulatory Visit | Attending: Family Medicine | Admitting: Family Medicine

## 2021-05-10 DIAGNOSIS — Z1231 Encounter for screening mammogram for malignant neoplasm of breast: Secondary | ICD-10-CM | POA: Diagnosis not present

## 2021-05-24 DIAGNOSIS — Z885 Allergy status to narcotic agent status: Secondary | ICD-10-CM | POA: Diagnosis not present

## 2021-05-24 DIAGNOSIS — H33021 Retinal detachment with multiple breaks, right eye: Secondary | ICD-10-CM | POA: Diagnosis not present

## 2021-05-24 DIAGNOSIS — E785 Hyperlipidemia, unspecified: Secondary | ICD-10-CM | POA: Diagnosis not present

## 2021-05-24 DIAGNOSIS — H3321 Serous retinal detachment, right eye: Secondary | ICD-10-CM | POA: Diagnosis not present

## 2021-05-24 DIAGNOSIS — Z880 Allergy status to penicillin: Secondary | ICD-10-CM | POA: Diagnosis not present

## 2021-05-24 DIAGNOSIS — H53002 Unspecified amblyopia, left eye: Secondary | ICD-10-CM | POA: Diagnosis not present

## 2021-05-24 DIAGNOSIS — Z961 Presence of intraocular lens: Secondary | ICD-10-CM | POA: Diagnosis not present

## 2021-05-24 DIAGNOSIS — Z79899 Other long term (current) drug therapy: Secondary | ICD-10-CM | POA: Diagnosis not present

## 2021-05-24 DIAGNOSIS — R7303 Prediabetes: Secondary | ICD-10-CM | POA: Diagnosis not present

## 2021-05-24 DIAGNOSIS — H33011 Retinal detachment with single break, right eye: Secondary | ICD-10-CM | POA: Diagnosis not present

## 2021-07-05 ENCOUNTER — Telehealth: Payer: Self-pay | Admitting: Family Medicine

## 2021-07-05 DIAGNOSIS — E782 Mixed hyperlipidemia: Secondary | ICD-10-CM

## 2021-07-05 DIAGNOSIS — R7303 Prediabetes: Secondary | ICD-10-CM

## 2021-07-05 NOTE — Telephone Encounter (Signed)
-----   Message from Terri J Walsh sent at 07/03/2021  3:39 PM EST ----- ?Regarding: Lab orders for Thursday, 3.2.23 ?Patient is scheduled for CPX labs, please order future labs, Thanks , Terri ? ? ?

## 2021-07-06 DIAGNOSIS — H35371 Puckering of macula, right eye: Secondary | ICD-10-CM | POA: Diagnosis not present

## 2021-07-13 ENCOUNTER — Other Ambulatory Visit (INDEPENDENT_AMBULATORY_CARE_PROVIDER_SITE_OTHER): Payer: Medicare HMO

## 2021-07-13 ENCOUNTER — Other Ambulatory Visit: Payer: Self-pay

## 2021-07-13 DIAGNOSIS — E782 Mixed hyperlipidemia: Secondary | ICD-10-CM | POA: Diagnosis not present

## 2021-07-13 DIAGNOSIS — R7303 Prediabetes: Secondary | ICD-10-CM

## 2021-07-13 LAB — LIPID PANEL
Cholesterol: 143 mg/dL (ref 0–200)
HDL: 64.4 mg/dL (ref >39.00)
LDL Cholesterol: 65 mg/dL (ref 0–99)
NonHDL: 78.52
Total CHOL/HDL Ratio: 2
Triglycerides: 70 mg/dL (ref 0.0–149.0)
VLDL: 14 mg/dL (ref 0.0–40.0)

## 2021-07-13 LAB — COMPREHENSIVE METABOLIC PANEL
ALT: 16 U/L (ref 0–35)
AST: 18 U/L (ref 0–37)
Albumin: 3.9 g/dL (ref 3.5–5.2)
Alkaline Phosphatase: 31 U/L — ABNORMAL LOW (ref 39–117)
BUN: 14 mg/dL (ref 6–23)
CO2: 28 mEq/L (ref 19–32)
Calcium: 9.9 mg/dL (ref 8.4–10.5)
Chloride: 102 mEq/L (ref 96–112)
Creatinine, Ser: 0.83 mg/dL (ref 0.40–1.20)
GFR: 69.45 mL/min (ref >60.00)
Glucose, Bld: 91 mg/dL (ref 70–99)
Potassium: 4 mEq/L (ref 3.5–5.1)
Sodium: 136 mEq/L (ref 135–145)
Total Bilirubin: 0.8 mg/dL (ref 0.2–1.2)
Total Protein: 6.5 g/dL (ref 6.0–8.3)

## 2021-07-13 LAB — HEMOGLOBIN A1C: Hgb A1c MFr Bld: 5.6 % (ref 4.6–6.5)

## 2021-07-13 NOTE — Progress Notes (Signed)
No critical labs need to be addressed urgently. We will discuss labs in detail at upcoming office visit.   

## 2021-07-18 ENCOUNTER — Encounter: Payer: Self-pay | Admitting: Family Medicine

## 2021-07-18 ENCOUNTER — Ambulatory Visit (INDEPENDENT_AMBULATORY_CARE_PROVIDER_SITE_OTHER): Payer: Medicare HMO | Admitting: Family Medicine

## 2021-07-18 ENCOUNTER — Other Ambulatory Visit: Payer: Self-pay

## 2021-07-18 VITALS — BP 100/72 | HR 87 | Temp 98.0°F | Ht 65.25 in | Wt 126.6 lb

## 2021-07-18 DIAGNOSIS — Z0000 Encounter for general adult medical examination without abnormal findings: Secondary | ICD-10-CM | POA: Diagnosis not present

## 2021-07-18 DIAGNOSIS — E782 Mixed hyperlipidemia: Secondary | ICD-10-CM

## 2021-07-18 DIAGNOSIS — M810 Age-related osteoporosis without current pathological fracture: Secondary | ICD-10-CM | POA: Diagnosis not present

## 2021-07-18 DIAGNOSIS — R7303 Prediabetes: Secondary | ICD-10-CM | POA: Diagnosis not present

## 2021-07-18 DIAGNOSIS — Z8669 Personal history of other diseases of the nervous system and sense organs: Secondary | ICD-10-CM | POA: Insufficient documentation

## 2021-07-18 NOTE — Assessment & Plan Note (Signed)
Due for repeat DEXA on prolia. ?Recommend weight bearing exercise, calcium in diet and vit D supplement 400 IU 1-2 times daily. ? ? ?

## 2021-07-18 NOTE — Assessment & Plan Note (Signed)
Stable, chronic.  Continue current medication. ? ? ?Atorvastatin 20 mg daily ?

## 2021-07-18 NOTE — Assessment & Plan Note (Signed)
Encouraged exercise, weight loss, healthy eating habits. ? ?

## 2021-07-18 NOTE — Progress Notes (Signed)
Patient ID: Yesenia Jones, female    DOB: 12/23/1945, 76 y.o.   MRN: [id number]  This visit was conducted in person.  BP 100/72    Pulse 87    Temp 98 F (36.7 C) (Oral)    Ht 5' 5.25" (1.657 m)    Wt 126 lb 9 oz (57.4 kg)    SpO2 96%    BMI 20.90 kg/m    CC:  Chief Complaint  Patient presents with   Medicare Wellness    Subjective:   HPI: Yesenia Jones is a 76 y.o. female presenting on 07/18/2021 for Medicare Wellness  The patient presents for annual medicare wellness, complete physical and review of chronic health problems. He/She also has the following acute concerns today:  Had retinal detachment , saw retinal specialist  Dr. Vincente Poli, in 05/2021.. had emergent surgery  I have personally reviewed the Medicare Annual Wellness questionnaire and have noted 1. The patient's medical and social history 2. Their use of alcohol, tobacco or illicit drugs 3. Their current medications and supplements 4. The patient's functional ability including ADL's, fall risks, home safety risks and hearing or visual             impairment. 5. Diet and physical activities 6. Evidence for depression or mood disorders Cognitive evaluation was performed and recorded on pt medicare questionnaire form. The patients weight, height, BMI and visual acuity have been recorded in the chart   I have made referrals, counseling and provided education to the patient based review of the above and I have provided the pt with a written personalized care plan for preventive services.   Documentation of this information was scanned into the electronic record under the media tab.   Advance directives and end of life planning reviewed in detail with patient and documented in EMR. Patient given handout on advance care directives if needed. HCPOA and living will updated if needed.  Hearing Screening - Comments:: Wears Bilateral Hearing Aides Vision Screening - Comments:: Eye Exam 07/06/21 at [facility] Dr. Llana Aliment.   Recently had a detached retina right eye  Falls:   Flowsheet Row Office Visit from 07/18/2021 in [facility] at [facility] Total Score 0      Elevated Cholesterol:  At [facility] on atorvastatin. Lab Results  Component Value Date   CHOL 143 07/13/2021   HDL 64.40 07/13/2021   LDLCALC 65 07/13/2021   LDLDIRECT 149.3 04/06/2013   TRIG 70.0 07/13/2021   CHOLHDL 2 07/13/2021  Using medications without problems: Muscle aches:  Diet compliance: healthy diet Exercise: has backed off on treadmill... now has started back elliptical and treadmill. Other complaints:  Prediabetes  Lab Results  Component Value Date   HGBA1C 5.6 07/13/2021         Relevant past medical, surgical, family and social history reviewed and updated as indicated. Interim medical history since our last visit reviewed. Allergies and medications reviewed and updated. Outpatient Medications Prior to Visit  Medication Sig Dispense Refill   acetaminophen (TYLENOL) 500 MG tablet Take 1,000 mg by mouth 2 (two) times daily as needed for moderate pain.     aspirin EC 81 MG tablet Take 81 mg by mouth daily. Swallow whole.     atorvastatin (LIPITOR) 20 MG tablet TAKE 1 TABLET BY MOUTH EVERYDAY AT BEDTIME 90 tablet 3   Calcium Carbonate-Vitamin D (CALCIUM 600+D PO) Take 1 tablet by mouth 2 (two) times daily.     Cholecalciferol (VITAMIN D-3)  1000 UNITS CAPS Take 1,000 Units by mouth daily.      denosumab (PROLIA) 60 MG/ML SOSY injection Inject 60 mg into the skin every 6 (six) months.     dextromethorphan-guaiFENesin (MUCINEX DM) 30-600 MG 12hr tablet Take 1 tablet by mouth 2 (two) times daily as needed for cough.     diphenhydramine-acetaminophen (TYLENOL PM) 25-500 MG TABS tablet Take 1 tablet by mouth at bedtime as needed (sleep).     fluticasone (FLONASE) 50 MCG/ACT nasal spray Place 2 sprays into both nostrils daily. 16 g 6   gabapentin (NEURONTIN) 600 MG tablet Take 1 tablet (600 mg total) by mouth 3  (three) times daily. 270 tablet 3   ibuprofen (ADVIL,MOTRIN) 200 MG tablet Take 600 mg by mouth 2 (two) times daily as needed for headache or moderate pain.     Multiple Vitamins-Minerals (CENTRUM SILVER ADULT 50+ PO) Take 1 tablet by mouth daily.     Propylene Glycol (SYSTANE BALANCE OP) Apply to eye in the morning, at noon, in the evening, and at bedtime.     Psyllium (METAMUCIL FIBER PO) Take 1 Dose by mouth every evening.     diclofenac Sodium (VOLTAREN) 1 % GEL Apply topically 4 (four) times daily. (Patient not taking: No sig reported)     metoCLOPramide (REGLAN) 10 MG tablet Take 1 tablet (10 mg total) by mouth 3 (three) times daily as needed for nausea (headache / nausea). (Patient not taking: No sig reported) 20 tablet 0   nitrofurantoin, macrocrystal-monohydrate, (MACROBID) 100 MG capsule Take 1 capsule (100 mg total) by mouth daily. 30 capsule 1   Facility-Administered Medications Prior to Visit  Medication Dose Route Frequency Provider Last Rate Last Admin   0.9 %  sodium chloride infusion  500 mL Intravenous Once Tressia Danas, MD       denosumab (PROLIA) injection 60 mg  60 mg Subcutaneous Q6 months Tonnie Friedel E, MD         Per HPI unless specifically indicated in ROS section below Review of Systems  Constitutional:  Negative for fatigue and fever.  HENT:  Negative for congestion.   Eyes:  Negative for pain.  Respiratory:  Negative for cough and shortness of breath.   Cardiovascular:  Negative for chest pain, palpitations and leg swelling.  Gastrointestinal:  Negative for abdominal pain.  Genitourinary:  Negative for dysuria and vaginal bleeding.  Musculoskeletal:  Negative for back pain.  Neurological:  Negative for syncope, light-headedness and headaches.  Psychiatric/Behavioral:  Negative for dysphoric mood.   Objective:  BP 100/72    Pulse 87    Temp 98 F (36.7 C) (Oral)    Ht 5' 5.25" (1.657 m)    Wt 126 lb 9 oz (57.4 kg)    SpO2 96%    BMI 20.90 kg/m   Wt  Readings from Last 3 Encounters:  07/18/21 126 lb 9 oz (57.4 kg)  10/05/20 128 lb (58.1 kg)  09/21/20 128 lb (58.1 kg)      Physical Exam    Results for orders placed or performed in visit on 07/13/21  Comprehensive metabolic panel  Result Value Ref Range   Sodium 136 135 - 145 mEq/L   Potassium 4.0 3.5 - 5.1 mEq/L   Chloride 102 96 - 112 mEq/L   CO2 28 19 - 32 mEq/L   Glucose, Bld 91 70 - 99 mg/dL   BUN 14 6 - 23 mg/dL   Creatinine, Ser 0.98 0.40 - 1.20 mg/dL   Total Bilirubin  0.8 0.2 - 1.2 mg/dL   Alkaline Phosphatase 31 (L) 39 - 117 U/L   AST 18 0 - 37 U/L   ALT 16 0 - 35 U/L   Total Protein 6.5 6.0 - 8.3 g/dL   Albumin 3.9 3.5 - 5.2 g/dL   GFR 42.68 >34.19 mL/min   Calcium 9.9 8.4 - 10.5 mg/dL  Lipid panel  Result Value Ref Range   Cholesterol 143 0 - 200 mg/dL   Triglycerides 62.2 0.0 - 149.0 mg/dL   HDL 29.79 >89.21 mg/dL   VLDL 19.4 0.0 - 17.4 mg/dL   LDL Cholesterol 65 0 - 99 mg/dL   Total CHOL/HDL Ratio 2    NonHDL 78.52   Hemoglobin A1c  Result Value Ref Range   Hgb A1c MFr Bld 5.6 4.6 - 6.5 %    This visit occurred during the SARS-CoV-2 public health emergency.  Safety protocols were in place, including screening questions prior to the visit, additional usage of staff PPE, and extensive cleaning of exam room while observing appropriate contact time as indicated for disinfecting solutions.   COVID 19 screen:  No recent travel or known exposure to COVID19 The patient denies respiratory symptoms of COVID 19 at this time. The importance of social distancing was discussed today.   Assessment and Plan The patient's preventative maintenance and recommended screening tests for an annual wellness exam were reviewed in full today. Brought up to date unless services declined.  Counselled on the importance of diet, exercise, and its role in overall health and mortality. The patient's FH and SH was reviewed, including their home life, tobacco status, and drug and  alcohol status.     Last Prolia 09/2020.   Vaccines:  Up to date , had both COVID vaccines  and booster, high dose flu, consider shingrix. Colon:  09/2020 no polyps.. Repeat in 10 years.   Mammo:Last 04/2021 nml. DXA: osteopenia 02/2009 on fosamax (on for 5-6 years, never diagnosed with osteoporosis)... Stopped in May 2013 when she ran out.Marland Kitchen  DEXA 04/22/2013: osteoporosis, started back on fosamax. Recheck in 04/2015: worsened.. Started on  prolia 05/2015   04/2017 on yr 2 prolia..Improvement in spine, none yet in hip, 04/2019 yr 4 prolia improvement I spine and now osteopenia -2.4 in hip. Repeat DUE now PAP/DVE: Previously done at [facility], but they retired. Last PAP done nml 2011, all previous were normal 3 in a row,has history of abnormal pap and crypothrapy 20 years ago, No further paps needed, plan every few year DVE. Asymptomatic.  Hep C:neg   Problem List Items Addressed This Visit     Hyperlipidemia (Chronic)    Stable, chronic.  Continue current medication.   Atorvastatin  20 mg daily      Relevant Medications   aspirin EC 81 MG tablet   Osteoporosis (Chronic)    Due for repeat DEXA on prolia. Recommend weight bearing exercise, calcium in diet and vit D supplement 400 IU 1-2 times daily.        Prediabetes (Chronic)    Encouraged exercise, weight loss, healthy eating habits.       History of retinal detachment   Other Visit Diagnoses     Medicare annual wellness visit, subsequent    -  Primary        Kerby Nora, MD

## 2021-07-18 NOTE — Patient Instructions (Signed)
Please call the location of your choice from the menu below to schedule your Mammogram and/or Bone Density appointment.   Call/MyChart our office to have the order placed. ? ?[place]  ? ?[facility]                ?      Phone:  [phone] ?[address]                               ?[place], [place] [address]                                                             ?Services: Traditional and 3D Mammogram, Bone Density  ? ?[facility]           ?      Phone: [phone] ?[address]                                                       ?[place], [place] [address]    ?Service: Bone Density ONLY  ? *this site does NOT perform mammograms ? ?[place]                       ? Phone:  [phone] ?[address]                                  ?[place], [place] [address]                                            ?Services:  3D Mammogram and Bone Density  ? ? ?[place] ? ?[place] [facility]   ?Phone:  [phone]   ?[place], [place] [address]                                            ?Services: 3D Mammogram and Bone Density ? ?[place] at [facility])  ?Phone:  [phone]   ?[address]                        ?[place], [place] [address]                                              ?  Services:  3D Mammogram and Bone Density ? ?

## 2021-08-12 ENCOUNTER — Other Ambulatory Visit: Payer: Self-pay | Admitting: Family Medicine

## 2021-09-12 ENCOUNTER — Telehealth: Payer: Self-pay

## 2021-09-12 DIAGNOSIS — M810 Age-related osteoporosis without current pathological fracture: Secondary | ICD-10-CM

## 2021-09-12 NOTE — Telephone Encounter (Signed)
Benefits submitted ?Next injection due 10/03/21 ?

## 2021-09-21 ENCOUNTER — Encounter: Payer: Self-pay | Admitting: Family Medicine

## 2021-09-21 NOTE — Telephone Encounter (Signed)
Bone density ordered.

## 2021-09-21 NOTE — Addendum Note (Signed)
Addended by: Consuella LoseHOPKINS, Hamza Empson V on: 09/21/2021 10:12 AM ? ? Modules accepted: Orders ? ?

## 2021-09-21 NOTE — Telephone Encounter (Signed)
PA needed. OOP cost is $301. ?Patient advised. ?Lab on 09/27/21 and NV 10/04/21 ? ?Patient is overdue for Bone Scan. Please order. ?

## 2021-09-21 NOTE — Telephone Encounter (Signed)
Spoke with patient and scheduled.  See phone note.

## 2021-09-21 NOTE — Addendum Note (Signed)
Addended byKerby Nora: Cote Mayabb E on: 09/21/2021 02:11 PM ? ? Modules accepted: Orders ? ?

## 2021-09-25 NOTE — Telephone Encounter (Signed)
PA done and approved for dates 09/25/21-09/26/22 ?

## 2021-09-27 ENCOUNTER — Other Ambulatory Visit (INDEPENDENT_AMBULATORY_CARE_PROVIDER_SITE_OTHER): Payer: Medicare HMO

## 2021-09-27 DIAGNOSIS — M810 Age-related osteoporosis without current pathological fracture: Secondary | ICD-10-CM | POA: Diagnosis not present

## 2021-09-27 LAB — BASIC METABOLIC PANEL
BUN: 17 mg/dL (ref 6–23)
CO2: 25 mEq/L (ref 19–32)
Calcium: 9.6 mg/dL (ref 8.4–10.5)
Chloride: 104 mEq/L (ref 96–112)
Creatinine, Ser: 0.91 mg/dL (ref 0.40–1.20)
GFR: 62.1 mL/min (ref >60.00)
Glucose, Bld: 113 mg/dL — ABNORMAL HIGH (ref 70–99)
Potassium: 3.9 mEq/L (ref 3.5–5.1)
Sodium: 135 mEq/L (ref 135–145)

## 2021-10-02 NOTE — Telephone Encounter (Signed)
CrCl is 49.15 mL/min Calcium was normal at 9.6

## 2021-10-04 ENCOUNTER — Ambulatory Visit (INDEPENDENT_AMBULATORY_CARE_PROVIDER_SITE_OTHER): Payer: Medicare HMO

## 2021-10-04 DIAGNOSIS — M810 Age-related osteoporosis without current pathological fracture: Secondary | ICD-10-CM

## 2021-10-04 MED ORDER — DENOSUMAB 60 MG/ML ~~LOC~~ SOSY
60.0000 mg | PREFILLED_SYRINGE | Freq: Once | SUBCUTANEOUS | Status: AC
  Administered 2021-10-04: 60 mg via SUBCUTANEOUS

## 2021-10-04 NOTE — Progress Notes (Signed)
Patient presented for 6-month Prolia injection SQ to left arm given by Champion Corales, CMA. Patient tolerated injection well. 

## 2021-10-05 DIAGNOSIS — Z961 Presence of intraocular lens: Secondary | ICD-10-CM | POA: Diagnosis not present

## 2021-10-05 DIAGNOSIS — H35371 Puckering of macula, right eye: Secondary | ICD-10-CM | POA: Diagnosis not present

## 2021-10-05 DIAGNOSIS — H33011 Retinal detachment with single break, right eye: Secondary | ICD-10-CM | POA: Diagnosis not present

## 2021-10-05 DIAGNOSIS — H53002 Unspecified amblyopia, left eye: Secondary | ICD-10-CM | POA: Diagnosis not present

## 2021-12-01 ENCOUNTER — Other Ambulatory Visit: Payer: Self-pay | Admitting: Sports Medicine

## 2021-12-02 ENCOUNTER — Other Ambulatory Visit: Payer: Self-pay | Admitting: *Deleted

## 2021-12-02 MED ORDER — GABAPENTIN 600 MG PO TABS: 600.0000 mg | ORAL_TABLET | Freq: Three times a day (TID) | ORAL | 0 refills | Status: DC

## 2022-01-25 ENCOUNTER — Telehealth (INDEPENDENT_AMBULATORY_CARE_PROVIDER_SITE_OTHER): Payer: Self-pay | Admitting: Sports Medicine

## 2022-01-25 DIAGNOSIS — G5753 Tarsal tunnel syndrome, bilateral lower limbs: Secondary | ICD-10-CM

## 2022-01-25 MED ORDER — GABAPENTIN 600 MG PO TABS: 600.0000 mg | ORAL_TABLET | Freq: Three times a day (TID) | ORAL | 3 refills | Status: DC

## 2022-01-25 NOTE — Assessment & Plan Note (Signed)
Doing well and is controlled on a dose of 600 of gabapentin 3 times daily We will continue this indefinitely She will come see me with any issues

## 2022-01-25 NOTE — Progress Notes (Signed)
Chief complaint bilateral tarsal tunnel syndrome  Patient is for telephone consultation today This was primarily so I could renew her medications I have treated her for years with gabapentin because of bilateral tarsal tunnel syndrome On multiple occasions when we have taken this off burning and tingling returns to both feet If we keep her on the dose of 603 times a day her feet are tolerable and she can exercise She is doing a good job with exercise and usually goes to the gym 5 days a week On this regimen her bone density has improved so her osteoporosis is doing better She is recovering from a retinal detachment  Assessment is bilateral tarsal tunnel syndrome  Plan is to continue on her gabapentin 600 3 times daily indefinitely

## 2022-03-14 ENCOUNTER — Encounter: Payer: Self-pay | Admitting: Family Medicine

## 2022-03-14 ENCOUNTER — Other Ambulatory Visit: Payer: Self-pay | Admitting: Family Medicine

## 2022-03-14 DIAGNOSIS — M810 Age-related osteoporosis without current pathological fracture: Secondary | ICD-10-CM

## 2022-03-16 ENCOUNTER — Other Ambulatory Visit: Payer: Self-pay | Admitting: Family Medicine

## 2022-03-16 DIAGNOSIS — Z1231 Encounter for screening mammogram for malignant neoplasm of breast: Secondary | ICD-10-CM

## 2022-03-22 ENCOUNTER — Telehealth: Payer: Self-pay

## 2022-03-22 DIAGNOSIS — M810 Age-related osteoporosis without current pathological fracture: Secondary | ICD-10-CM

## 2022-03-22 NOTE — Telephone Encounter (Signed)
Next injection due 04/07/22 or after. OOP cost is $315 Lab on 11/16 NV 11/28 PA done 09/25/21-5/15/-24

## 2022-03-29 ENCOUNTER — Other Ambulatory Visit (INDEPENDENT_AMBULATORY_CARE_PROVIDER_SITE_OTHER): Payer: Medicare HMO

## 2022-03-29 DIAGNOSIS — M810 Age-related osteoporosis without current pathological fracture: Secondary | ICD-10-CM

## 2022-03-29 LAB — BASIC METABOLIC PANEL
BUN: 16 mg/dL (ref 6–23)
CO2: 28 mEq/L (ref 19–32)
Calcium: 9.6 mg/dL (ref 8.4–10.5)
Chloride: 105 mEq/L (ref 96–112)
Creatinine, Ser: 0.89 mg/dL (ref 0.40–1.20)
GFR: 63.55 mL/min (ref >60.00)
Glucose, Bld: 87 mg/dL (ref 70–99)
Potassium: 4.4 mEq/L (ref 3.5–5.1)
Sodium: 139 mEq/L (ref 135–145)

## 2022-04-03 NOTE — Telephone Encounter (Signed)
CrCl is 49.49 mL/min. Calcium 9.6 normal

## 2022-04-10 ENCOUNTER — Ambulatory Visit (INDEPENDENT_AMBULATORY_CARE_PROVIDER_SITE_OTHER): Payer: Medicare HMO

## 2022-04-10 DIAGNOSIS — M810 Age-related osteoporosis without current pathological fracture: Secondary | ICD-10-CM

## 2022-04-10 MED ORDER — DENOSUMAB 60 MG/ML ~~LOC~~ SOSY
60.0000 mg | PREFILLED_SYRINGE | Freq: Once | SUBCUTANEOUS | Status: AC
  Administered 2022-04-10: 60 mg via SUBCUTANEOUS

## 2022-04-10 NOTE — Progress Notes (Signed)
Per orders of Dr. Ermalene Searing, injection of Prolia given by Consuella Lose. Patient tolerated injection well.

## 2022-04-18 ENCOUNTER — Telehealth: Payer: Self-pay

## 2022-04-18 NOTE — Telephone Encounter (Signed)
Last Prolia inj: 04/10/22 Next Prolia inj DUE: 10/08/22 

## 2022-07-11 ENCOUNTER — Telehealth: Payer: Self-pay | Admitting: Family Medicine

## 2022-07-11 DIAGNOSIS — E782 Mixed hyperlipidemia: Secondary | ICD-10-CM

## 2022-07-11 DIAGNOSIS — R7303 Prediabetes: Secondary | ICD-10-CM

## 2022-07-11 NOTE — Telephone Encounter (Signed)
-----   Message from Velna Hatchet, RT sent at 06/28/2022 10:32 AM EST ----- Regarding: Thu 2/29 lab Patient is scheduled for cpx, please order future labs.  Thanks, Anda Kraft

## 2022-07-12 ENCOUNTER — Other Ambulatory Visit (INDEPENDENT_AMBULATORY_CARE_PROVIDER_SITE_OTHER): Payer: Medicare HMO

## 2022-07-12 DIAGNOSIS — E782 Mixed hyperlipidemia: Secondary | ICD-10-CM | POA: Diagnosis not present

## 2022-07-12 DIAGNOSIS — R7303 Prediabetes: Secondary | ICD-10-CM | POA: Diagnosis not present

## 2022-07-12 LAB — COMPREHENSIVE METABOLIC PANEL
ALT: 12 U/L (ref 0–35)
AST: 15 U/L (ref 0–37)
Albumin: 4 g/dL (ref 3.5–5.2)
Alkaline Phosphatase: 34 U/L — ABNORMAL LOW (ref 39–117)
BUN: 15 mg/dL (ref 6–23)
CO2: 27 mEq/L (ref 19–32)
Calcium: 10.3 mg/dL (ref 8.4–10.5)
Chloride: 105 mEq/L (ref 96–112)
Creatinine, Ser: 0.79 mg/dL (ref 0.40–1.20)
GFR: 73.17 mL/min (ref >60.00)
Glucose, Bld: 98 mg/dL (ref 70–99)
Potassium: 4.3 mEq/L (ref 3.5–5.1)
Sodium: 139 mEq/L (ref 135–145)
Total Bilirubin: 0.4 mg/dL (ref 0.2–1.2)
Total Protein: 6.7 g/dL (ref 6.0–8.3)

## 2022-07-12 LAB — LIPID PANEL
Cholesterol: 157 mg/dL (ref 0–200)
HDL: 67.9 mg/dL (ref >39.00)
LDL Cholesterol: 76 mg/dL (ref 0–99)
NonHDL: 89.43
Total CHOL/HDL Ratio: 2
Triglycerides: 69 mg/dL (ref 0.0–149.0)
VLDL: 13.8 mg/dL (ref 0.0–40.0)

## 2022-07-12 LAB — HEMOGLOBIN A1C: Hgb A1c MFr Bld: 5.7 % (ref 4.6–6.5)

## 2022-07-12 NOTE — Progress Notes (Signed)
No critical labs need to be addressed urgently. We will discuss labs in detail at upcoming office visit.   

## 2022-07-17 ENCOUNTER — Ambulatory Visit (INDEPENDENT_AMBULATORY_CARE_PROVIDER_SITE_OTHER): Payer: Medicare HMO

## 2022-07-17 VITALS — Ht 65.5 in | Wt 125.0 lb

## 2022-07-17 DIAGNOSIS — Z0000 Encounter for general adult medical examination without abnormal findings: Secondary | ICD-10-CM | POA: Diagnosis not present

## 2022-07-17 NOTE — Patient Instructions (Addendum)
[name] , Thank you for taking time to come for your Medicare Wellness Visit. I appreciate your ongoing commitment to your health goals. Please review the following plan we discussed and let me know if I can assist you in the future.   These are the goals we discussed:  Goals      Increase physical activity     Starting 07/03/2018, I will continue to exercise for 45-60 minutes 5-6 days per week.      Patient Stated     07/09/2019, I will continue to walk 1 mile everyday and exercise on my treadmill 5 days a week.      Patient Stated     Continue to work out.        This is a list of the screening recommended for you and due dates:  Health Maintenance  Topic Date Due   DEXA scan (bone density measurement)  05/03/2021   COVID-19 Vaccine (5 - 2023-24 season) 01/12/2022   Mammogram  05/10/2022   Flu Shot  08/12/2022*   Medicare Annual Wellness Visit  07/17/2023   DTaP/Tdap/Td vaccine (3 - Tdap) 06/22/2027   Colon Cancer Screening  10/06/2030   Pneumonia Vaccine  Completed   Hepatitis C Screening: USPSTF Recommendation to screen - Ages 18-79 yo.  Completed   Zoster (Shingles) Vaccine  Completed   HPV Vaccine  Aged Out  *Topic was postponed. The date shown is not the original due date.    Advanced directives: Advance directive discussed with you today. Even though you declined this today, please call our office should you change your mind, and we can give you the proper paperwork for you to fill out.   Conditions/risks identified: none  Next appointment: Follow up in one year for your annual wellness visit 07/22/2023 @ 10:45 via telephone.   Preventive Care 33 Years and Older, Female Preventive care refers to lifestyle choices and visits with your health care provider that can promote health and wellness. What does preventive care include? A yearly physical exam. This is also called an annual well check. Dental exams once or twice a year. Routine eye exams. Ask your health  care provider how often you should have your eyes checked. Personal lifestyle choices, including: Daily care of your teeth and gums. Regular physical activity. Eating a healthy diet. Avoiding tobacco and drug use. Limiting alcohol use. Practicing safe sex. Taking low-dose aspirin every day. Taking vitamin and mineral supplements as recommended by your health care provider. What happens during an annual well check? The services and screenings done by your health care provider during your annual well check will depend on your age, overall health, lifestyle risk factors, and family history of disease. Counseling  Your health care provider may ask you questions about your: Alcohol use. Tobacco use. Drug use. Emotional well-being. Home and relationship well-being. Sexual activity. Eating habits. History of falls. Memory and ability to understand (cognition). Work and work Statistician. Reproductive health. Screening  You may have the following tests or measurements: Height, weight, and BMI. Blood pressure. Lipid and cholesterol levels. These may be checked every 5 years, or more frequently if you are over 25 years old. Skin check. Lung cancer screening. You may have this screening every year starting at age 72 if you have a 30-pack-year history of smoking and currently smoke or have quit within the past 15 years. Fecal occult blood test (FOBT) of the stool. You may have this test every year starting at age 63. Flexible sigmoidoscopy  or colonoscopy. You may have a sigmoidoscopy every 5 years or a colonoscopy every 10 years starting at age 30. Hepatitis C blood test. Hepatitis B blood test. Sexually transmitted disease (STD) testing. Diabetes screening. This is done by checking your blood sugar (glucose) after you have not eaten for a while (fasting). You may have this done every 1-3 years. Bone density scan. This is done to screen for osteoporosis. You may have this done starting at  age 61. Mammogram. This may be done every 1-2 years. Talk to your health care provider about how often you should have regular mammograms. Talk with your health care provider about your test results, treatment options, and if necessary, the need for more tests. Vaccines  Your health care provider may recommend certain vaccines, such as: Influenza vaccine. This is recommended every year. Tetanus, diphtheria, and acellular pertussis (Tdap, Td) vaccine. You may need a Td booster every 10 years. Zoster vaccine. You may need this after age 71. Pneumococcal 13-valent conjugate (PCV13) vaccine. One dose is recommended after age 59. Pneumococcal polysaccharide (PPSV23) vaccine. One dose is recommended after age 60. Talk to your health care provider about which screenings and vaccines you need and how often you need them. This information is not intended to replace advice given to you by your health care provider. Make sure you discuss any questions you have with your health care provider. Document Released: 05/27/2015 Document Revised: 01/18/2016 Document Reviewed: 03/01/2015 Elsevier Interactive Patient Education  2017 [place] Prevention in the Home Falls can cause injuries. They can happen to people of all ages. There are many things you can do to make your home safe and to help prevent falls. What can I do on the outside of my home? Regularly fix the edges of walkways and driveways and fix any cracks. Remove anything that might make you trip as you walk through a door, such as a raised step or threshold. Trim any bushes or trees on the path to your home. Use bright outdoor lighting. Clear any walking paths of anything that might make someone trip, such as rocks or tools. Regularly check to see if handrails are loose or broken. Make sure that both sides of any steps have handrails. Any raised decks and porches should have guardrails on the edges. Have any leaves, snow, or ice cleared  regularly. Use sand or salt on walking paths during winter. Clean up any spills in your garage right away. This includes oil or grease spills. What can I do in the bathroom? Use night lights. Install grab bars by the toilet and in the tub and shower. Do not use towel bars as grab bars. Use non-skid mats or decals in the tub or shower. If you need to sit down in the shower, use a plastic, non-slip stool. Keep the floor dry. Clean up any water that spills on the floor as soon as it happens. Remove soap buildup in the tub or shower regularly. Attach bath mats securely with double-sided non-slip rug tape. Do not have throw rugs and other things on the floor that can make you trip. What can I do in the bedroom? Use night lights. Make sure that you have a light by your bed that is easy to reach. Do not use any sheets or blankets that are too big for your bed. They should not hang down onto the floor. Have a firm chair that has side arms. You can use this for support while you get dressed. Do  not have throw rugs and other things on the floor that can make you trip. What can I do in the kitchen? Clean up any spills right away. Avoid walking on wet floors. Keep items that you use a lot in easy-to-reach places. If you need to reach something above you, use a strong step stool that has a grab bar. Keep electrical cords out of the way. Do not use floor polish or wax that makes floors slippery. If you must use wax, use non-skid floor wax. Do not have throw rugs and other things on the floor that can make you trip. What can I do with my stairs? Do not leave any items on the stairs. Make sure that there are handrails on both sides of the stairs and use them. Fix handrails that are broken or loose. Make sure that handrails are as long as the stairways. Check any carpeting to make sure that it is firmly attached to the stairs. Fix any carpet that is loose or worn. Avoid having throw rugs at the top or  bottom of the stairs. If you do have throw rugs, attach them to the floor with carpet tape. Make sure that you have a light switch at the top of the stairs and the bottom of the stairs. If you do not have them, ask someone to add them for you. What else can I do to help prevent falls? Wear shoes that: Do not have high heels. Have rubber bottoms. Are comfortable and fit you well. Are closed at the toe. Do not wear sandals. If you use a stepladder: Make sure that it is fully opened. Do not climb a closed stepladder. Make sure that both sides of the stepladder are locked into place. Ask someone to hold it for you, if possible. Clearly mark and make sure that you can see: Any grab bars or handrails. First and last steps. Where the edge of each step is. Use tools that help you move around (mobility aids) if they are needed. These include: Canes. Walkers. Scooters. Crutches. Turn on the lights when you go into a dark area. Replace any light bulbs as soon as they burn out. Set up your furniture so you have a clear path. Avoid moving your furniture around. If any of your floors are uneven, fix them. If there are any pets around you, be aware of where they are. Review your medicines with your doctor. Some medicines can make you feel dizzy. This can increase your chance of falling. Ask your doctor what other things that you can do to help prevent falls. This information is not intended to replace advice given to you by your health care provider. Make sure you discuss any questions you have with your health care provider. Document Released: 02/24/2009 Document Revised: 10/06/2015 Document Reviewed: 06/04/2014 Elsevier Interactive Patient Education  2017 [employer].

## 2022-07-17 NOTE — Progress Notes (Signed)
I connected with  KAMMI SEGAL on 07/17/22 by a audio enabled telemedicine application and verified that I am speaking with the correct person using two identifiers.  Patient Location: Home  Provider Location: Office/Clinic  I discussed the limitations of evaluation and management by telemedicine. The patient expressed understanding and agreed to proceed.  Subjective:   Yesenia Jones is a 77 y.o. female who presents for Medicare Annual (Subsequent) preventive examination.  Review of Systems      Cardiac Risk Factors include: advanced age (>33mn, >>82women)     Objective:    Today's Vitals   07/17/22 1047  Weight: 125 lb (56.7 kg)  Height: 5' 5.5" (1.664 m)   Body mass index is 20.48 kg/m.     07/17/2022   10:59 AM 12/10/2019    9:24 AM 11/05/2019    9:16 AM 07/09/2019   10:35 AM 07/03/2018    8:38 AM 01/31/2018    1:56 PM 01/31/2018    6:56 AM  Advanced Directives  Does Patient Have a Medical Advance Directive? No No No No No No No  Would patient like information on creating a medical advance directive? No - Patient declined No - Patient declined No - Patient declined No - Patient declined No - Patient declined No - Patient declined No - Patient declined    Current Medications (verified) Outpatient Encounter Medications as of 07/17/2022  Medication Sig   acetaminophen (TYLENOL) 500 MG tablet Take 1,000 mg by mouth 2 (two) times daily as needed for moderate pain.   aspirin EC 81 MG tablet Take 81 mg by mouth daily. Swallow whole.   atorvastatin (LIPITOR) 20 MG tablet TAKE 1 TABLET BY MOUTH EVERYDAY AT BEDTIME   Calcium Carbonate-Vitamin D (CALCIUM 600+D PO) Take 1 tablet by mouth 2 (two) times daily.   Cholecalciferol (VITAMIN D-3) 1000 UNITS CAPS Take 1,000 Units by mouth daily.    denosumab (PROLIA) 60 MG/ML SOSY injection Inject 60 mg into the skin every 6 (six) months.   dextromethorphan-guaiFENesin (MUCINEX DM) 30-600 MG 12hr tablet Take 1 tablet by mouth 2 (two)  times daily as needed for cough.   diphenhydramine-acetaminophen (TYLENOL PM) 25-500 MG TABS tablet Take 1 tablet by mouth at bedtime as needed (sleep).   fluticasone (FLONASE) 50 MCG/ACT nasal spray Place 2 sprays into both nostrils daily.   gabapentin (NEURONTIN) 600 MG tablet Take 1 tablet (600 mg total) by mouth 3 (three) times daily.   ibuprofen (ADVIL,MOTRIN) 200 MG tablet Take 600 mg by mouth 2 (two) times daily as needed for headache or moderate pain.   Multiple Vitamins-Minerals (CENTRUM SILVER ADULT 50+ PO) Take 1 tablet by mouth daily.   Propylene Glycol (SYSTANE BALANCE OP) Apply to eye in the morning, at noon, in the evening, and at bedtime.   Psyllium (METAMUCIL FIBER PO) Take 1 Dose by mouth every evening.   Facility-Administered Encounter Medications as of 07/17/2022  Medication   0.9 %  sodium chloride infusion   denosumab (PROLIA) injection 60 mg    Allergies (verified) Hydrocodone-acetaminophen, Oxycodone-acetaminophen, and Penicillins   History: Past Medical History:  Diagnosis Date   Allergies    Allergy    Arthritis    Cataract    bilat removed    Complication of anesthesia    slow to wake   COVID-19 virus infection 06/2020   HOH (hard of hearing)    wearing bilateral hearing aids   Hyperlipidemia    Osteoporosis    Past Surgical History:  Procedure Laterality Date   CATARACT EXTRACTION W/PHACO Left 11/05/2019   Procedure: CATARACT EXTRACTION PHACO AND INTRAOCULAR LENS PLACEMENT (IOC) LEFT 7.72  00:49.9;  Surgeon: Marchia Meiers, MD;  Location: [facility];  Service: Ophthalmology;  Laterality: Left;   CATARACT EXTRACTION W/PHACO Right 12/10/2019   Procedure: CATARACT EXTRACTION PHACO AND INTRAOCULAR LENS PLACEMENT (IOC) RIGHT 7.62  00:42.2;  Surgeon: Marchia Meiers, MD;  Location: [facility];  Service: Ophthalmology;  Laterality: Right;   CHOLECYSTECTOMY     COLONOSCOPY     KNEE SURGERY Left    scope   TOTAL KNEE ARTHROPLASTY Right  01/31/2018   Procedure: RIGHT TOTAL KNEE ARTHROPLASTY;  Surgeon: Mcarthur Rossetti, MD;  Location: [facility];  Service: Orthopedics;  Laterality: Right;   TOTAL KNEE ARTHROPLASTY Left    TUBAL LIGATION     Family History  Problem Relation Age of Onset   Stroke Mother    Hyperlipidemia Mother    Hyperlipidemia Father    Cancer Maternal Aunt        breast cancer   Colon polyps Neg Hx    Colon cancer Neg Hx    Esophageal cancer Neg Hx    Rectal cancer Neg Hx    Stomach cancer Neg Hx    Social History   Socioeconomic History   Marital status: Married    Spouse name: Not on file   Number of children: 2   Years of education: Not on file   Highest education level: Not on file  Occupational History   Occupation: retired for [employer]: RETIRED  Tobacco Use   Smoking status: Never   Smokeless tobacco: Never  Vaping Use   Vaping Use: Never used  Substance and Sexual Activity   Alcohol use: Yes    Alcohol/week: 7.0 standard drinks of alcohol    Types: 7 Glasses of wine per week   Drug use: No   Sexual activity: Not on file  Other Topics Concern   Not on file  Social History Narrative   Regular exercise-yes   Diet: fruits and veggies,water   Social Determinants of Health   Financial Resource Strain: Low Risk  (07/17/2022)   Overall Financial Resource Strain (CARDIA)    Difficulty of Paying Living Expenses: Not hard at all  Food Insecurity: No Food Insecurity (07/17/2022)   Hunger Vital Sign    Worried About Running Out of Food in the Last Year: Never true    Ran Out of Food in the Last Year: Never true  Transportation Needs: No Transportation Needs (07/17/2022)   PRAPARE - Hydrologist (Medical): No    Lack of Transportation (Non-Medical): No  Physical Activity: Sufficiently Active (07/17/2022)   Exercise Vital Sign    Days of Exercise per Week: 5 days    Minutes of Exercise per Session: 50 min  Stress: No Stress Concern Present  (07/17/2022)   [place]    Feeling of Stress : Not at all  Social Connections: Moderately Integrated (07/17/2022)   Social Connection and Isolation Panel [NHANES]    Frequency of Communication with Friends and Family: More than three times a week    Frequency of Social Gatherings with Friends and Family: More than three times a week    Attends Religious Services: Never    Marine scientist or Organizations: Yes    Attends Music therapist: More than 4 times per year  Marital Status: Married    Tobacco Counseling Counseling given: Not Answered   Clinical Intake:  Pre-visit preparation completed: Yes  Pain : No/denies pain     Nutritional Risks: None Diabetes: No  How often do you need to have someone help you when you read instructions, pamphlets, or other written materials from your doctor or pharmacy?: 1 - Never  Diabetic? no  Interpreter Needed?: No  Information entered by :: C.Shirlie Enck LPN   Activities of Daily Living    07/17/2022   11:01 AM  In your present state of health, do you have any difficulty performing the following activities:  Hearing? 0  Vision? 0  Difficulty concentrating or making decisions? 0  Walking or climbing stairs? 0  Dressing or bathing? 0  Doing errands, shopping? 0  Preparing Food and eating ? N  Using the Toilet? N  In the past six months, have you accidently leaked urine? Y  Comment wears pad  Do you have problems with loss of bowel control? N  Managing your Medications? N  Managing your Finances? N  Housekeeping or managing your Housekeeping? N    Patient Care Team: Jinny Sanders, MD as PCP - General (Family Medicine)  Indicate any recent Medical Services you may have received from other than [facility] providers in the past year (date may be approximate).     Assessment:   This is a routine wellness examination for Maiven.  Hearing/Vision  screen Hearing Screening - Comments:: Wears aids Vision Screening - Comments:: Wears Glasses - [facility]  Dietary issues and exercise activities discussed: Current Exercise Habits: Structured exercise class, Type of exercise: strength training/weights;treadmill, Time (Minutes): 50, Frequency (Times/Week): 5, Weekly Exercise (Minutes/Week): 250, Intensity: Moderate, Exercise limited by: None identified   Goals Addressed             This Visit's Progress    Patient Stated       Continue to work out.       Depression Screen    07/17/2022   10:58 AM 07/18/2021    9:28 AM 07/12/2020    8:48 AM 07/09/2019   10:39 AM 07/03/2018    8:24 AM 06/21/2017   10:12 AM 06/19/2016   11:41 AM  PHQ 2/9 Scores  PHQ - 2 Score 0 0 0 0 0 0 0  PHQ- 9 Score    0 0 0     Fall Risk    07/17/2022   11:00 AM 07/18/2021    9:28 AM 07/12/2020    8:48 AM 07/09/2019   10:36 AM 07/03/2018    8:24 AM  Fall Risk   Falls in the past year? 0 0 0 1 0  Comment    tripped over throw rug   Number falls in past yr: 0   0   Injury with Fall? 0   1   Comment    hurt shoulder and knee   Risk for fall due to : No Fall Risks   Medication side effect   Follow up Falls prevention discussed;Falls evaluation completed   Falls evaluation completed;Falls prevention discussed     FALL RISK PREVENTION PERTAINING TO THE HOME:  Any stairs in or around the home? Yes  If so, are there any without handrails? No  Home free of loose throw rugs in walkways, pet beds, electrical cords, etc? Yes  Adequate lighting in your home to reduce risk of falls? Yes   ASSISTIVE DEVICES UTILIZED TO PREVENT FALLS:  Life alert? No  Use of a cane, walker or w/c? No  Grab bars in the bathroom? Yes  Shower chair or bench in shower? Yes  Elevated toilet seat or a handicapped toilet? Yes    Cognitive Function:    07/09/2019   10:42 AM 07/03/2018    8:23 AM 06/21/2017   10:12 AM  MMSE - Mini Mental State Exam  Orientation to time '5 5 5  '$ Orientation to  Place '5 5 5  '$ Registration '3 3 3  '$ Attention/ Calculation 5 0 0  Recall '3 3 3  '$ Language- name 2 objects  0 0  Language- repeat '1 1 1  '$ Language- follow 3 step command  3 3  Language- read & follow direction  0 0  Write a sentence  0 0  Copy design  0 0  Total score  20 20        07/17/2022   11:02 AM  6CIT Screen  What Year? 0 points  What month? 0 points  What time? 0 points  Count back from 20 0 points  Months in reverse 0 points  Repeat phrase 0 points  Total Score 0 points    Immunizations Immunization History  Administered Date(s) Administered   Fluad Quad(high Dose 65+) 02/14/2021   Influenza Split 03/23/2011   Influenza Whole 03/11/2008, 03/17/2010   Influenza, High Dose Seasonal PF 03/03/2014, 03/15/2018, 12/30/2018, 02/12/2020   Influenza,inj,Quad PF,6+ Mos 03/08/2015, 02/27/2016   Influenza-Unspecified 02/11/2013, 03/01/2017   PFIZER(Purple Top)SARS-COV-2 Vaccination 06/05/2019, 06/25/2019, 05/24/2020   Pfizer Covid-19 Vaccine Bivalent Booster 36yr & up 03/23/2021   Pneumococcal Conjugate-13 05/28/2014, 02/27/2016   Pneumococcal Polysaccharide-23 03/25/2008, 06/21/2017   Td 07/21/2007, 06/21/2017   Zoster Recombinat (Shingrix) 07/12/2020, 10/12/2020   Zoster, Live 04/12/2008    TDAP status: Up to date  Flu Vaccine status: Up to date [employer] Pneumococcal vaccine status: Up to date  Covid-19 vaccine status: Information provided on how to obtain vaccines.   Qualifies for Shingles Vaccine? No   Zostavax completed Yes   Shingrix Completed?: Yes  Screening Tests Health Maintenance  Topic Date Due   DEXA SCAN  05/03/2021   COVID-19 Vaccine (5 - 2023-24 season) 01/12/2022   MAMMOGRAM  05/10/2022   INFLUENZA VACCINE  08/12/2022 (Originally 12/12/2021)   Medicare Annual Wellness (AWV)  07/17/2023   DTaP/Tdap/Td (3 - Tdap) 06/22/2027   COLONOSCOPY (Pts 45-47yrInsurance coverage will need to be confirmed)  10/06/2030   Pneumonia Vaccine  6528Years old  Completed   Hepatitis C Screening  Completed   Zoster Vaccines- Shingrix  Completed   HPV VACCINES  Aged Out    Health Maintenance  Health Maintenance Due  Topic Date Due   DEXA SCAN  05/03/2021   COVID-19 Vaccine (5 - 2023-24 season) 01/12/2022   MAMMOGRAM  05/10/2022    Colorectal cancer screening: No longer required.   Mammogram status: Completed 05/10/2021. Repeat every year scheduled for 09/05/22  Bone Density status: Completed 05/04/2019. Results reflect: Bone density results: OSTEOPOROSIS. Repeat every 2 years. Scheduled for 09/05/22  Lung Cancer Screening: (Low Dose CT Chest recommended if Age 76-80ears, 30 pack-year currently smoking OR have quit w/in 15years.) does not qualify.   Lung Cancer Screening Referral: no  Additional Screening:  Hepatitis C Screening: does not qualify; Completed 06/13/15  Vision Screening: Recommended annual ophthalmology exams for early detection of glaucoma and other disorders of the eye. Is the patient up to date with their annual eye exam?  Yes  Who is the provider  or what is the name of the office in which the patient attends annual eye exams? [facility] If pt is not established with a provider, would they like to be referred to a provider to establish care? No .   Dental Screening: Recommended annual dental exams for proper oral hygiene  Community Resource Referral / Chronic Care Management: CRR required this visit?  No   CCM required this visit?  No      Plan:     I have personally reviewed and noted the following in the patient's chart:   Medical and social history Use of alcohol, tobacco or illicit drugs  Current medications and supplements including opioid prescriptions. Patient is not currently taking opioid prescriptions. Functional ability and status Nutritional status Physical activity Advanced directives List of other physicians Hospitalizations, surgeries, and ER visits in previous 12  months Vitals Screenings to include cognitive, depression, and falls Referrals and appointments  In addition, I have reviewed and discussed with patient certain preventive protocols, quality metrics, and best practice recommendations. A written personalized care plan for preventive services as well as general preventive health recommendations were provided to patient.     Lebron Conners, LPN   [phone]   Nurse Notes: none

## 2022-07-19 ENCOUNTER — Ambulatory Visit (INDEPENDENT_AMBULATORY_CARE_PROVIDER_SITE_OTHER): Payer: Medicare HMO | Admitting: Family Medicine

## 2022-07-19 ENCOUNTER — Encounter: Payer: Self-pay | Admitting: Family Medicine

## 2022-07-19 VITALS — BP 110/78 | HR 78 | Temp 97.8°F | Ht 65.5 in | Wt 127.0 lb

## 2022-07-19 DIAGNOSIS — R7303 Prediabetes: Secondary | ICD-10-CM

## 2022-07-19 DIAGNOSIS — Z0000 Encounter for general adult medical examination without abnormal findings: Secondary | ICD-10-CM

## 2022-07-19 DIAGNOSIS — M810 Age-related osteoporosis without current pathological fracture: Secondary | ICD-10-CM | POA: Diagnosis not present

## 2022-07-19 DIAGNOSIS — E782 Mixed hyperlipidemia: Secondary | ICD-10-CM

## 2022-07-19 NOTE — Assessment & Plan Note (Signed)
Chronic, improvement in past overtime on Prolia. Due for repeat DEXA on prolia. Recommend weight bearing exercise, calcium in diet and vit D supplement 400 IU 1-2 times daily.

## 2022-07-19 NOTE — Assessment & Plan Note (Signed)
Stable, chronic.  Continue current medication. ? ? ?Atorvastatin 20 mg daily ?

## 2022-07-19 NOTE — Assessment & Plan Note (Signed)
Encouraged exercise, weight loss, healthy eating habits. ? ?

## 2022-07-19 NOTE — Progress Notes (Signed)
Patient ID: Yesenia Jones, female    DOB: [birth year], 77 y.o.   MRN: [id number]  This visit was conducted in person.  BP 110/78 (BP Location: Left Arm, Patient Position: Sitting, Cuff Size: Normal)   Pulse 78   Temp 97.8 F (36.6 C)   Ht 5' 5.5" (1.664 m)   Wt 127 lb (57.6 kg)   SpO2 99%   BMI 20.81 kg/m    CC:  Chief Complaint  Patient presents with   Annual Exam    Part 2. Part 1 07-17-22.    Subjective:   HPI: Yesenia Jones is a 77 y.o. female presenting on 07/19/2022 for Annual Exam (Part 2. Part 1 07-17-22.)  The patient presents for  complete physical and review of chronic health problems. He/She also has the following: none   Elevated Cholesterol:  At goal on atorvastatin. Lab Results  Component Value Date   CHOL 157 07/12/2022   HDL 67.90 07/12/2022   LDLCALC 76 07/12/2022   LDLDIRECT 149.3 04/06/2013   TRIG 69.0 07/12/2022   CHOLHDL 2 07/12/2022  Using medications without problems: none Muscle aches:  none Diet compliance: healthy diet Exercise:  treadmill 3 times a week 30-45 min, also weights 2 days a week. Other complaints:  Prediabetes  Lab Results  Component Value Date   HGBA1C 5.7 07/12/2022         Relevant past medical, surgical, family and social history reviewed and updated as indicated. Interim medical history since our last visit reviewed. Allergies and medications reviewed and updated. Outpatient Medications Prior to Visit  Medication Sig Dispense Refill   acetaminophen (TYLENOL) 500 MG tablet Take 1,000 mg by mouth 2 (two) times daily as needed for moderate pain.     aspirin EC 81 MG tablet Take 81 mg by mouth daily. Swallow whole.     atorvastatin (LIPITOR) 20 MG tablet TAKE 1 TABLET BY MOUTH EVERYDAY AT BEDTIME 90 tablet 3   Calcium Carbonate-Vitamin D (CALCIUM 600+D PO) Take 1 tablet by mouth 2 (two) times daily.     Cholecalciferol (VITAMIN D-3) 1000 UNITS CAPS Take 1,000 Units by mouth daily.      denosumab (PROLIA) 60 MG/ML  SOSY injection Inject 60 mg into the skin every 6 (six) months.     dextromethorphan-guaiFENesin (MUCINEX DM) 30-600 MG 12hr tablet Take 1 tablet by mouth 2 (two) times daily as needed for cough.     diphenhydramine-acetaminophen (TYLENOL PM) 25-500 MG TABS tablet Take 1 tablet by mouth at bedtime as needed (sleep).     fluticasone (FLONASE) 50 MCG/ACT nasal spray Place 2 sprays into both nostrils daily. 16 g 6   gabapentin (NEURONTIN) 600 MG tablet Take 1 tablet (600 mg total) by mouth 3 (three) times daily. 270 tablet 3   ibuprofen (ADVIL,MOTRIN) 200 MG tablet Take 600 mg by mouth 2 (two) times daily as needed for headache or moderate pain.     Multiple Vitamins-Minerals (CENTRUM SILVER ADULT 50+ PO) Take 1 tablet by mouth daily.     Propylene Glycol (SYSTANE BALANCE OP) Apply to eye in the morning, at noon, in the evening, and at bedtime.     Psyllium (METAMUCIL FIBER PO) Take 1 Dose by mouth every evening.     Facility-Administered Medications Prior to Visit  Medication Dose Route Frequency Provider Last Rate Last Admin   denosumab (PROLIA) injection 60 mg  60 mg Subcutaneous Q6 months Serayah Yazdani E, MD       0.9 %  sodium chloride infusion  500 mL Intravenous Once [place], MD         Per HPI unless specifically indicated in ROS section below Review of Systems  Constitutional:  Negative for fatigue and fever.  HENT:  Negative for congestion.   Eyes:  Negative for pain.  Respiratory:  Negative for cough and shortness of breath.   Cardiovascular:  Negative for chest pain, palpitations and leg swelling.  Gastrointestinal:  Negative for abdominal pain.  Genitourinary:  Negative for dysuria and vaginal bleeding.  Musculoskeletal:  Negative for back pain.  Neurological:  Negative for syncope, light-headedness and headaches.  Psychiatric/Behavioral:  Negative for dysphoric mood.    Objective:  BP 110/78 (BP Location: Left Arm, Patient Position: Sitting, Cuff Size: Normal)    Pulse 78   Temp 97.8 F (36.6 C)   Ht 5' 5.5" (1.664 m)   Wt 127 lb (57.6 kg)   SpO2 99%   BMI 20.81 kg/m   Wt Readings from Last 3 Encounters:  07/19/22 127 lb (57.6 kg)  07/17/22 125 lb (56.7 kg)  07/18/21 126 lb 9 oz (57.4 kg)      Physical Exam Constitutional:      General: She is not in acute distress.    Appearance: Normal appearance. She is well-developed. She is not ill-appearing or toxic-appearing.  HENT:     Head: Normocephalic.     Right Ear: Hearing, tympanic membrane, ear canal and external ear normal. Tympanic membrane is not erythematous, retracted or bulging.     Left Ear: Hearing, tympanic membrane, ear canal and external ear normal. Tympanic membrane is not erythematous, retracted or bulging.     Nose: No mucosal edema or rhinorrhea.     Right Sinus: No maxillary sinus tenderness or frontal sinus tenderness.     Left Sinus: No maxillary sinus tenderness or frontal sinus tenderness.     Mouth/Throat:     Pharynx: Uvula midline.  Eyes:     General: Lids are normal. Lids are everted, no foreign bodies appreciated.     Conjunctiva/sclera: Conjunctivae normal.     Pupils: Pupils are equal, round, and reactive to light.  Neck:     Thyroid: No thyroid mass or thyromegaly.     Vascular: No carotid bruit.     Trachea: Trachea normal.  Cardiovascular:     Rate and Rhythm: Normal rate and regular rhythm.     Pulses: Normal pulses.     Heart sounds: Normal heart sounds, S1 normal and S2 normal. No murmur heard.    No friction rub. No gallop.  Pulmonary:     Effort: Pulmonary effort is normal. No tachypnea or respiratory distress.     Breath sounds: Normal breath sounds. No decreased breath sounds, wheezing, rhonchi or rales.  Abdominal:     General: Bowel sounds are normal.     Palpations: Abdomen is soft.     Tenderness: There is no abdominal tenderness.  Musculoskeletal:     Cervical back: Normal range of motion and neck supple.  Skin:    General: Skin is  warm and dry.     Findings: No rash.  Neurological:     Mental Status: She is alert.  Psychiatric:        Mood and Affect: Mood is not anxious or depressed.        Speech: Speech normal.        Behavior: Behavior normal. Behavior is cooperative.        Thought Content: Thought  content normal.        Judgment: Judgment normal.       Results for orders placed or performed in visit on 07/12/22  Comprehensive metabolic panel  Result Value Ref Range   Sodium 139 135 - 145 mEq/L   Potassium 4.3 3.5 - 5.1 mEq/L   Chloride 105 96 - 112 mEq/L   CO2 27 19 - 32 mEq/L   Glucose, Bld 98 70 - 99 mg/dL   BUN 15 6 - 23 mg/dL   Creatinine, Ser 0.79 0.40 - 1.20 mg/dL   Total Bilirubin 0.4 0.2 - 1.2 mg/dL   Alkaline Phosphatase 34 (L) 39 - 117 U/L   AST 15 0 - 37 U/L   ALT 12 0 - 35 U/L   Total Protein 6.7 6.0 - 8.3 g/dL   Albumin 4.0 3.5 - 5.2 g/dL   GFR 73.17 >60.00 mL/min   Calcium 10.3 8.4 - 10.5 mg/dL  Lipid panel  Result Value Ref Range   Cholesterol 157 0 - 200 mg/dL   Triglycerides 69.0 0.0 - 149.0 mg/dL   HDL 67.90 >39.00 mg/dL   VLDL 13.8 0.0 - 40.0 mg/dL   LDL Cholesterol 76 0 - 99 mg/dL   Total CHOL/HDL Ratio 2    NonHDL 89.43   Hemoglobin A1c  Result Value Ref Range   Hgb A1c MFr Bld 5.7 4.6 - 6.5 %    This visit occurred during the SARS-CoV-2 public health emergency.  Safety protocols were in place, including screening questions prior to the visit, additional usage of staff PPE, and extensive cleaning of exam room while observing appropriate contact time as indicated for disinfecting solutions.   COVID 19 screen:  No recent travel or known exposure to COVID19 The patient denies respiratory symptoms of COVID 19 at this time. The importance of social distancing was discussed today.   Assessment and Plan The patient's preventative maintenance and recommended screening tests for an annual wellness exam were reviewed in full today. Brought up to date unless services  declined.  Counselled on the importance of diet, exercise, and its role in overall health and mortality. The patient's FH and SH was reviewed, including their home life, tobacco status, and drug and alcohol status.     Prolia due in May   Vaccines:  Up to date COVID vaccine x 4,  PNA, high dose flu, shingrix. Colon:  09/2020 no polyps..  no further indicated. Mammo:Last 04/2021 nml... scheduled DXA: osteopenia 02/2009 on fosamax (on for 5-6 years, never diagnosed with osteoporosis)... Stopped in May 2013 when she ran out.Marland Kitchen  DEXA 04/22/2013: osteoporosis, started back on fosamax. Recheck in 04/2015: worsened.. Started on  prolia 05/2015   04/2017 on yr 2 prolia..Improvement in spine, none yet in hip,  04/2019 yr 4 prolia, improvement,  12./2022 showed osteopenia T-2.4 Repeat DUE now.. scheduled PAP/DVE: Previously done at [facility], but they retired. Last PAP done nml 2011, all previous were normal 3 in a row,has history of abnormal pap and crypothrapy 20 years ago, No further paps needed,   Hep C:neg   Problem List Items Addressed This Visit     Hyperlipidemia (Chronic)    Stable, chronic.  Continue current medication.   Atorvastatin  20 mg daily      Prediabetes (Chronic)    Encouraged exercise, weight loss, healthy eating habits.       Other Visit Diagnoses     Routine general medical examination at a health care facility    -  Primary  Blaike Vickers, MD   

## 2022-08-12 ENCOUNTER — Other Ambulatory Visit: Payer: Self-pay | Admitting: Family Medicine

## 2022-09-05 ENCOUNTER — Ambulatory Visit
Admission: RE | Admit: 2022-09-05 | Discharge: 2022-09-05 | Disposition: A | Discharge status: Home / Self Care | Payer: Medicare HMO | Source: Ambulatory Visit | Attending: Family Medicine | Admitting: Family Medicine

## 2022-09-05 DIAGNOSIS — Z780 Asymptomatic menopausal state: Secondary | ICD-10-CM | POA: Diagnosis not present

## 2022-09-05 DIAGNOSIS — Z1231 Encounter for screening mammogram for malignant neoplasm of breast: Secondary | ICD-10-CM

## 2022-09-05 DIAGNOSIS — M85851 Other specified disorders of bone density and structure, right thigh: Secondary | ICD-10-CM | POA: Diagnosis not present

## 2022-09-05 DIAGNOSIS — M810 Age-related osteoporosis without current pathological fracture: Secondary | ICD-10-CM

## 2022-09-07 NOTE — Telephone Encounter (Signed)
Prolia VOB initiated via [email or web address] 

## 2022-09-18 ENCOUNTER — Other Ambulatory Visit (HOSPITAL_COMMUNITY): Payer: Self-pay

## 2022-09-18 NOTE — Telephone Encounter (Signed)
Pt ready for scheduling for Prolia on or after : 10/08/22  Out-of-pocket cost due at time of visit: $302  Primary: [employer] - MEDICARE Prolia co-insurance: 20% Admin fee co-insurance: 100%  Secondary: N/A Prolia co-insurance:  Admin fee co-insurance:   Medical Benefit Details: Date Benefits were checked: 09/07/22 Deductible: no/ Coinsurance: 20%/ Admin Fee: 100%  Prior Auth: approved  PA# [id number] Expiration Date: 09/27/2023   Pharmacy benefit: Copay $100 If patient wants fill through the pharmacy benefit please send prescription to: [employer], and include estimated need by date in rx notes. Pharmacy will ship medication directly to the office.  Patient NOT eligible for Prolia Copay Card. Copay Card can make patient's cost as little as $25. Link to apply: [email or web address]  Prolia approval letter attached to chart ** This summary of benefits is an estimation of the patient's out-of-pocket cost. Exact cost may very based on individual plan coverage.

## 2022-09-18 NOTE — Telephone Encounter (Signed)
Please assist

## 2022-10-01 ENCOUNTER — Telehealth: Payer: Self-pay | Admitting: Family Medicine

## 2022-10-01 NOTE — Telephone Encounter (Signed)
Patient contacted the office regarding prolia, wanted to get schedule to have prolia injection. Would like a call back to schedule whenever possible, please advise [phone]

## 2022-10-01 NOTE — Telephone Encounter (Signed)
Do you need to run benefits for 2024?

## 2022-10-03 NOTE — Telephone Encounter (Signed)
Patient called in and stated that she would like a call today to get her prolia scheduled. She stated that she doesn't want to wait any longer. Thank you!

## 2022-10-04 ENCOUNTER — Encounter: Payer: Self-pay | Admitting: Family Medicine

## 2022-10-04 NOTE — Telephone Encounter (Signed)
My chart sent to patient letting her know in duplicate message she sent that we are not able to schedule until we have the auth. Will reach out as soon as we receive this.

## 2022-10-04 NOTE — Telephone Encounter (Signed)
Have we received auth on this yet?

## 2022-10-05 ENCOUNTER — Telehealth: Payer: Self-pay | Admitting: Family Medicine

## 2022-10-05 NOTE — Telephone Encounter (Signed)
Called patient left message to call office.

## 2022-10-05 NOTE — Telephone Encounter (Signed)
Pt returned call to office requesting a called back on her cell # [phone]

## 2022-10-09 NOTE — Telephone Encounter (Signed)
Please see telephone encounter on 04/18/22

## 2022-10-10 ENCOUNTER — Other Ambulatory Visit (INDEPENDENT_AMBULATORY_CARE_PROVIDER_SITE_OTHER): Payer: Medicare HMO

## 2022-10-10 ENCOUNTER — Telehealth: Payer: Self-pay | Admitting: *Deleted

## 2022-10-10 DIAGNOSIS — M810 Age-related osteoporosis without current pathological fracture: Secondary | ICD-10-CM

## 2022-10-10 LAB — BASIC METABOLIC PANEL
BUN: 16 mg/dL (ref 6–23)
CO2: 28 mEq/L (ref 19–32)
Calcium: 10.4 mg/dL (ref 8.4–10.5)
Chloride: 101 mEq/L (ref 96–112)
Creatinine, Ser: 0.92 mg/dL (ref 0.40–1.20)
GFR: 60.84 mL/min (ref >60.00)
Glucose, Bld: 94 mg/dL (ref 70–99)
Potassium: 4.1 mEq/L (ref 3.5–5.1)
Sodium: 136 mEq/L (ref 135–145)

## 2022-10-10 NOTE — Telephone Encounter (Signed)
Called patient all app set up. Patient declined to have sent to pharmacy. Wanted to have done from office supply. Patient aware it will be a 302 charge at time of visit.

## 2022-10-10 NOTE — Telephone Encounter (Signed)
error 

## 2022-10-10 NOTE — Telephone Encounter (Signed)
-----   Message from [name] sent at 10/10/2022  7:18 AM EDT ----- Regarding: lab orders for today Please order Prolia labs, thanks

## 2022-10-11 DIAGNOSIS — H35371 Puckering of macula, right eye: Secondary | ICD-10-CM | POA: Diagnosis not present

## 2022-10-11 DIAGNOSIS — Z961 Presence of intraocular lens: Secondary | ICD-10-CM | POA: Diagnosis not present

## 2022-10-11 DIAGNOSIS — H33011 Retinal detachment with single break, right eye: Secondary | ICD-10-CM | POA: Diagnosis not present

## 2022-10-11 DIAGNOSIS — H53002 Unspecified amblyopia, left eye: Secondary | ICD-10-CM | POA: Diagnosis not present

## 2022-10-16 ENCOUNTER — Ambulatory Visit: Payer: Medicare HMO

## 2022-10-16 DIAGNOSIS — M810 Age-related osteoporosis without current pathological fracture: Secondary | ICD-10-CM

## 2022-10-16 MED ORDER — DENOSUMAB 60 MG/ML ~~LOC~~ SOSY: 60.0000 mg | PREFILLED_SYRINGE | Freq: Once | SUBCUTANEOUS | Status: AC

## 2022-10-16 NOTE — Progress Notes (Signed)
Per orders of Dr. Kerby Nora, injection of Prolia  given by Donnamarie Poag in left arm.  Patient tolerated injection well. Patient did not have any questions at this time.

## 2022-11-05 DIAGNOSIS — H52223 Regular astigmatism, bilateral: Secondary | ICD-10-CM | POA: Diagnosis not present

## 2022-11-05 DIAGNOSIS — H53042 Amblyopia suspect, left eye: Secondary | ICD-10-CM | POA: Diagnosis not present

## 2022-11-05 DIAGNOSIS — Z9842 Cataract extraction status, left eye: Secondary | ICD-10-CM | POA: Diagnosis not present

## 2022-11-05 DIAGNOSIS — H5203 Hypermetropia, bilateral: Secondary | ICD-10-CM | POA: Diagnosis not present

## 2022-11-05 DIAGNOSIS — Z0100 Encounter for examination of eyes and vision without abnormal findings: Secondary | ICD-10-CM | POA: Diagnosis not present

## 2022-11-05 DIAGNOSIS — Z9841 Cataract extraction status, right eye: Secondary | ICD-10-CM | POA: Diagnosis not present

## 2023-01-13 IMAGING — MG MM DIGITAL SCREENING BILAT W/ TOMO AND CAD
8 series · 9 of 24 positions shown · non-contrast
Comparison: Previous exam(s).

CLINICAL DATA: Screening.

EXAM:
DIGITAL SCREENING BILATERAL MAMMOGRAM WITH TOMOSYNTHESIS AND CAD
TECHNIQUE: Bilateral screening digital craniocaudal and mediolateral oblique
mammograms were obtained. Bilateral screening digital breast
tomosynthesis was performed. The images were evaluated with
computer-aided detection.

[R CC synth-2D]
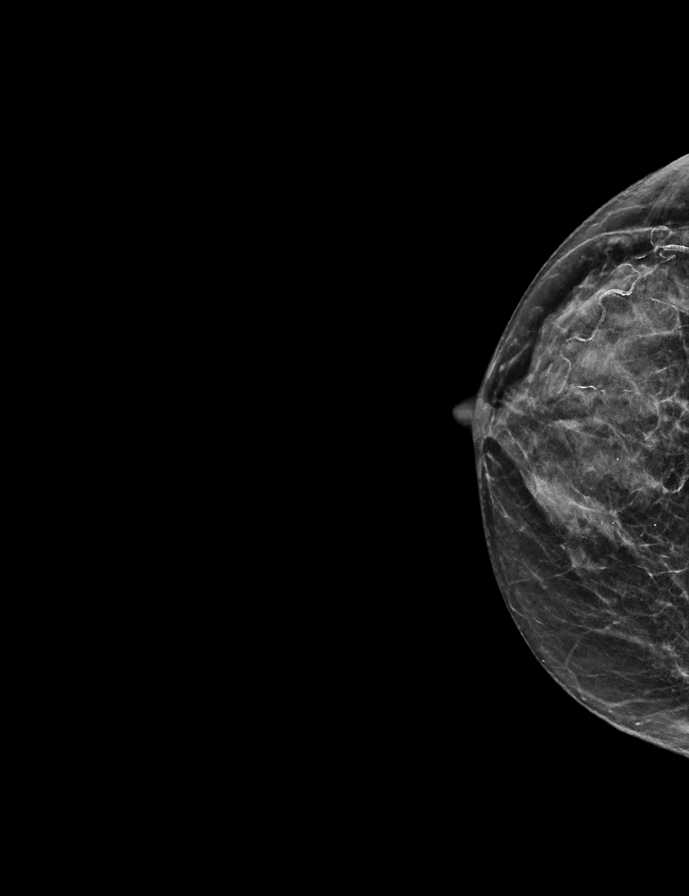

[R MLO synth-2D]
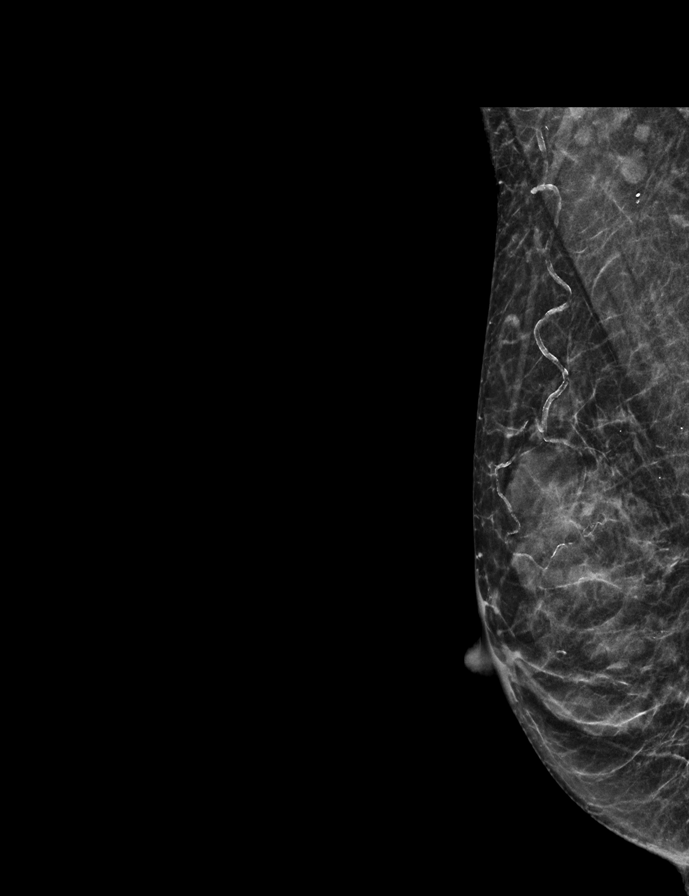

[L CC synth-2D]
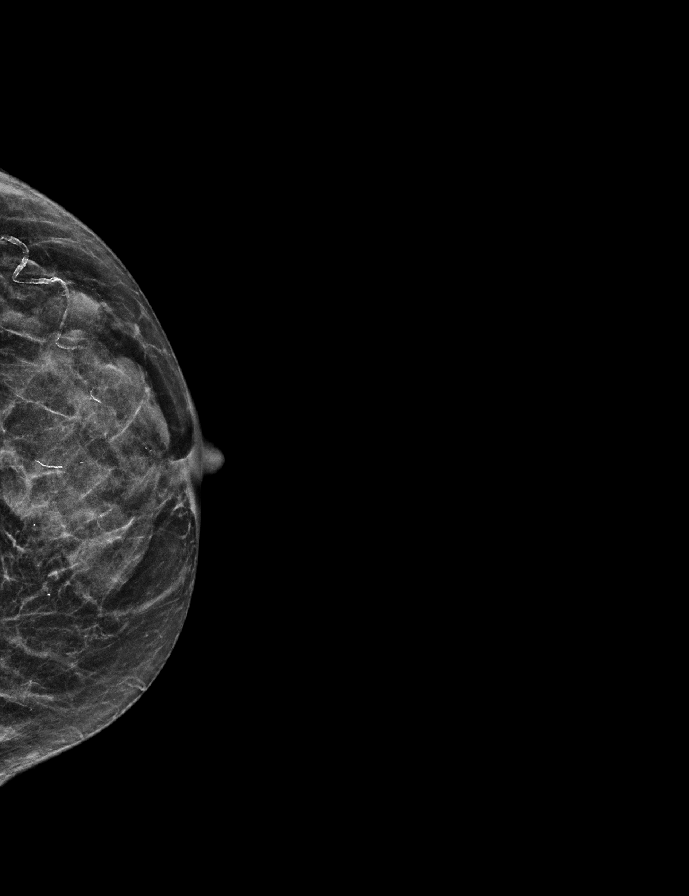

[L MLO synth-2D]
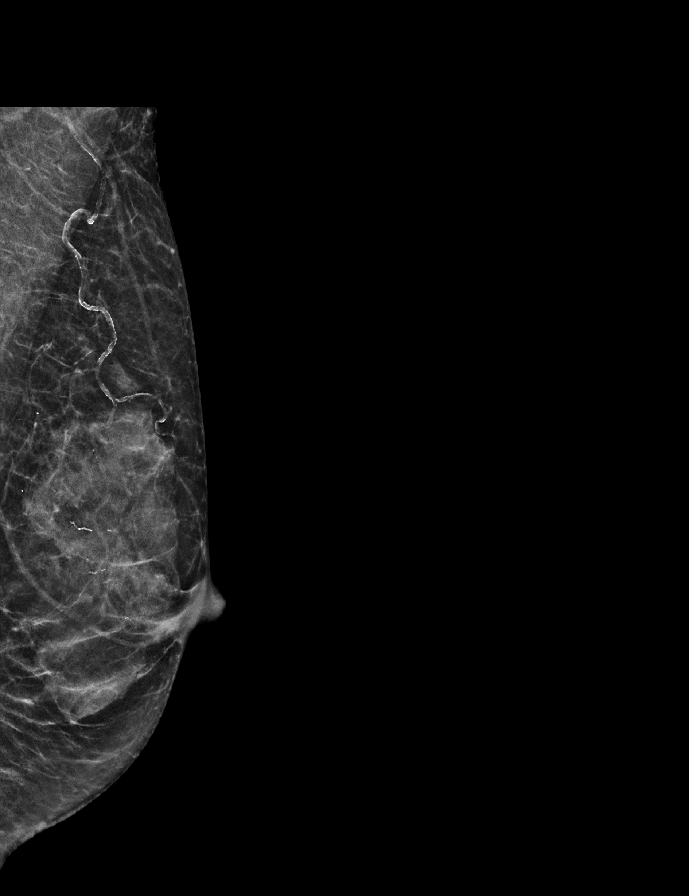

[L CC tomo · 2 of 43 frames shown]
[frame 14/43]
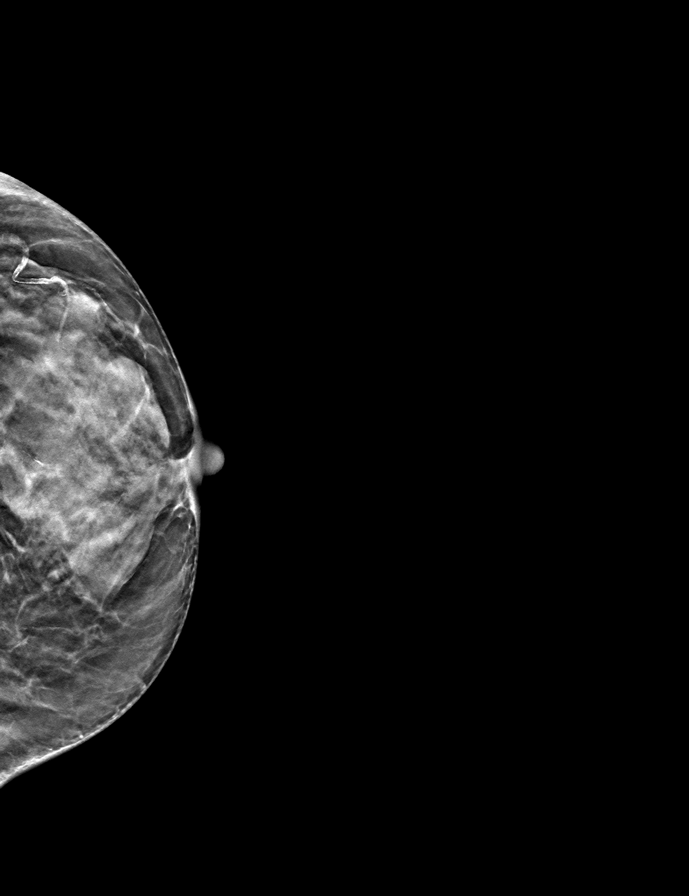
[frame 22/43]
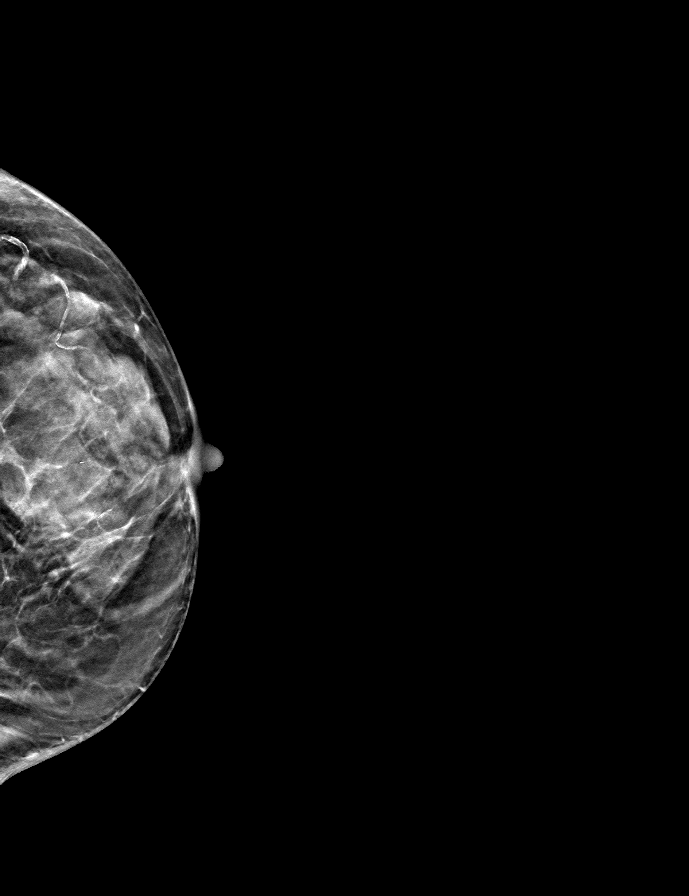

[R CC tomo · tomo slice 23/44.0]
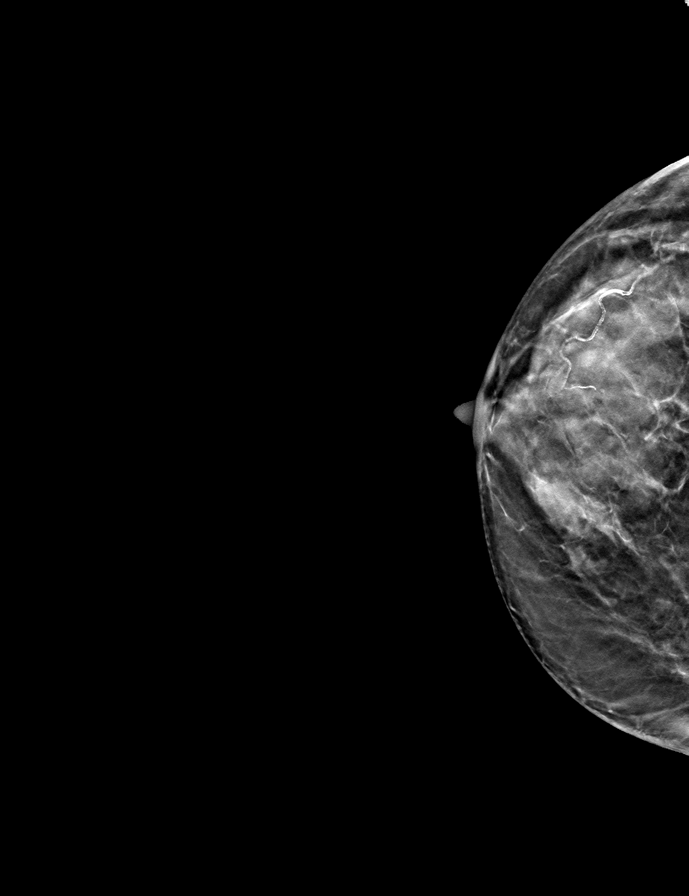

[R MLO tomo · tomo slice 23/46.0]
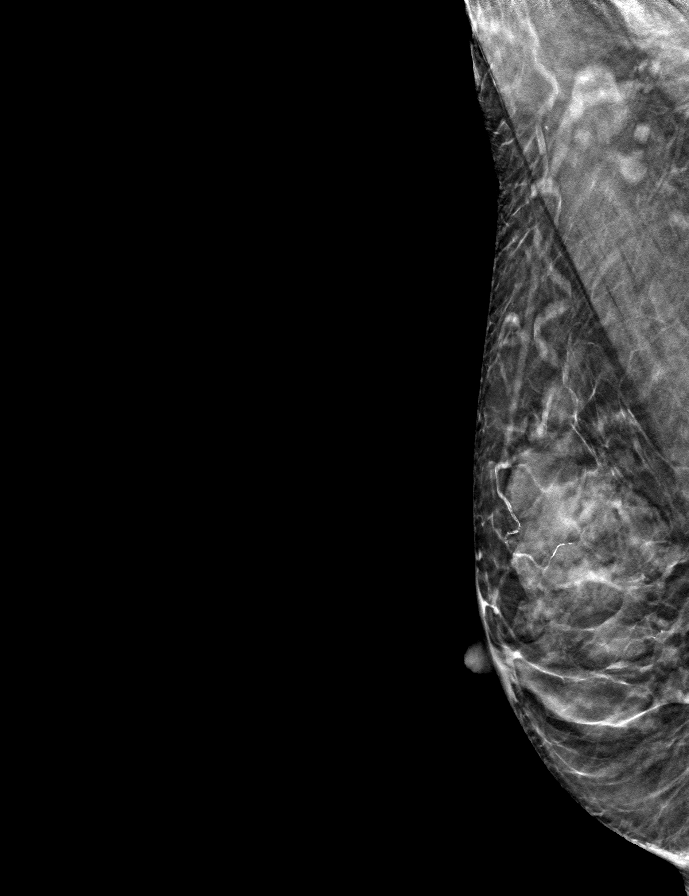

[L MLO tomo · tomo slice 21/42.0]
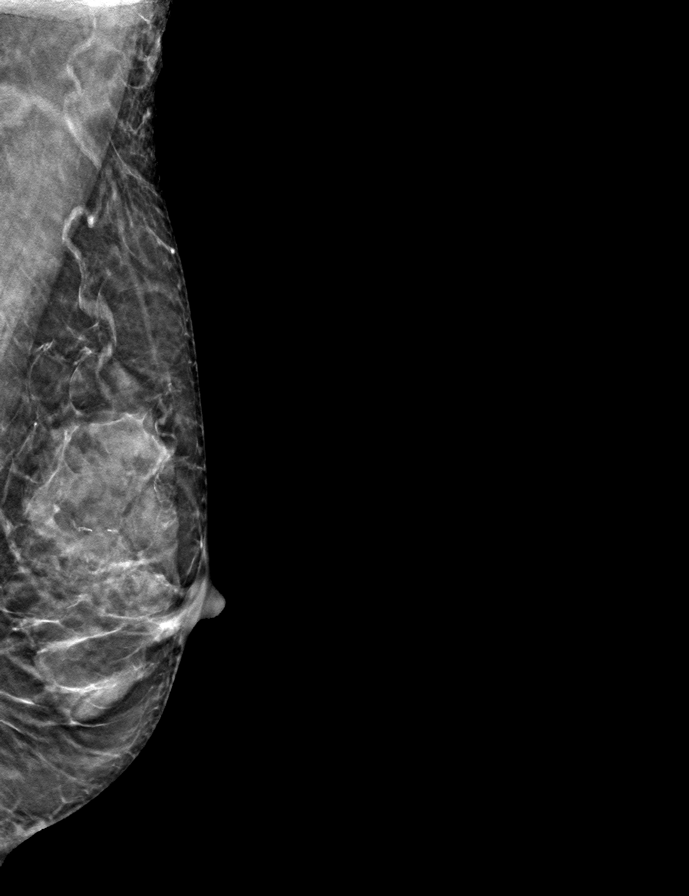

[9 of 24 positions shown; findings below may reference images not displayed]

ACR Breast Density Category c: The breast tissue is heterogeneously
dense, which may obscure small masses.
FINDINGS: There are no findings suspicious for malignancy.
IMPRESSION: No mammographic evidence of malignancy. A result letter of this
screening mammogram will be mailed directly to the patient.

RECOMMENDATION:
Screening mammogram in one year. (Code:Q3-W-BC3)

BI-RADS CATEGORY  1: Negative.

## 2023-01-22 ENCOUNTER — Ambulatory Visit: Payer: Medicare HMO | Admitting: Sports Medicine

## 2023-01-22 ENCOUNTER — Other Ambulatory Visit: Payer: Self-pay

## 2023-01-22 VITALS — BP 118/76 | Ht 65.5 in | Wt 127.0 lb

## 2023-01-22 DIAGNOSIS — G5753 Tarsal tunnel syndrome, bilateral lower limbs: Secondary | ICD-10-CM | POA: Diagnosis not present

## 2023-01-22 MED ORDER — GABAPENTIN 600 MG PO TABS: 600.0000 mg | ORAL_TABLET | Freq: Three times a day (TID) | ORAL | 3 refills | Status: DC

## 2023-01-22 NOTE — Assessment & Plan Note (Signed)
Patient needs a new pair of orthotics created as she has had some shift in the shape of her right foot in particular. The old orthotics are still functional and she can use those for gardening shoes. We will continue her gabapentin 600 3 times daily since she is very stable on this and does not note any side effects. She will return in the next 1 to 2 months for the new orthotics and then can return on a yearly basis as needed

## 2023-01-22 NOTE — Progress Notes (Signed)
Patient returns for follow-up of her tarsal tunnel syndrome  She has generally done very well since I started treating her in 2013.  Custom orthotics help relieve quite a bit of the tarsal tunnel symptoms.  However we did have to start gabapentin and gradually build this up to 600 3 times daily.  She has been very stable on that and really has no limitations to her activity.  She now goes to the gym 5 days a week and does treadmill on 3 of those days and strength work on 2 of those days.  She occasionally gets some redness over the left dorsum of the foot she may occasionally get a little burning and tenderness down the sole of the left foot.  The right foot rarely causes pain and has less redness noted.  Physical exam is a pleasant white female thin and in no acute distress BP 118/76   Ht 5' 5.5" (1.664 m)   Wt 127 lb (57.6 kg)   BMI 20.81 kg/m  Foot shape is narrow and thin and was originally a cavus shape On the right foot she has had a shift of her subtalar joint to where now she has some calcaneal valgus This leads to about 5 degrees turn out of the right foot There is collapse of the longitudinal arch by about a centimeter bilaterally when she stands She has Morton's callus bilaterally and loss of the plantar plate distal transverse arch There is some bunionette change bilaterally Good first toe motion

## 2023-01-30 ENCOUNTER — Ambulatory Visit (INDEPENDENT_AMBULATORY_CARE_PROVIDER_SITE_OTHER): Payer: Medicare HMO | Admitting: Sports Medicine

## 2023-01-30 VITALS — BP 120/84 | Ht 65.5 in | Wt 127.0 lb

## 2023-01-30 DIAGNOSIS — G5753 Tarsal tunnel syndrome, bilateral lower limbs: Secondary | ICD-10-CM

## 2023-01-30 NOTE — Progress Notes (Signed)
Complaint the patient comes for replacement of her orthotics that are now about 76 years old  Patient's original problem started with Morton's neuroma and chronic forefoot pain.  Since use of orthotics she has had significant relief and only has the forefoot pain returning when she is not using shoes with the orthotics.  Physical exam Pleasant older female no acute distress BP 120/84   Ht 5' 5.5" (1.664 m)   Wt 127 lb (57.6 kg)   BMI 20.81 kg/m   Feet show that she has resting pronation bilaterally Her foot shape is cavus but when she stands along arch collapse partially bilaterally On both feet she shows calcaneal valgus She also has flattening of the transverse arch at the plantar plate and develops Morton's callus on both sides There is some splaying between toes 1 and 2 and 2 and 3

## 2023-01-30 NOTE — Assessment & Plan Note (Signed)
This has responded well to custom orthotics and we have Her in these for over 9 years.  She gets recurrent symptoms if she is not wearing the orthotics  Patient was fitted for a standard, cushioned, semi-rigid orthotic.  The orthotic was heated and the patient stood on the orthotic blank positioned on the orthotic stand. The patient was positioned in subtalar neutral position and 10 degrees of ankle dorsiflexion in a weight bearing stance. After molding, a stable [employer] EVA base was applied to the orthotic blank.   The blank was ground to a stable position for weight bearing. size: 8 Red EVA base: Bilat heel wedges posting: MT medium pad left and MT cookie Rt additional orthotic padding: none

## 2023-04-22 ENCOUNTER — Telehealth: Payer: Self-pay | Admitting: Family Medicine

## 2023-04-22 NOTE — Telephone Encounter (Signed)
Patient called in and was wanting to schedule her Prolia injection. Thank you!

## 2023-04-24 NOTE — Telephone Encounter (Signed)
Please verify benefits  

## 2023-04-24 NOTE — Telephone Encounter (Signed)
Pt called checking status of message sent to [facility] on 12/9. Pt requested call back from [place] @ [phone]

## 2023-04-26 NOTE — Telephone Encounter (Signed)
My chart sent to patient to make aware we have not received auth at this time. Sending to team to follow up on as well.

## 2023-05-01 ENCOUNTER — Telehealth: Payer: Self-pay

## 2023-05-01 NOTE — Telephone Encounter (Signed)
Prolia VOB initiated via [email or web address] 

## 2023-05-01 NOTE — Telephone Encounter (Signed)
Prolia BIV in separate encounter.

## 2023-05-03 ENCOUNTER — Other Ambulatory Visit (HOSPITAL_COMMUNITY): Payer: Self-pay

## 2023-05-03 ENCOUNTER — Telehealth: Payer: Self-pay | Admitting: Family Medicine

## 2023-05-03 NOTE — Telephone Encounter (Signed)
Error

## 2023-05-03 NOTE — Telephone Encounter (Signed)
It was the insurance that called wanted to verify that Yesenia Jones has been received for patient Yesenia Jones and nothing further needed from our office. Nothing further needed in this encounter.

## 2023-05-03 NOTE — Telephone Encounter (Signed)
Pharmacy Patient Advocate Encounter  Received notification from [employer] that Prior Authorization for PROLIA has been APPROVED from 09/27/22 to 09/27/23   PA #/Case ID/Reference #: [id number]

## 2023-05-03 NOTE — Telephone Encounter (Signed)
Pt ready for scheduling for PROLIA on or after : 05/03/23  Out-of-pocket cost due at time of visit: $346  Number of injection/visits approved: 2  Primary: [employer] Prolia co-insurance: 20% Admin fee co-insurance: 20%   Secondary: ---  Prolia co-insurance:  Admin fee co-insurance:   Medical Benefit Details: Date Benefits were checked: 05/01/23 Deductible: NO/ Coinsurance: 20%/ Admin Fee: 20%  Prior Auth: APPROVED PA# [id number] Expiration Date:09/27/22-09/27/23   # of doses approved: 2  Pharmacy benefit: Copay $100 If patient wants fill through the pharmacy benefit please send prescription to: [employer], and include estimated need by date in rx notes. Pharmacy will ship medication directly to the office.  Patient NOT eligible for Prolia Copay Card. Copay Card can make patient's cost as little as $25. Link to apply: [email or web address]://[email or web address]  ** This summary of benefits is an estimation of the patient's out-of-pocket cost. Exact cost may very based on individual plan coverage.

## 2023-05-03 NOTE — Telephone Encounter (Signed)
Copied from CRM [id number]. Topic: General - Other >> May 03, 2023 12:43 PM Danika B wrote: Reason for CRM: Patient returned Morty Ortwein's call. Stated that she can call her back anytime as she will be at home. [phone]

## 2023-05-03 NOTE — Telephone Encounter (Signed)
Copied from CRM [id number]. Topic: General - Other >> May 03, 2023  9:39 AM Kathryne Eriksson wrote: Reason for CRM: [facility] on the line in regards to patient

## 2023-05-03 NOTE — Telephone Encounter (Signed)
Pt returning call; Joellen will contact pt first of week to schedule Prolia injection nurse visit. Pt voiced understanding and appreciative. Sending note to [facility] CMA

## 2023-05-03 NOTE — Telephone Encounter (Signed)
Left message to return call to our office.  

## 2023-05-06 ENCOUNTER — Other Ambulatory Visit (INDEPENDENT_AMBULATORY_CARE_PROVIDER_SITE_OTHER): Payer: Medicare HMO

## 2023-05-06 ENCOUNTER — Other Ambulatory Visit: Payer: Self-pay

## 2023-05-06 DIAGNOSIS — M810 Age-related osteoporosis without current pathological fracture: Secondary | ICD-10-CM | POA: Diagnosis not present

## 2023-05-06 NOTE — Telephone Encounter (Signed)
Called patient reviewed all information. She declined to use pharmacy benefits. She has agreed to the  $346 copay. She is aware will be due at time of visit. Lab and nurse visit scheduled.

## 2023-05-07 LAB — BASIC METABOLIC PANEL
BUN: 21 mg/dL (ref 6–23)
CO2: 29 meq/L (ref 19–32)
Calcium: 10.9 mg/dL — ABNORMAL HIGH (ref 8.4–10.5)
Chloride: 102 meq/L (ref 96–112)
Creatinine, Ser: 0.96 mg/dL (ref 0.40–1.20)
GFR: 57.58 mL/min — ABNORMAL LOW (ref >60.00)
Glucose, Bld: 89 mg/dL (ref 70–99)
Potassium: 3.9 meq/L (ref 3.5–5.1)
Sodium: 138 meq/L (ref 135–145)

## 2023-05-07 NOTE — Telephone Encounter (Signed)
Called patient set up for visit no further action needed.

## 2023-05-09 ENCOUNTER — Ambulatory Visit: Payer: Medicare HMO

## 2023-05-09 DIAGNOSIS — M810 Age-related osteoporosis without current pathological fracture: Secondary | ICD-10-CM

## 2023-05-09 MED ORDER — DENOSUMAB 60 MG/ML ~~LOC~~ SOSY
60.0000 mg | PREFILLED_SYRINGE | Freq: Once | SUBCUTANEOUS | Status: AC
Start: 2023-11-07 — End: ?

## 2023-05-09 MED ORDER — DENOSUMAB 60 MG/ML ~~LOC~~ SOSY
60.0000 mg | PREFILLED_SYRINGE | Freq: Once | SUBCUTANEOUS | Status: AC
Start: 2023-05-09 — End: 2023-05-09
  Administered 2023-05-09: 60 mg via SUBCUTANEOUS

## 2023-05-09 NOTE — Progress Notes (Signed)
Per orders of Dr. Kerby Nora who is out of office and Dr Karleen Hampshire Copland who is in office, injection of prolia 60 mg [place] given by Lewanda Rife in left arm. Patient tolerated injection well. Patient will make appointment for 6 month.

## 2023-05-10 ENCOUNTER — Encounter: Payer: Self-pay | Admitting: Family Medicine

## 2023-05-13 ENCOUNTER — Encounter: Payer: Self-pay | Admitting: Family Medicine

## 2023-05-24 ENCOUNTER — Other Ambulatory Visit: Payer: Self-pay | Admitting: Family Medicine

## 2023-05-24 DIAGNOSIS — Z1231 Encounter for screening mammogram for malignant neoplasm of breast: Secondary | ICD-10-CM

## 2023-07-08 ENCOUNTER — Telehealth: Payer: Self-pay | Admitting: *Deleted

## 2023-07-08 DIAGNOSIS — R7303 Prediabetes: Secondary | ICD-10-CM

## 2023-07-08 DIAGNOSIS — E782 Mixed hyperlipidemia: Secondary | ICD-10-CM

## 2023-07-08 NOTE — Telephone Encounter (Signed)
-----   Message from Lovena Neighbours sent at 07/08/2023  8:58 AM EST ----- Regarding: Labs for Tuesday 3.4.25 Please put lab orders in future. Thank you, Denny Peon

## 2023-07-16 ENCOUNTER — Encounter: Payer: Self-pay | Admitting: Family Medicine

## 2023-07-16 ENCOUNTER — Other Ambulatory Visit (INDEPENDENT_AMBULATORY_CARE_PROVIDER_SITE_OTHER): Payer: Medicare HMO

## 2023-07-16 DIAGNOSIS — E782 Mixed hyperlipidemia: Secondary | ICD-10-CM

## 2023-07-16 DIAGNOSIS — R7303 Prediabetes: Secondary | ICD-10-CM

## 2023-07-16 LAB — LIPID PANEL
Cholesterol: 150 mg/dL (ref 0–200)
HDL: 60.7 mg/dL (ref >39.00)
LDL Cholesterol: 75 mg/dL (ref 0–99)
NonHDL: 88.81
Total CHOL/HDL Ratio: 2
Triglycerides: 70 mg/dL (ref 0.0–149.0)
VLDL: 14 mg/dL (ref 0.0–40.0)

## 2023-07-16 LAB — COMPREHENSIVE METABOLIC PANEL
ALT: 11 U/L (ref 0–35)
AST: 13 U/L (ref 0–37)
Albumin: 4.1 g/dL (ref 3.5–5.2)
Alkaline Phosphatase: 39 U/L (ref 39–117)
BUN: 11 mg/dL (ref 6–23)
CO2: 27 meq/L (ref 19–32)
Calcium: 10.2 mg/dL (ref 8.4–10.5)
Chloride: 104 meq/L (ref 96–112)
Creatinine, Ser: 0.71 mg/dL (ref 0.40–1.20)
GFR: 82.59 mL/min (ref >60.00)
Glucose, Bld: 113 mg/dL — ABNORMAL HIGH (ref 70–99)
Potassium: 4.4 meq/L (ref 3.5–5.1)
Sodium: 138 meq/L (ref 135–145)
Total Bilirubin: 0.5 mg/dL (ref 0.2–1.2)
Total Protein: 7 g/dL (ref 6.0–8.3)

## 2023-07-16 LAB — HEMOGLOBIN A1C: Hgb A1c MFr Bld: 5.6 % (ref 4.6–6.5)

## 2023-07-16 NOTE — Progress Notes (Signed)
 No critical labs need to be addressed urgently. We will discuss labs in detail at upcoming office visit.

## 2023-07-21 ENCOUNTER — Other Ambulatory Visit: Payer: Self-pay | Admitting: Family Medicine

## 2023-07-22 ENCOUNTER — Ambulatory Visit (INDEPENDENT_AMBULATORY_CARE_PROVIDER_SITE_OTHER): Payer: Medicare HMO

## 2023-07-22 VITALS — Ht 65.5 in | Wt 127.0 lb

## 2023-07-22 DIAGNOSIS — Z0000 Encounter for general adult medical examination without abnormal findings: Secondary | ICD-10-CM

## 2023-07-22 NOTE — Progress Notes (Signed)
 Subjective:   Yesenia Jones is a 78 y.o. who presents for a Medicare Wellness preventive visit.  Visit Complete: Virtual I connected with  [name] on 07/22/23 by a audio enabled telemedicine application and verified that I am speaking with the correct person using two identifiers.  Patient Location: Home  Provider Location: Home Office  I discussed the limitations of evaluation and management by telemedicine. The patient expressed understanding and agreed to proceed.  Vital Signs: Because this visit was a virtual/telehealth visit, some criteria may be missing or patient reported. Any vitals not documented were not able to be obtained and vitals that have been documented are patient reported.  VideoDeclined- This patient declined Librarian, academic. Therefore the visit was completed with audio only.  AWV Questionnaire: Yes: Patient Medicare AWV questionnaire was completed by the patient on 07/16/23; I have confirmed that all information answered by patient is correct and no changes since this date.  Cardiac Risk Factors include: advanced age (>61men, >74 women);dyslipidemia     Objective:    Today's Vitals   07/22/23 1055  Weight: 127 lb (57.6 kg)  Height: 5' 5.5" (1.664 m)   Body mass index is 20.81 kg/m.     07/22/2023   11:06 AM 07/17/2022   10:59 AM 12/10/2019    9:24 AM 11/05/2019    9:16 AM 07/09/2019   10:35 AM 07/03/2018    8:38 AM 01/31/2018    1:56 PM  Advanced Directives  Does Patient Have a Medical Advance Directive? No No No No No No No  Would patient like information on creating a medical advance directive?  No - Patient declined No - Patient declined No - Patient declined No - Patient declined No - Patient declined No - Patient declined    Current Medications (verified) Outpatient Encounter Medications as of 07/22/2023  Medication Sig   acetaminophen (TYLENOL) 500 MG tablet Take 1,000 mg by mouth 2 (two) times daily as needed  for moderate pain.   aspirin EC 81 MG tablet Take 81 mg by mouth daily. Swallow whole.   atorvastatin (LIPITOR) 20 MG tablet TAKE 1 TABLET BY MOUTH EVERYDAY AT BEDTIME   Calcium Carbonate-Vitamin D (CALCIUM 600+D PO) Take 1 tablet by mouth 2 (two) times daily.   Cholecalciferol (VITAMIN D-3) 1000 UNITS CAPS Take 1,000 Units by mouth daily.    Coenzyme Q10 (COQ-10) 100 MG capsule Take 100 mg by mouth daily.   denosumab (PROLIA) 60 MG/ML SOSY injection Inject 60 mg into the skin every 6 (six) months.   dextromethorphan-guaiFENesin (MUCINEX DM) 30-600 MG 12hr tablet Take 1 tablet by mouth 2 (two) times daily as needed for cough.   diphenhydramine-acetaminophen (TYLENOL PM) 25-500 MG TABS tablet Take 1 tablet by mouth at bedtime as needed (sleep).   fluticasone (FLONASE) 50 MCG/ACT nasal spray Place 2 sprays into both nostrils daily.   gabapentin (NEURONTIN) 600 MG tablet Take 1 tablet (600 mg total) by mouth 3 (three) times daily.   ibuprofen (ADVIL,MOTRIN) 200 MG tablet Take 600 mg by mouth 2 (two) times daily as needed for headache or moderate pain.   Multiple Vitamins-Minerals (CENTRUM SILVER ADULT 50+ PO) Take 1 tablet by mouth daily.   Propylene Glycol (SYSTANE BALANCE OP) Apply to eye in the morning, at noon, in the evening, and at bedtime.   Psyllium (METAMUCIL FIBER PO) Take 1 Dose by mouth every evening.   Facility-Administered Encounter Medications as of 07/22/2023  Medication   denosumab (PROLIA) injection  60 mg   denosumab (PROLIA) injection 60 mg   [START ON 11/07/2023] denosumab (PROLIA) injection 60 mg    Allergies (verified) Hydrocodone-acetaminophen, Oxycodone-acetaminophen, and Penicillins   History: Past Medical History:  Diagnosis Date   Allergies    Allergy    Arthritis    Cataract    bilat removed    Complication of anesthesia    slow to wake   COVID-19 virus infection 06/2020   HOH (hard of hearing)    wearing bilateral hearing aids   Hyperlipidemia     Osteoporosis    Past Surgical History:  Procedure Laterality Date   CATARACT EXTRACTION W/PHACO Left 11/05/2019   Procedure: CATARACT EXTRACTION PHACO AND INTRAOCULAR LENS PLACEMENT (IOC) LEFT 7.72  00:49.9;  Surgeon: Elliot Cousin, MD;  Location: [facility];  Service: Ophthalmology;  Laterality: Left;   CATARACT EXTRACTION W/PHACO Right 12/10/2019   Procedure: CATARACT EXTRACTION PHACO AND INTRAOCULAR LENS PLACEMENT (IOC) RIGHT 7.62  00:42.2;  Surgeon: Elliot Cousin, MD;  Location: [facility];  Service: Ophthalmology;  Laterality: Right;   CHOLECYSTECTOMY     COLONOSCOPY     EYE SURGERY  retina detachment   JOINT REPLACEMENT     KNEE SURGERY Left    scope   TOTAL KNEE ARTHROPLASTY Right 01/31/2018   Procedure: RIGHT TOTAL KNEE ARTHROPLASTY;  Surgeon: Kathryne Hitch, MD;  Location: [facility];  Service: Orthopedics;  Laterality: Right;   TOTAL KNEE ARTHROPLASTY Left    TUBAL LIGATION     Family History  Problem Relation Age of Onset   Stroke Mother    Hyperlipidemia Mother    Hyperlipidemia Father    Heart disease Father    Cancer Maternal Aunt        breast cancer   Colon polyps Neg Hx    Colon cancer Neg Hx    Esophageal cancer Neg Hx    Rectal cancer Neg Hx    Stomach cancer Neg Hx    Social History   Socioeconomic History   Marital status: Married    Spouse name: Not on file   Number of children: 2   Years of education: Not on file   Highest education level: Not on file  Occupational History   Occupation: retired for [employer]: RETIRED  Tobacco Use   Smoking status: Never    Passive exposure: Past   Smokeless tobacco: Never  Vaping Use   Vaping status: Never Used  Substance and Sexual Activity   Alcohol use: Yes    Alcohol/week: 7.0 standard drinks of alcohol    Types: 7 Glasses of wine per week   Drug use: No   Sexual activity: Not on file  Other Topics Concern   Not on file  Social History Narrative   Regular exercise-yes    Diet: fruits and veggies,water   Social Drivers of Health   Financial Resource Strain: Low Risk  (07/22/2023)   Overall Financial Resource Strain (CARDIA)    Difficulty of Paying Living Expenses: Not hard at all  Food Insecurity: No Food Insecurity (07/22/2023)   Hunger Vital Sign    Worried About Running Out of Food in the Last Year: Never true    Ran Out of Food in the Last Year: Never true  Transportation Needs: No Transportation Needs (07/22/2023)   PRAPARE - Administrator, Civil Service (Medical): No    Lack of Transportation (Non-Medical): No  Physical Activity: Sufficiently Active (07/22/2023)   Exercise Vital Sign  Days of Exercise per Week: 5 days    Minutes of Exercise per Session: 60 min  Stress: No Stress Concern Present (07/22/2023)   [employer] of Occupational Health - Occupational Stress Questionnaire    Feeling of Stress : Not at all  Social Connections: Moderately Integrated (07/22/2023)   Social Connection and Isolation Panel [NHANES]    Frequency of Communication with Friends and Family: More than three times a week    Frequency of Social Gatherings with Friends and Family: More than three times a week    Attends Religious Services: Never    Database administrator or Organizations: Yes    Attends Engineer, structural: More than 4 times per year    Marital Status: Married    Tobacco Counseling Counseling given: Not Answered    Clinical Intake:  Pre-visit preparation completed: Yes  Pain : No/denies pain    BMI - recorded: 20.81 Nutritional Status: BMI of 19-24  Normal Nutritional Risks: None Diabetes: No  How often do you need to have someone help you when you read instructions, pamphlets, or other written materials from your doctor or pharmacy?: 1 - Never  Interpreter Needed?: No  Comments: others live with patient Information entered by :: B.Georgia Baria,LPN   Activities of Daily Living     07/16/2023    1:39 PM  In  your present state of health, do you have any difficulty performing the following activities:  Hearing? 0  Vision? 0  Difficulty concentrating or making decisions? 0  Walking or climbing stairs? 0  Dressing or bathing? 0  Doing errands, shopping? 0  Preparing Food and eating ? N  Using the Toilet? N  In the past six months, have you accidently leaked urine? N  Do you have problems with loss of bowel control? N  Managing your Medications? N  Managing your Finances? N  Housekeeping or managing your Housekeeping? N    Patient Care Team: Excell Seltzer, MD as PCP - General (Family [facility]) [facility], [facility]  Indicate any recent Medical Services you may have received from other than [facility] providers in the past year (date may be approximate).     Assessment:   This is a routine wellness examination for Obelia.  Hearing/Vision screen Hearing Screening - Comments:: Pt says hearing good w/hearing aids Vision Screening - Comments:: Pt says her vision is ok [facility]    Goals Addressed               This Visit's Progress     Increase physical activity   On track     07/22/23- I will continue to exercise for 45 minutes 5 days per week.       COMPLETED: Patient Stated   On track     Continue to work out.      Patient Stated (pt-stated)        I would like to control my blood sugars through diet and exercise       Depression Screen     07/22/2023   11:03 AM 07/17/2022   10:58 AM 07/18/2021    9:28 AM 07/12/2020    8:48 AM 07/09/2019   10:39 AM 07/03/2018    8:24 AM 06/21/2017   10:12 AM  PHQ 2/9 Scores  PHQ - 2 Score 0 0 0 0 0 0 0  PHQ- 9 Score     0 0 0    Fall Risk     07/16/2023  1:39 PM 07/17/2022   11:00 AM 07/18/2021    9:28 AM 07/12/2020    8:48 AM 07/09/2019   10:36 AM  Fall Risk   Falls in the past year? 0 0 0 0 1  Comment     tripped over throw rug  Number falls in past yr: 0 0   0  Injury with Fall? 0 0   1  Comment     hurt shoulder and knee   Risk for fall due to : No Fall Risks No Fall Risks   Medication side effect  Follow up Falls prevention discussed;Education provided Falls prevention discussed;Falls evaluation completed   Falls evaluation completed;Falls prevention discussed    MEDICARE RISK AT HOME:  Medicare Risk at Home Any stairs in or around the home?: (Patient-Rptd) Yes If so, are there any without handrails?: (Patient-Rptd) No Home free of loose throw rugs in walkways, pet beds, electrical cords, etc?: (Patient-Rptd) Yes Adequate lighting in your home to reduce risk of falls?: (Patient-Rptd) Yes Life alert?: (Patient-Rptd) No Grab bars in the bathroom?: (Patient-Rptd) Yes Shower chair or bench in shower?: (Patient-Rptd) No Elevated toilet seat or a handicapped toilet?: (Patient-Rptd) Yes  TIMED UP AND GO:  Was the test performed?  No  Cognitive Function: 6CIT completed    07/09/2019   10:42 AM 07/03/2018    8:23 AM 06/21/2017   10:12 AM  MMSE - Mini Mental State Exam  Orientation to time 5 5 5   Orientation to Place 5 5 5   Registration 3 3 3   Attention/ Calculation 5 0 0  Recall 3 3 3   Language- name 2 objects  0 0  Language- repeat 1 1 1   Language- follow 3 step command  3 3  Language- read & follow direction  0 0  Write a sentence  0 0  Copy design  0 0  Total score  20 20        07/22/2023   11:10 AM 07/17/2022   11:02 AM  6CIT Screen  What Year? 0 points 0 points  What month? 0 points 0 points  What time? 0 points 0 points  Count back from 20 0 points 0 points  Months in reverse 0 points 0 points  Repeat phrase 0 points 0 points  Total Score 0 points 0 points    Immunizations Immunization History  Administered Date(s) Administered   Fluad Quad(high Dose 65+) 02/14/2021   Influenza Split 03/23/2011   Influenza Whole 03/11/2008, 03/17/2010   Influenza, High Dose Seasonal PF 03/03/2014, 03/15/2018, 12/30/2018, 02/12/2020   Influenza,inj,Quad PF,6+ Mos 03/08/2015, 02/27/2016    Influenza-Unspecified 02/11/2013, 03/01/2017   PFIZER(Purple Top)SARS-COV-2 Vaccination 06/05/2019, 06/25/2019, 05/24/2020   Pfizer Covid-19 Vaccine Bivalent Booster 72yrs & up 03/23/2021   Pneumococcal Conjugate-13 05/28/2014, 02/27/2016   Pneumococcal Polysaccharide-23 03/25/2008, 06/21/2017   Td 07/21/2007, 06/21/2017   Zoster Recombinant(Shingrix) 07/12/2020, 10/12/2020   Zoster, Live 04/12/2008    Screening Tests Health Maintenance  Topic Date Due   INFLUENZA VACCINE  12/13/2022   COVID-19 Vaccine (5 - 2024-25 season) 01/13/2023   MAMMOGRAM  09/05/2023   Medicare Annual Wellness (AWV)  07/21/2024   DEXA SCAN  09/04/2024   DTaP/Tdap/Td (3 - Tdap) 06/22/2027   Pneumonia Vaccine 35+ Years old  Completed   Hepatitis C Screening  Completed   Zoster Vaccines- Shingrix  Completed   HPV VACCINES  Aged Out   Colonoscopy  Discontinued    Health Maintenance  Health Maintenance Due  Topic Date Due   INFLUENZA  VACCINE  12/13/2022   COVID-19 Vaccine (5 - 2024-25 season) 01/13/2023   Health Maintenance Items Addressed:  Additional Screening:  Vision Screening: Recommended annual ophthalmology exams for early detection of glaucoma and other disorders of the eye.  Dental Screening: Recommended annual dental exams for proper oral hygiene  Community Resource Referral / Chronic Care Management: CRR required this visit?  No   CCM required this visit?  No     Plan:     I have personally reviewed and noted the following in the patient's chart:   Medical and social history Use of alcohol, tobacco or illicit drugs  Current medications and supplements including opioid prescriptions. Patient is not currently taking opioid prescriptions. Functional ability and status Nutritional status Physical activity Advanced directives List of other physicians Hospitalizations, surgeries, and ER visits in previous 12 months Vitals Screenings to include cognitive, depression, and  falls Referrals and appointments  In addition, I have reviewed and discussed with patient certain preventive protocols, quality metrics, and best practice recommendations. A written personalized care plan for preventive services as well as general preventive health recommendations were provided to patient.    Sue Lush, LPN   1/61/0960   After Visit Summary: (MyChart) Due to this being a telephonic visit, the after visit summary with patients personalized plan was offered to patient via MyChart   Notes: Nothing significant to report at this time.

## 2023-07-22 NOTE — Patient Instructions (Signed)
 Yesenia Jones , Thank you for taking time to come for your Medicare Wellness Visit. I appreciate your ongoing commitment to your health goals. Please review the following plan we discussed and let me know if I can assist you in the future.   Referrals/Orders/Follow-Ups/Clinician Recommendations: none  This is a list of the screening recommended for you and due dates:  Health Maintenance  Topic Date Due   Flu Shot  12/13/2022   COVID-19 Vaccine (5 - 2024-25 season) 01/13/2023   Mammogram  09/05/2023   Medicare Annual Wellness Visit  07/21/2024   DEXA scan (bone density measurement)  09/04/2024   DTaP/Tdap/Td vaccine (3 - Tdap) 06/22/2027   Pneumonia Vaccine  Completed   Hepatitis C Screening  Completed   Zoster (Shingles) Vaccine  Completed   HPV Vaccine  Aged Out   Colon Cancer Screening  Discontinued    Advanced directives: (Declined) Advance directive discussed with you today. Even though you declined this today, please call our office should you change your mind, and we can give you the proper paperwork for you to fill out.  Next Medicare Annual Wellness Visit scheduled for next year: Yes 07/23/2024 @ 10:50am televisit

## 2023-07-23 ENCOUNTER — Encounter: Payer: Self-pay | Admitting: Family Medicine

## 2023-07-23 ENCOUNTER — Ambulatory Visit (INDEPENDENT_AMBULATORY_CARE_PROVIDER_SITE_OTHER): Payer: Medicare HMO | Admitting: Family Medicine

## 2023-07-23 VITALS — BP 110/84 | HR 93 | Temp 98.2°F | Ht 65.25 in | Wt 124.2 lb

## 2023-07-23 DIAGNOSIS — M7061 Trochanteric bursitis, right hip: Secondary | ICD-10-CM | POA: Diagnosis not present

## 2023-07-23 DIAGNOSIS — M810 Age-related osteoporosis without current pathological fracture: Secondary | ICD-10-CM

## 2023-07-23 DIAGNOSIS — E782 Mixed hyperlipidemia: Secondary | ICD-10-CM | POA: Diagnosis not present

## 2023-07-23 DIAGNOSIS — Z0000 Encounter for general adult medical examination without abnormal findings: Secondary | ICD-10-CM | POA: Diagnosis not present

## 2023-07-23 DIAGNOSIS — R7303 Prediabetes: Secondary | ICD-10-CM

## 2023-07-23 NOTE — Assessment & Plan Note (Signed)
Stable, chronic.  Continue current medication. ? ? ?Atorvastatin 20 mg daily ?

## 2023-07-23 NOTE — Assessment & Plan Note (Signed)
 Can start home exercises for right hip, can use short course of ibuprofen 800 mg Three times daily to help with inflammation.

## 2023-07-23 NOTE — Assessment & Plan Note (Signed)
 Chronic, improvement in past overtime on Prolia.  Recommend weight bearing exercise, calcium in diet and vit D supplement 400 IU 1-2 times daily.

## 2023-07-23 NOTE — Progress Notes (Signed)
 Patient ID: Yesenia Jones, female    DOB: 25-Mar-1946, 79 y.o.   MRN: [id number]  This visit was conducted in person.  BP 110/84 (BP Location: Left Arm, Patient Position: Sitting, Cuff Size: Normal)   Pulse 93   Temp 98.2 F (36.8 C) (Temporal)   Ht 5' 5.25" (1.657 m)   Wt 124 lb 4 oz (56.4 kg)   SpO2 95%   BMI 20.52 kg/m    CC:  Chief Complaint  Patient presents with   Annual Exam    Part 2    Subjective:   HPI: Yesenia Jones is a 78 y.o. female presenting on 07/23/2023 for Annual Exam (Part 2)  The patient presents for  complete physical and review of chronic health problems. He/She also has the following: none   Some right lateral hip pain... has lidocaine patches.  The patient saw a LPN or RN for medicare wellness visit. 07/22/2023  Prevention and wellness was reviewed in detail. Note reviewed and important notes copied below.   Wt Readings from Last 3 Encounters:  07/23/23 124 lb 4 oz (56.4 kg)  07/22/23 127 lb (57.6 kg)  01/30/23 127 lb (57.6 kg)      Elevated Cholesterol:  At goal on atorvastatin 20 mg daily.. now using CoQ10. Lab Results  Component Value Date   CHOL 150 07/16/2023   HDL 60.70 07/16/2023   LDLCALC 75 07/16/2023   LDLDIRECT 149.3 04/06/2013   TRIG 70.0 07/16/2023   CHOLHDL 2 07/16/2023  Using medications without problems: none Muscle aches:  none Diet compliance: healthy diet Exercise:  treadmill 3 times a week 30-45 min, also weights 2 days a week. Other complaints:  Prediabetes  Lab Results  Component Value Date   HGBA1C 5.6 07/16/2023         Relevant past medical, surgical, family and social history reviewed and updated as indicated. Interim medical history since our last visit reviewed. Allergies and medications reviewed and updated. Outpatient Medications Prior to Visit  Medication Sig Dispense Refill   acetaminophen (TYLENOL) 500 MG tablet Take 1,000 mg by mouth 2 (two) times daily as needed for moderate pain.      aspirin EC 81 MG tablet Take 81 mg by mouth daily. Swallow whole.     atorvastatin (LIPITOR) 20 MG tablet TAKE 1 TABLET BY MOUTH EVERYDAY AT BEDTIME 90 tablet 3   Calcium Carbonate-Vitamin D (CALCIUM 600+D PO) Take 1 tablet by mouth 2 (two) times daily.     Cholecalciferol (VITAMIN D-3) 1000 UNITS CAPS Take 1,000 Units by mouth daily.      Coenzyme Q10 (COQ-10) 100 MG capsule Take 100 mg by mouth daily.     denosumab (PROLIA) 60 MG/ML SOSY injection Inject 60 mg into the skin every 6 (six) months.     dextromethorphan-guaiFENesin (MUCINEX DM) 30-600 MG 12hr tablet Take 1 tablet by mouth 2 (two) times daily as needed for cough.     diphenhydramine-acetaminophen (TYLENOL PM) 25-500 MG TABS tablet Take 1 tablet by mouth at bedtime as needed (sleep).     fluticasone (FLONASE) 50 MCG/ACT nasal spray Place 2 sprays into both nostrils daily. 16 g 6   gabapentin (NEURONTIN) 600 MG tablet Take 1 tablet (600 mg total) by mouth 3 (three) times daily. 270 tablet 3   ibuprofen (ADVIL,MOTRIN) 200 MG tablet Take 600 mg by mouth 2 (two) times daily as needed for headache or moderate pain.     Multiple Vitamins-Minerals (CENTRUM SILVER ADULT 50+ PO) Take  1 tablet by mouth daily.     Propylene Glycol (SYSTANE BALANCE OP) Apply to eye in the morning, at noon, in the evening, and at bedtime.     Psyllium (METAMUCIL FIBER PO) Take 1 Dose by mouth every evening.     Facility-Administered Medications Prior to Visit  Medication Dose Route Frequency Provider Last Rate Last Admin   denosumab (PROLIA) injection 60 mg  60 mg Subcutaneous Q6 months Kohler Pellerito E, MD       denosumab (PROLIA) injection 60 mg  60 mg Subcutaneous Once Kc Sedlak E, MD       [START ON 11/07/2023] denosumab (PROLIA) injection 60 mg  60 mg Subcutaneous Once Evangelynn Lochridge E, MD         Per HPI unless specifically indicated in ROS section below Review of Systems  Constitutional:  Negative for fatigue and fever.  HENT:  Negative for  congestion.   Eyes:  Negative for pain.  Respiratory:  Negative for cough and shortness of breath.   Cardiovascular:  Negative for chest pain, palpitations and leg swelling.  Gastrointestinal:  Negative for abdominal pain.  Genitourinary:  Negative for dysuria and vaginal bleeding.  Musculoskeletal:  Negative for back pain.  Neurological:  Negative for syncope, light-headedness and headaches.  Psychiatric/Behavioral:  Negative for dysphoric mood.    Objective:  BP 110/84 (BP Location: Left Arm, Patient Position: Sitting, Cuff Size: Normal)   Pulse 93   Temp 98.2 F (36.8 C) (Temporal)   Ht 5' 5.25" (1.657 m)   Wt 124 lb 4 oz (56.4 kg)   SpO2 95%   BMI 20.52 kg/m   Wt Readings from Last 3 Encounters:  07/23/23 124 lb 4 oz (56.4 kg)  07/22/23 127 lb (57.6 kg)  01/30/23 127 lb (57.6 kg)      Physical Exam Constitutional:      General: She is not in acute distress.    Appearance: Normal appearance. She is well-developed. She is not ill-appearing or toxic-appearing.  HENT:     Head: Normocephalic.     Right Ear: Hearing, tympanic membrane, ear canal and external ear normal. Tympanic membrane is not erythematous, retracted or bulging.     Left Ear: Hearing, tympanic membrane, ear canal and external ear normal. Tympanic membrane is not erythematous, retracted or bulging.     Nose: No mucosal edema or rhinorrhea.     Right Sinus: No maxillary sinus tenderness or frontal sinus tenderness.     Left Sinus: No maxillary sinus tenderness or frontal sinus tenderness.     Mouth/Throat:     Pharynx: Uvula midline.  Eyes:     General: Lids are normal. Lids are everted, no foreign bodies appreciated.     Conjunctiva/sclera: Conjunctivae normal.     Pupils: Pupils are equal, round, and reactive to light.  Neck:     Thyroid: No thyroid mass or thyromegaly.     Vascular: No carotid bruit.     Trachea: Trachea normal.  Cardiovascular:     Rate and Rhythm: Normal rate and regular rhythm.      Pulses: Normal pulses.     Heart sounds: Normal heart sounds, S1 normal and S2 normal. No murmur heard.    No friction rub. No gallop.  Pulmonary:     Effort: Pulmonary effort is normal. No tachypnea or respiratory distress.     Breath sounds: Normal breath sounds. No decreased breath sounds, wheezing, rhonchi or rales.  Abdominal:     General: Bowel sounds are  normal.     Palpations: Abdomen is soft.     Tenderness: There is no abdominal tenderness.  Musculoskeletal:     Cervical back: Normal range of motion and neck supple.     Right hip: Tenderness present. No bony tenderness. Decreased range of motion.     Left hip: No tenderness or bony tenderness. Decreased range of motion.     Comments: Ttp over lateral hip  Skin:    General: Skin is warm and dry.     Findings: No rash.  Neurological:     Mental Status: She is alert.  Psychiatric:        Mood and Affect: Mood is not anxious or depressed.        Speech: Speech normal.        Behavior: Behavior normal. Behavior is cooperative.        Thought Content: Thought content normal.        Judgment: Judgment normal.       Results for orders placed or performed in visit on 07/16/23  Lipid panel   Collection Time: 07/16/23  7:32 AM  Result Value Ref Range   Cholesterol 150 0 - 200 mg/dL   Triglycerides 16.1 0.0 - 149.0 mg/dL   HDL 09.60 >45.40 mg/dL   VLDL 98.1 0.0 - 19.1 mg/dL   LDL Cholesterol 75 0 - 99 mg/dL   Total CHOL/HDL Ratio 2    NonHDL 88.81   Hemoglobin A1c   Collection Time: 07/16/23  7:32 AM  Result Value Ref Range   Hgb A1c MFr Bld 5.6 4.6 - 6.5 %  Comprehensive metabolic panel   Collection Time: 07/16/23  7:32 AM  Result Value Ref Range   Sodium 138 135 - 145 mEq/L   Potassium 4.4 3.5 - 5.1 mEq/L   Chloride 104 96 - 112 mEq/L   CO2 27 19 - 32 mEq/L   Glucose, Bld 113 (H) 70 - 99 mg/dL   BUN 11 6 - 23 mg/dL   Creatinine, Ser 4.78 0.40 - 1.20 mg/dL   Total Bilirubin 0.5 0.2 - 1.2 mg/dL   Alkaline  Phosphatase 39 39 - 117 U/L   AST 13 0 - 37 U/L   ALT 11 0 - 35 U/L   Total Protein 7.0 6.0 - 8.3 g/dL   Albumin 4.1 3.5 - 5.2 g/dL   GFR 29.56 >21.30 mL/min   Calcium 10.2 8.4 - 10.5 mg/dL    This visit occurred during the SARS-CoV-2 public health emergency.  Safety protocols were in place, including screening questions prior to the visit, additional usage of staff PPE, and extensive cleaning of exam room while observing appropriate contact time as indicated for disinfecting solutions.   COVID 19 screen:  No recent travel or known exposure to COVID19 The patient denies respiratory symptoms of COVID 19 at this time. The importance of social distancing was discussed today.   Assessment and Plan The patient's preventative maintenance and recommended screening tests for an annual wellness exam were reviewed in full today. Brought up to date unless services declined.  Counselled on the importance of diet, exercise, and its role in overall health and mortality. The patient's FH and SH was reviewed, including their home life, tobacco status, and drug and alcohol status.     Prolia due in  June   Vaccines:  Up to date COVID vaccine x 4,  PNA, high dose flu, shingrix, RSV. Colon:  09/2020 no polyps..  no further indicated. Mammo scheduled 08/2023 DXA:  osteopenia 02/2009 on fosamax (on for 5-6 years, never diagnosed with osteoporosis)... Stopped in May 2013 when she ran out.Marland Kitchen  DEXA 04/22/2013: osteoporosis, started back on fosamax. Recheck in 04/2015: worsened.. Started on  prolia 05/2015   04/2017 on yr 2 prolia..Improvement in spine, none yet in hip,  04/2019 yr 4 prolia, improvement,  12./2022 showed osteopenia T-2.4, stable 2024 -2.2 PAP/DVE: Previously done at [facility], but they retired. Last PAP done nml 2011, all previous were normal 3 in a row,has history of abnormal pap and crypothrapy 20 years ago, No further paps needed,   Hep C:neg   Problem List Items Addressed This Visit      Hyperlipidemia (Chronic)   Stable, chronic.  Continue current medication.   Atorvastatin  20 mg daily      Osteoporosis (Chronic)    Chronic, improvement in past overtime on Prolia.  Recommend weight bearing exercise, calcium in diet and vit D supplement 400 IU 1-2 times daily.        Prediabetes (Chronic)   Encouraged exercise, weight loss, healthy eating habits.       Trochanteric bursitis of right hip   Other Visit Diagnoses       Routine general medical examination at a health care facility    -  Primary        Kerby Nora, MD

## 2023-07-23 NOTE — Assessment & Plan Note (Signed)
 Encouraged exercise, weight loss, healthy eating habits. ? ?

## 2023-07-23 NOTE — Patient Instructions (Signed)
 Can start home exercises for right hip, can use short course of ibuprofen 800 mg Three times daily to help with inflammation.

## 2023-09-06 ENCOUNTER — Ambulatory Visit
Admission: RE | Admit: 2023-09-06 | Discharge: 2023-09-06 | Disposition: A | Discharge status: Home / Self Care | Payer: Medicare HMO | Source: Ambulatory Visit | Attending: Family Medicine | Admitting: Family Medicine

## 2023-09-06 DIAGNOSIS — Z1231 Encounter for screening mammogram for malignant neoplasm of breast: Secondary | ICD-10-CM | POA: Diagnosis not present

## 2023-10-08 ENCOUNTER — Telehealth: Payer: Self-pay

## 2023-10-08 ENCOUNTER — Other Ambulatory Visit (HOSPITAL_COMMUNITY): Payer: Self-pay

## 2023-10-08 NOTE — Telephone Encounter (Signed)
 Prolia  VOB initiated via [email or web address]  Next Prolia  inj DUE: 11/06/23   PHARMACY BENEFIT: $645.79

## 2023-10-08 NOTE — Telephone Encounter (Signed)
 Yesenia Jones

## 2023-10-08 NOTE — Telephone Encounter (Signed)
 PA for buy and bill submitted via [employer]. Authorization Number : [id number]

## 2023-10-08 NOTE — Telephone Encounter (Signed)
 Pt ready for scheduling for PROLIA  on or after : 11/06/23  Option# 1: Buy/Bill (Office supplied medication)  Out-of-pocket cost due at time of clinic visit: $357  Number of injection/visits approved: 2  Primary: [employer] Prolia  co-insurance: 20% Admin fee co-insurance: 20%  Secondary: --- Prolia  co-insurance:  Admin fee co-insurance:   Medical Benefit Details: Date Benefits were checked: 10/08/23 Deductible: NO/ Coinsurance: 20%/ Admin Fee: 20%  Prior Auth: APPROVED PA# [id number] Expiration Date: 10/08/23-10/07/24   # of doses approved: 2 ----------------------------------------------------------------------- Option# 2- Med Obtained from pharmacy:  Pharmacy benefit: Copay $645.79 (Paid to pharmacy) Admin Fee: 20% (Pay at clinic)  Prior Auth: N/A PA# Expiration Date:   # of doses approved:   If patient wants fill through the pharmacy benefit please send prescription to: [facility], and include estimated need by date in rx notes. Pharmacy will ship medication directly to the office.  Patient NOT eligible for Prolia  Copay Card. Copay Card can make patient's cost as little as $25. Link to apply: [email or web address]://[email or web address]  ** This summary of benefits is an estimation of the patient's out-of-pocket cost. Exact cost may very based on individual plan coverage.

## 2023-10-17 DIAGNOSIS — H33011 Retinal detachment with single break, right eye: Secondary | ICD-10-CM | POA: Diagnosis not present

## 2023-10-17 DIAGNOSIS — H53002 Unspecified amblyopia, left eye: Secondary | ICD-10-CM | POA: Diagnosis not present

## 2023-10-17 DIAGNOSIS — H35371 Puckering of macula, right eye: Secondary | ICD-10-CM | POA: Diagnosis not present

## 2023-10-17 DIAGNOSIS — Z961 Presence of intraocular lens: Secondary | ICD-10-CM | POA: Diagnosis not present

## 2023-10-21 ENCOUNTER — Telehealth: Payer: Self-pay | Admitting: *Deleted

## 2023-10-21 DIAGNOSIS — M810 Age-related osteoporosis without current pathological fracture: Secondary | ICD-10-CM

## 2023-10-21 NOTE — Telephone Encounter (Signed)
-----   Message from Yesenia Jones sent at 10/21/2023  9:05 AM EDT ----- Regarding: Lab Mon. 11/04/23 Hello,   Patient has a lab appointment on 11/04/23 for Prolia  Labs. Can we get lab orders please.   Thanks

## 2023-10-24 ENCOUNTER — Encounter: Payer: Self-pay | Admitting: Family Medicine

## 2023-11-04 ENCOUNTER — Other Ambulatory Visit (INDEPENDENT_AMBULATORY_CARE_PROVIDER_SITE_OTHER)

## 2023-11-04 DIAGNOSIS — M810 Age-related osteoporosis without current pathological fracture: Secondary | ICD-10-CM

## 2023-11-04 LAB — BASIC METABOLIC PANEL WITH GFR
BUN: 13 mg/dL (ref 6–23)
CO2: 28 meq/L (ref 19–32)
Calcium: 10.3 mg/dL (ref 8.4–10.5)
Chloride: 102 meq/L (ref 96–112)
Creatinine, Ser: 0.77 mg/dL (ref 0.40–1.20)
GFR: 74.77 mL/min (ref >60.00)
Glucose, Bld: 99 mg/dL (ref 70–99)
Potassium: 4.3 meq/L (ref 3.5–5.1)
Sodium: 137 meq/L (ref 135–145)

## 2023-11-04 LAB — VITAMIN D 25 HYDROXY (VIT D DEFICIENCY, FRACTURES): VITD: 67.83 ng/mL (ref 30.00–100.00)

## 2023-11-05 ENCOUNTER — Ambulatory Visit: Payer: Self-pay | Admitting: *Deleted

## 2023-11-05 NOTE — Progress Notes (Signed)
 No critical labs need to be addressed urgently. We will discuss labs in detail at upcoming office visit.

## 2023-11-06 ENCOUNTER — Ambulatory Visit

## 2023-11-06 DIAGNOSIS — M810 Age-related osteoporosis without current pathological fracture: Secondary | ICD-10-CM

## 2023-11-06 MED ORDER — DENOSUMAB 60 MG/ML ~~LOC~~ SOSY: 60.0000 mg | PREFILLED_SYRINGE | Freq: Once | SUBCUTANEOUS | Status: AC

## 2023-11-06 MED ORDER — DENOSUMAB 60 MG/ML ~~LOC~~ SOSY
60.0000 mg | PREFILLED_SYRINGE | Freq: Once | SUBCUTANEOUS | Status: AC
  Administered 2023-11-06: 60 mg via SUBCUTANEOUS

## 2023-11-06 NOTE — Progress Notes (Signed)
 Per orders of Dr. Greig Ring, injection of Prolia  60 mg [place] given by Laray Arenas in right arm. Patient tolerated injection well. Patient will make appointment for 6 month.

## 2023-11-19 DIAGNOSIS — H5203 Hypermetropia, bilateral: Secondary | ICD-10-CM | POA: Diagnosis not present

## 2023-11-19 DIAGNOSIS — H53042 Amblyopia suspect, left eye: Secondary | ICD-10-CM | POA: Diagnosis not present

## 2023-11-19 DIAGNOSIS — H59811 Chorioretinal scars after surgery for detachment, right eye: Secondary | ICD-10-CM | POA: Diagnosis not present

## 2023-11-19 DIAGNOSIS — Z9842 Cataract extraction status, left eye: Secondary | ICD-10-CM | POA: Diagnosis not present

## 2023-11-19 DIAGNOSIS — Z9841 Cataract extraction status, right eye: Secondary | ICD-10-CM | POA: Diagnosis not present

## 2023-11-19 DIAGNOSIS — H52223 Regular astigmatism, bilateral: Secondary | ICD-10-CM | POA: Diagnosis not present

## 2023-12-22 ENCOUNTER — Ambulatory Visit
Admission: EM | Admit: 2023-12-22 | Discharge: 2023-12-22 | Disposition: A | Discharge status: Home / Self Care | Attending: Emergency Medicine | Admitting: Emergency Medicine

## 2023-12-22 DIAGNOSIS — S50862A Insect bite (nonvenomous) of left forearm, initial encounter: Secondary | ICD-10-CM | POA: Diagnosis not present

## 2023-12-22 DIAGNOSIS — W57XXXA Bitten or stung by nonvenomous insect and other nonvenomous arthropods, initial encounter: Secondary | ICD-10-CM | POA: Diagnosis not present

## 2023-12-22 DIAGNOSIS — Z23 Encounter for immunization: Secondary | ICD-10-CM

## 2023-12-22 DIAGNOSIS — L03114 Cellulitis of left upper limb: Secondary | ICD-10-CM | POA: Diagnosis not present

## 2023-12-22 MED ORDER — SULFAMETHOXAZOLE-TRIMETHOPRIM 800-160 MG PO TABS: 1.0000 | ORAL_TABLET | Freq: Two times a day (BID) | ORAL | 0 refills | Status: AC

## 2023-12-22 MED ORDER — TETANUS-DIPHTH-ACELL PERTUSSIS 5-2.5-18.5 LF-MCG/0.5 IM SUSY
0.5000 mL | PREFILLED_SYRINGE | Freq: Once | INTRAMUSCULAR | Status: AC
  Administered 2023-12-22: 0.5 mL via INTRAMUSCULAR

## 2023-12-22 NOTE — ED Triage Notes (Signed)
 Patient to Urgent Care with complaints of a possible insect bite present to the back of her left forearm. Denies any itching or pain. Large area of discoloration present. Feels like the area is spreading.   Noticed the area on Thursday.

## 2023-12-22 NOTE — Discharge Instructions (Addendum)
 Take the antibiotic as directed.  Follow up with your primary care provider tomorrow.  Go to the emergency department if you have worsening symptoms.

## 2023-12-22 NOTE — ED Provider Notes (Signed)
 [facility]    CSN: [id number] Arrival date & time: 12/22/23  0809      History   Chief Complaint Chief Complaint  Patient presents with   Insect Bite    HPI Yesenia Jones is a 78 y.o. female.  Patient presents with 3-day history of puncture wounds on her left forearm which she believes to be a spider bite.  The area has developed discoloration around it.  Patient reports no trauma.  The area is not painful.  No fever or drainage.  No treatments at home.  Last tetanus 06/21/2017.  The history is provided by the patient and medical records.    Past Medical History:  Diagnosis Date   Allergies    Allergy    Arthritis    Cataract    bilat removed    Complication of anesthesia    slow to wake   COVID-19 virus infection 06/2020   HOH (hard of hearing)    wearing bilateral hearing aids   Hyperlipidemia    Osteoporosis     Patient Active Problem List   Diagnosis Date Noted   Trochanteric bursitis of right hip 07/23/2023   History of retinal detachment 07/18/2021   Rotator cuff tendonitis, right 11/04/2018   Status post total knee replacement, right 01/31/2018   Tarsal tunnel syndrome of both lower extremities 02/07/2016   Prediabetes 06/17/2015   Hearing loss 04/04/2012   Tinnitus 04/04/2012   Hyperlipidemia 04/14/2010   MORTON'S NEUROMA 04/14/2010   TALIPES CAVUS 04/14/2010   Osteoporosis 03/17/2010   ALLERGIC RHINITIS 08/25/2008    Past Surgical History:  Procedure Laterality Date   CATARACT EXTRACTION W/PHACO Left 11/05/2019   Procedure: CATARACT EXTRACTION PHACO AND INTRAOCULAR LENS PLACEMENT (IOC) LEFT 7.72  00:49.9;  Surgeon: Ferol Rogue, MD;  Location: [facility];  Service: Ophthalmology;  Laterality: Left;   CATARACT EXTRACTION W/PHACO Right 12/10/2019   Procedure: CATARACT EXTRACTION PHACO AND INTRAOCULAR LENS PLACEMENT (IOC) RIGHT 7.62  00:42.2;  Surgeon: Ferol Rogue, MD;  Location: [facility];  Service: Ophthalmology;   Laterality: Right;   CHOLECYSTECTOMY     COLONOSCOPY     EYE SURGERY  retina detachment   JOINT REPLACEMENT     KNEE SURGERY Left    scope   TOTAL KNEE ARTHROPLASTY Right 01/31/2018   Procedure: RIGHT TOTAL KNEE ARTHROPLASTY;  Surgeon: Vernetta Lonni GRADE, MD;  Location: [facility];  Service: Orthopedics;  Laterality: Right;   TOTAL KNEE ARTHROPLASTY Left    TUBAL LIGATION      OB History   No obstetric history on file.      Home Medications    Prior to Admission medications   Medication Sig Start Date End Date Taking? Authorizing Provider  sulfamethoxazole -trimethoprim  (BACTRIM  DS) 800-160 MG tablet Take 1 tablet by mouth 2 (two) times daily for 7 days. 12/22/23 12/29/23 Yes Corlis Burnard DEL, NP  acetaminophen  (TYLENOL ) 500 MG tablet Take 1,000 mg by mouth 2 (two) times daily as needed for moderate pain.    [provider]  aspirin  EC 81 MG tablet Take 81 mg by mouth daily. Swallow whole.    [provider]  atorvastatin  (LIPITOR) 20 MG tablet TAKE 1 TABLET BY MOUTH EVERYDAY AT BEDTIME 07/22/23   Bedsole, Amy E, MD  Calcium  Carbonate-Vitamin D  (CALCIUM  600+D PO) Take 1 tablet by mouth 2 (two) times daily.    [provider]  Cholecalciferol  (VITAMIN D -3) 1000 UNITS CAPS Take 1,000 Units by mouth daily.  [provider]  Coenzyme Q10 (COQ-10) 100 MG capsule Take 100 mg by mouth daily.    [provider]  denosumab  (PROLIA ) 60 MG/ML SOSY injection Inject 60 mg into the skin every 6 (six) months.    [provider]  dextromethorphan-guaiFENesin (MUCINEX DM) 30-600 MG 12hr tablet Take 1 tablet by mouth 2 (two) times daily as needed for cough. Patient not taking: Reported on 12/22/2023    [provider]  diphenhydramine -acetaminophen  (TYLENOL  PM) 25-500 MG TABS tablet Take 1 tablet by mouth at bedtime as needed (sleep).    [provider]  fluticasone  (FLONASE ) 50 MCG/ACT nasal spray Place 2 sprays into both nostrils  daily. 07/10/19   Bedsole, Amy E, MD  gabapentin  (NEURONTIN ) 600 MG tablet Take 1 tablet (600 mg total) by mouth 3 (three) times daily. 01/22/23   Harvey Seltzer, MD  ibuprofen (ADVIL,MOTRIN) 200 MG tablet Take 600 mg by mouth 2 (two) times daily as needed for headache or moderate pain.    [provider]  Multiple Vitamins-Minerals (CENTRUM SILVER ADULT 50+ PO) Take 1 tablet by mouth daily.    [provider]  Propylene Glycol (SYSTANE BALANCE OP) Apply to eye in the morning, at noon, in the evening, and at bedtime.    [provider]  Psyllium (METAMUCIL FIBER PO) Take 1 Dose by mouth every evening.    [provider]    Family History Family History  Problem Relation Age of Onset   Stroke Mother    Hyperlipidemia Mother    Hyperlipidemia Father    Heart disease Father    Cancer Maternal Aunt        breast cancer   Colon polyps Neg Hx    Colon cancer Neg Hx    Esophageal cancer Neg Hx    Rectal cancer Neg Hx    Stomach cancer Neg Hx     Social History Social History   Tobacco Use   Smoking status: Never    Passive exposure: Past   Smokeless tobacco: Never  Vaping Use   Vaping status: Never Used  Substance Use Topics   Alcohol use: Yes    Alcohol/week: 7.0 standard drinks of alcohol    Types: 7 Glasses of wine per week   Drug use: No     Allergies   Hydrocodone-acetaminophen , Oxycodone-acetaminophen , and Penicillins   Review of Systems Review of Systems  Constitutional:  Negative for chills and fever.  Musculoskeletal:  Negative for arthralgias and joint swelling.  Skin:  Positive for color change and wound.  Neurological:  Negative for weakness and numbness.     Physical Exam Triage Vital Signs ED Triage Vitals  Encounter Vitals Group     BP      Girls Systolic BP Percentile      Girls Diastolic BP Percentile      Boys Systolic BP Percentile      Boys Diastolic BP Percentile      Pulse      Resp      Temp      Temp  src      SpO2      Weight      Height      Head Circumference      Peak Flow      Pain Score      Pain Loc      Pain Education      Exclude from Growth Chart    No data found.  Updated Vital Signs  BP 119/79   Pulse 76   Temp 97.8 F (36.6 C)   Resp 18   SpO2 96%   Visual Acuity Right Eye Distance:   Left Eye Distance:   Bilateral Distance:    Right Eye Near:   Left Eye Near:    Bilateral Near:     Physical Exam Constitutional:      General: She is not in acute distress. HENT:     Mouth/Throat:     Mouth: Mucous membranes are moist.  Cardiovascular:     Rate and Rhythm: Normal rate and regular rhythm.  Pulmonary:     Effort: Pulmonary effort is normal. No respiratory distress.  Musculoskeletal:        General: No swelling. Normal range of motion.  Skin:    General: Skin is warm and dry.     Capillary Refill: Capillary refill takes less than 2 seconds.     Findings: Lesion present.     Comments: Left forearm 2 small puncture wounds with surrounding 13 cm x 8 cm discoloration that appears to be ecchymosis.  See picture for details.  Neurological:     Mental Status: She is alert.     Sensory: No sensory deficit.     Motor: No weakness.      UC Treatments / Results  Labs (all labs ordered are listed, but only abnormal results are displayed) Labs Reviewed - No data to display  EKG   Radiology No results found.  Procedures Procedures (including critical care time)  Medications Ordered in UC Medications  Tdap (BOOSTRIX ) injection 0.5 mL (0.5 mLs Intramuscular Given 12/22/23 0849)    Initial Impression / Assessment and Plan / UC Course  I have reviewed the triage vital signs and the nursing notes.  Pertinent labs & imaging results that were available during my care of the patient were reviewed by me and considered in my medical decision making (see chart for details).    Cellulitis due to insect bite of left forearm.  Afebrile and vital signs are  stable.  Treating with Bactrim .  Education provided on cellulitis and insect bites.  Tetanus updated today.  Instructed patient to follow-up with her PCP tomorrow.  ED precautions given.  She agrees to plan of care.  Final Clinical Impressions(s) / UC Diagnoses   Final diagnoses:  Cellulitis of left forearm  Insect bite of left forearm, initial encounter     Discharge Instructions      Take the antibiotic as directed.  Follow up with your primary care provider tomorrow.  Go to the emergency department if you have worsening symptoms.        ED Prescriptions     Medication Sig Dispense Auth. Provider   sulfamethoxazole -trimethoprim  (BACTRIM  DS) 800-160 MG tablet Take 1 tablet by mouth 2 (two) times daily for 7 days. 14 tablet Corlis Burnard DEL, NP      PDMP not reviewed this encounter.   Corlis Burnard DEL, NP 12/22/23 [phone]

## 2023-12-23 ENCOUNTER — Encounter: Payer: Self-pay | Admitting: Family Medicine

## 2024-01-07 ENCOUNTER — Ambulatory Visit: Admitting: Sports Medicine

## 2024-01-07 VITALS — BP 124/74 | Ht 65.5 in | Wt 124.0 lb

## 2024-01-07 DIAGNOSIS — G5753 Tarsal tunnel syndrome, bilateral lower limbs: Secondary | ICD-10-CM

## 2024-01-07 MED ORDER — GABAPENTIN 600 MG PO TABS
600.0000 mg | ORAL_TABLET | Freq: Three times a day (TID) | ORAL | 3 refills | Status: AC
Start: 2024-01-07 — End: ?

## 2024-01-07 NOTE — Progress Notes (Addendum)
 PCP: Avelina Greig BRAVO, MD  Subjective:   HPI: Patient is a 78 y.o. female here for recheck of her orthotics as well as a refill on her gabapentin .  Patient had orthotics made in September 2024.  She states that they are still comfortable and functioning well.  She denies any new pains or problems.  She still has her chronic bilateral tarsal tunnel syndrome and the gabapentin  has been helpful with this.  She is nearly out of her gabapentin .  Past Medical History:  Diagnosis Date   Allergies    Allergy    Arthritis    Cataract    bilat removed    Complication of anesthesia    slow to wake   COVID-19 virus infection 06/2020   HOH (hard of hearing)    wearing bilateral hearing aids   Hyperlipidemia    Osteoporosis     Current Outpatient Medications on File Prior to Visit  Medication Sig Dispense Refill   acetaminophen  (TYLENOL ) 500 MG tablet Take 1,000 mg by mouth 2 (two) times daily as needed for moderate pain.     aspirin  EC 81 MG tablet Take 81 mg by mouth daily. Swallow whole.     atorvastatin  (LIPITOR) 20 MG tablet TAKE 1 TABLET BY MOUTH EVERYDAY AT BEDTIME 90 tablet 3   Calcium  Carbonate-Vitamin D  (CALCIUM  600+D PO) Take 1 tablet by mouth 2 (two) times daily.     Cholecalciferol  (VITAMIN D -3) 1000 UNITS CAPS Take 1,000 Units by mouth daily.      Coenzyme Q10 (COQ-10) 100 MG capsule Take 100 mg by mouth daily.     denosumab  (PROLIA ) 60 MG/ML SOSY injection Inject 60 mg into the skin every 6 (six) months.     dextromethorphan-guaiFENesin (MUCINEX DM) 30-600 MG 12hr tablet Take 1 tablet by mouth 2 (two) times daily as needed for cough. (Patient not taking: Reported on 12/22/2023)     diphenhydramine -acetaminophen  (TYLENOL  PM) 25-500 MG TABS tablet Take 1 tablet by mouth at bedtime as needed (sleep).     fluticasone  (FLONASE ) 50 MCG/ACT nasal spray Place 2 sprays into both nostrils daily. 16 g 6   ibuprofen (ADVIL,MOTRIN) 200 MG tablet Take 600 mg by mouth 2 (two) times daily as  needed for headache or moderate pain.     Multiple Vitamins-Minerals (CENTRUM SILVER ADULT 50+ PO) Take 1 tablet by mouth daily.     Propylene Glycol (SYSTANE BALANCE OP) Apply to eye in the morning, at noon, in the evening, and at bedtime.     Psyllium (METAMUCIL FIBER PO) Take 1 Dose by mouth every evening.     Current Facility-Administered Medications on File Prior to Visit  Medication Dose Route Frequency Provider Last Rate Last Admin   denosumab  (PROLIA ) injection 60 mg  60 mg Subcutaneous Q6 months Bedsole, Amy E, MD       denosumab  (PROLIA ) injection 60 mg  60 mg Subcutaneous Once Bedsole, Amy E, MD       denosumab  (PROLIA ) injection 60 mg  60 mg Subcutaneous Once Bedsole, Amy E, MD       [START ON 05/07/2024] denosumab  (PROLIA ) injection 60 mg  60 mg Subcutaneous Once Bedsole, Amy E, MD        BP 124/74   Ht 5' 5.5 (1.664 m)   Wt 124 lb (56.2 kg)   BMI 20.32 kg/m        Objective:   Physical Exam:  Gen: NAD, comfortable in exam room Bilateral feet Inspection: Resting pronation bilaterally, cavus foot,  bunionette present at the fifth toe, navicular drop at tarsal tunnel bilaterally, patient has skin findings consistent with tarsal tunnel nerve changes, splaying of her 1st and 2nd toe present, positive Tinel's of the bilateral tarsal tunnel  Patient's orthotics are semirigid with fast-track EVA base applied with bilateral heel wedges as well as MT medium pad on the left and MT cookie on the right.  Orthotics still have good padding and have maintained a good shape.  Assessment/Plan:   Yesenia Jones is a 78 y.o. female who was seen today for the following: 1. Tarsal tunnel syndrome of both lower extremities (Primary) - gabapentin  (NEURONTIN ) 600 MG tablet; Take 1 tablet (600 mg total) by mouth 3 (three) times daily.  Dispense: 270 tablet; Refill: 3 - Examinations of the patient's orthotics still show good padding and support - Patient states that orthotics have been  comfortable  - Refill gabapentin  600 mg 3 times a day for bilateral tarsal tunnel - Follow-up as needed  Follow-up/Education:   Return if symptoms worsen or fail to improve.   May return sooner as needed and encouraged to call/e-mail for additional questions or  worsening symptoms in the interim.  Krystal Lowing, DO Sports Medicine Fellow 01/07/2024 9:47 AM

## 2024-04-07 ENCOUNTER — Other Ambulatory Visit (HOSPITAL_COMMUNITY): Payer: Self-pay

## 2024-04-07 ENCOUNTER — Telehealth: Payer: Self-pay

## 2024-04-07 NOTE — Telephone Encounter (Signed)
 Pt ready for scheduling for PROLIA  on or after : 05/07/24  Option# 1: Buy/Bill (Office supplied medication)  Out-of-pocket cost due at time of clinic visit: $357  Number of injection/visits approved: 2  Primary: [employer] Prolia  co-insurance: 20% Admin fee co-insurance: 20%  Secondary: --- Prolia  co-insurance:  Admin fee co-insurance:   Medical Benefit Details: Date Benefits were checked: 04/07/24 Deductible: NO/ Coinsurance: 20%/ Admin Fee: 20%  Prior Auth: APPROVED PA# [id number] Expiration Date: 10/08/23-10/07/24  # of doses approved: 2 ----------------------------------------------------------------------- Option# 2- Med Obtained from pharmacy:  Pharmacy benefit: Copay $645.79 (Paid to pharmacy) Admin Fee: 20% (Pay at clinic)  Prior Auth: N/A PA# Expiration Date:   # of doses approved:   If patient wants fill through the pharmacy benefit please send prescription to: [facility], and include estimated need by date in rx notes. Pharmacy will ship medication directly to the office.  Patient NOT eligible for Prolia  Copay Card. Copay Card can make patient's cost as little as $25. Link to apply: [email or web address]  ** This summary of benefits is an estimation of the patient's out-of-pocket cost. Exact cost may very based on individual plan coverage.

## 2024-04-07 NOTE — Telephone Encounter (Signed)
 Yesenia Jones

## 2024-04-07 NOTE — Telephone Encounter (Signed)
 Prolia  VOB initiated via [email or web address]  Next Prolia  inj DUE: 05/07/24

## 2024-04-28 ENCOUNTER — Telehealth: Payer: Self-pay | Admitting: *Deleted

## 2024-04-28 ENCOUNTER — Other Ambulatory Visit: Payer: Self-pay | Admitting: *Deleted

## 2024-04-28 DIAGNOSIS — M810 Age-related osteoporosis without current pathological fracture: Secondary | ICD-10-CM

## 2024-04-28 NOTE — Telephone Encounter (Signed)
 Spoke with pt and she has been scheduled for her prolia . See referral note.

## 2024-04-28 NOTE — Telephone Encounter (Signed)
 Copied from CRM #[id number]. Topic: Appointments - Appointment Scheduling >> Apr 28, 2024 11:42 AM Franky GRADE wrote: Patient/patient representative is calling to schedule an appointment. Refer to attachments for appointment information. Patient called to schedule her next prolia  injection along with the labs that are usually done; however, no active orders on file.

## 2024-05-01 ENCOUNTER — Other Ambulatory Visit

## 2024-05-01 ENCOUNTER — Ambulatory Visit: Payer: Self-pay | Admitting: Family Medicine

## 2024-05-01 DIAGNOSIS — M810 Age-related osteoporosis without current pathological fracture: Secondary | ICD-10-CM

## 2024-05-01 LAB — BASIC METABOLIC PANEL WITH GFR
BUN: 16 mg/dL (ref 6–23)
CO2: 28 meq/L (ref 19–32)
Calcium: 9.8 mg/dL (ref 8.4–10.5)
Chloride: 105 meq/L (ref 96–112)
Creatinine, Ser: 0.79 mg/dL (ref 0.40–1.20)
GFR: 72.25 mL/min
Glucose, Bld: 92 mg/dL (ref 70–99)
Potassium: 4.2 meq/L (ref 3.5–5.1)
Sodium: 139 meq/L (ref 135–145)

## 2024-05-12 ENCOUNTER — Ambulatory Visit

## 2024-05-12 DIAGNOSIS — M810 Age-related osteoporosis without current pathological fracture: Secondary | ICD-10-CM | POA: Diagnosis not present

## 2024-05-12 MED ORDER — DENOSUMAB 60 MG/ML ~~LOC~~ SOSY: 60.0000 mg | PREFILLED_SYRINGE | Freq: Once | SUBCUTANEOUS | Status: AC

## 2024-05-12 MED ORDER — DENOSUMAB 60 MG/ML ~~LOC~~ SOSY
60.0000 mg | PREFILLED_SYRINGE | Freq: Once | SUBCUTANEOUS | Status: AC
  Administered 2024-05-12: 60 mg via SUBCUTANEOUS

## 2024-05-12 NOTE — Progress Notes (Signed)
 Per orders of Dr. Jacques Copland, injection of prolia  60 mg Natoma given by Laray Arenas in left arm. Patient tolerated injection well. Patient will make appointment for 6 month.

## 2024-07-21 ENCOUNTER — Other Ambulatory Visit

## 2024-07-23 ENCOUNTER — Ambulatory Visit

## 2024-07-24 ENCOUNTER — Ambulatory Visit

## 2024-07-28 ENCOUNTER — Encounter: Admitting: Family Medicine
